# Patient Record
Sex: Female | Born: 1950 | Race: White | Hispanic: No | Smoking: Former smoker
Health system: Southern US, Community
[De-identification: ages and names within clinical notes are randomized; demographics above are authoritative.]

## PROBLEM LIST (undated history)

## (undated) DIAGNOSIS — M48062 Spinal stenosis, lumbar region with neurogenic claudication: Secondary | ICD-10-CM

## (undated) DIAGNOSIS — Z972 Presence of dental prosthetic device (complete) (partial): Secondary | ICD-10-CM

## (undated) DIAGNOSIS — L4050 Arthropathic psoriasis, unspecified: Secondary | ICD-10-CM

## (undated) DIAGNOSIS — H269 Unspecified cataract: Secondary | ICD-10-CM

## (undated) DIAGNOSIS — I2510 Atherosclerotic heart disease of native coronary artery without angina pectoris: Secondary | ICD-10-CM

## (undated) DIAGNOSIS — D649 Anemia, unspecified: Secondary | ICD-10-CM

## (undated) DIAGNOSIS — I700 Atherosclerosis of aorta: Secondary | ICD-10-CM

## (undated) DIAGNOSIS — E785 Hyperlipidemia, unspecified: Secondary | ICD-10-CM

## (undated) DIAGNOSIS — F419 Anxiety disorder, unspecified: Secondary | ICD-10-CM

## (undated) DIAGNOSIS — F32A Depression, unspecified: Secondary | ICD-10-CM

## (undated) DIAGNOSIS — I519 Heart disease, unspecified: Secondary | ICD-10-CM

## (undated) DIAGNOSIS — R718 Other abnormality of red blood cells: Secondary | ICD-10-CM

## (undated) DIAGNOSIS — Z79620 Long term (current) use of immunosuppressive biologic: Secondary | ICD-10-CM

## (undated) DIAGNOSIS — K589 Irritable bowel syndrome without diarrhea: Secondary | ICD-10-CM

## (undated) DIAGNOSIS — I5181 Takotsubo syndrome: Secondary | ICD-10-CM

## (undated) DIAGNOSIS — I10 Essential (primary) hypertension: Secondary | ICD-10-CM

## (undated) DIAGNOSIS — M1990 Unspecified osteoarthritis, unspecified site: Secondary | ICD-10-CM

## (undated) DIAGNOSIS — G4700 Insomnia, unspecified: Secondary | ICD-10-CM

## (undated) DIAGNOSIS — D569 Thalassemia, unspecified: Secondary | ICD-10-CM

## (undated) DIAGNOSIS — K219 Gastro-esophageal reflux disease without esophagitis: Secondary | ICD-10-CM

## (undated) DIAGNOSIS — E663 Overweight: Secondary | ICD-10-CM

## (undated) DIAGNOSIS — R42 Dizziness and giddiness: Secondary | ICD-10-CM

## (undated) DIAGNOSIS — R911 Solitary pulmonary nodule: Secondary | ICD-10-CM

## (undated) DIAGNOSIS — M2550 Pain in unspecified joint: Secondary | ICD-10-CM

## (undated) HISTORY — PX: TONSILLECTOMY: SUR1361

## (undated) HISTORY — DX: Hyperlipidemia, unspecified: E78.5

## (undated) HISTORY — DX: Essential (primary) hypertension: I10

## (undated) HISTORY — DX: Overweight: E66.3

## (undated) HISTORY — DX: Pain in unspecified joint: M25.50

## (undated) HISTORY — DX: Gastro-esophageal reflux disease without esophagitis: K21.9

## (undated) HISTORY — DX: Anemia, unspecified: D64.9

## (undated) HISTORY — DX: Other abnormality of red blood cells: R71.8

## (undated) HISTORY — DX: Thalassemia, unspecified: D56.9

## (undated) HISTORY — DX: Solitary pulmonary nodule: R91.1

## (undated) HISTORY — DX: Unspecified osteoarthritis, unspecified site: M19.90

## (undated) HISTORY — DX: Insomnia, unspecified: G47.00

## (undated) HISTORY — DX: Heart disease, unspecified: I51.9

## (undated) HISTORY — DX: Atherosclerotic heart disease of native coronary artery without angina pectoris: I25.10

---
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
## 1998-08-12 HISTORY — PX: CHOLECYSTECTOMY: SHX55

## 2005-08-12 HISTORY — PX: CARPAL TUNNEL RELEASE: SHX101

## 2008-08-12 HISTORY — PX: CORONARY ANGIOPLASTY WITH STENT PLACEMENT: SHX49

## 2011-08-13 HISTORY — PX: CATARACT EXTRACTION: SUR2

## 2014-02-08 LAB — HM PAP SMEAR

## 2014-02-21 HISTORY — PX: ESOPHAGOGASTRODUODENOSCOPY: SHX1529

## 2014-02-21 LAB — HM COLONOSCOPY

## 2014-03-11 ENCOUNTER — Ambulatory Visit: Payer: Self-pay | Admitting: Gastroenterology

## 2014-03-11 HISTORY — PX: COLONOSCOPY: SHX174

## 2015-05-16 DIAGNOSIS — I10 Essential (primary) hypertension: Secondary | ICD-10-CM | POA: Diagnosis not present

## 2015-05-16 DIAGNOSIS — E782 Mixed hyperlipidemia: Secondary | ICD-10-CM | POA: Diagnosis not present

## 2015-05-16 DIAGNOSIS — F411 Generalized anxiety disorder: Secondary | ICD-10-CM | POA: Diagnosis not present

## 2015-05-16 DIAGNOSIS — F5104 Psychophysiologic insomnia: Secondary | ICD-10-CM | POA: Diagnosis not present

## 2015-05-16 DIAGNOSIS — I2510 Atherosclerotic heart disease of native coronary artery without angina pectoris: Secondary | ICD-10-CM | POA: Diagnosis not present

## 2015-05-19 DIAGNOSIS — M189 Osteoarthritis of first carpometacarpal joint, unspecified: Secondary | ICD-10-CM | POA: Diagnosis not present

## 2015-05-19 DIAGNOSIS — M6588 Other synovitis and tenosynovitis, other site: Secondary | ICD-10-CM | POA: Diagnosis not present

## 2015-05-19 DIAGNOSIS — M1811 Unilateral primary osteoarthritis of first carpometacarpal joint, right hand: Secondary | ICD-10-CM | POA: Diagnosis not present

## 2015-06-02 DIAGNOSIS — Z23 Encounter for immunization: Secondary | ICD-10-CM | POA: Diagnosis not present

## 2015-06-09 ENCOUNTER — Ambulatory Visit: Payer: Self-pay | Admitting: Family Medicine

## 2015-06-12 ENCOUNTER — Ambulatory Visit: Payer: Self-pay | Admitting: Family Medicine

## 2015-06-12 DIAGNOSIS — E785 Hyperlipidemia, unspecified: Secondary | ICD-10-CM | POA: Insufficient documentation

## 2015-06-12 DIAGNOSIS — I2510 Atherosclerotic heart disease of native coronary artery without angina pectoris: Secondary | ICD-10-CM | POA: Insufficient documentation

## 2015-06-12 DIAGNOSIS — I10 Essential (primary) hypertension: Secondary | ICD-10-CM | POA: Insufficient documentation

## 2015-06-12 DIAGNOSIS — I519 Heart disease, unspecified: Secondary | ICD-10-CM | POA: Insufficient documentation

## 2015-06-12 DIAGNOSIS — M2550 Pain in unspecified joint: Secondary | ICD-10-CM | POA: Insufficient documentation

## 2015-06-12 DIAGNOSIS — G4700 Insomnia, unspecified: Secondary | ICD-10-CM | POA: Insufficient documentation

## 2015-06-12 DIAGNOSIS — D569 Thalassemia, unspecified: Secondary | ICD-10-CM | POA: Insufficient documentation

## 2015-06-14 ENCOUNTER — Encounter: Payer: Self-pay | Admitting: Family Medicine

## 2015-06-14 ENCOUNTER — Ambulatory Visit (INDEPENDENT_AMBULATORY_CARE_PROVIDER_SITE_OTHER): Payer: Medicare Other | Admitting: Family Medicine

## 2015-06-14 VITALS — BP 134/74 | HR 67 | Temp 98.7°F | Ht 62.0 in | Wt 156.0 lb

## 2015-06-14 DIAGNOSIS — D569 Thalassemia, unspecified: Secondary | ICD-10-CM

## 2015-06-14 DIAGNOSIS — I5181 Takotsubo syndrome: Secondary | ICD-10-CM

## 2015-06-14 DIAGNOSIS — R1013 Epigastric pain: Secondary | ICD-10-CM

## 2015-06-14 DIAGNOSIS — I10 Essential (primary) hypertension: Secondary | ICD-10-CM

## 2015-06-14 DIAGNOSIS — E785 Hyperlipidemia, unspecified: Secondary | ICD-10-CM

## 2015-06-14 DIAGNOSIS — Z5181 Encounter for therapeutic drug level monitoring: Secondary | ICD-10-CM | POA: Diagnosis not present

## 2015-06-14 DIAGNOSIS — G4700 Insomnia, unspecified: Secondary | ICD-10-CM | POA: Diagnosis not present

## 2015-06-14 DIAGNOSIS — I2510 Atherosclerotic heart disease of native coronary artery without angina pectoris: Secondary | ICD-10-CM | POA: Diagnosis not present

## 2015-06-14 DIAGNOSIS — R5383 Other fatigue: Secondary | ICD-10-CM | POA: Diagnosis not present

## 2015-06-14 NOTE — Patient Instructions (Addendum)
Do not take over-the-counter Prilosec or Prevacid (it will cause your clopidogrel to not work as well) Try Activia or similar yogurt Return for fasting labs in the next several days  If you need something for aches or pains, try to use Tylenol (acetaminphen) instead of non-steroidals (which include Aleve, ibuprofen, Advil, Motrin, and naproxen); non-steroidals can cause long-term kidney damage and heart attacks  Try to limit saturated fats in your diet (bologna, hot dogs, barbeque, cheeseburgers, hamburgers, steak, bacon, sausage, cheese, etc.) and get more fresh fruits, vegetables, and whole grains  Return the stool cards at your earliest convenience  Try to avoid spicy foods, peppermint, onions, orange juice, tomato-based foods  Try practicing yoga or other mindfulness for relaxation   Steps to Elicit the Relaxation Response The following is the technique reprinted with permission from Dr. Billie Ruddy book The Relaxation Response pages 162-163 1. Sit quietly in a comfortable position. 2. Close your eyes. 3. Deeply relax all your muscles,  beginning at your feet and progressing up to your face.  Keep them relaxed. 4. Breathe through your nose.  Become aware of your breathing.  As you breathe out, say the word, "one"*,  silently to yourself. For example,  breathe in ... out, "one",- in .. out, "one", etc.  Breathe easily and naturally. 5. Continue for 10 to 20 minutes.  You may open your eyes to check the time, but do not use an alarm.  When you finish, sit quietly for several minutes,  at first with your eyes closed and later with your eyes opened.  Do not stand up for a few minutes. 6. Do not worry about whether you are successful  in achieving a deep level of relaxation.  Maintain a passive attitude and permit relaxation to occur at its own pace.  When distracting thoughts occur,  try to ignore them by not dwelling upon them  and return to repeating "one."  With  practice, the response should come with little effort.  Practice the technique once or twice daily,  but not within two hours after any meal,  since the digestive processes seem to interfere with  the elicitation of the Relaxation Response. * It is better to use a soothing, mellifluous sound, preferably with no meaning. or association, to avoid stimulation of unnecessary thoughts - a mantra.

## 2015-06-14 NOTE — Progress Notes (Signed)
BP 134/74 mmHg  Pulse 67  Temp(Src) 98.7 F (37.1 C)  Ht 5\' 2"  (1.575 m)  Wt 156 lb (70.761 kg)  BMI 28.53 kg/m2  SpO2 97%   Subjective:    Patient ID: Lydia Elliott, female    DOB: 1951-03-06, 64 y.o.   MRN: [id number]  HPI: Lydia Elliott is a 64 y.o. female  Chief Complaint  Patient presents with  . GI Problem    She has thalassimia, has been having stomach issues, would like labs  . Immunizations    She is interested in the shingles and pneumonia vaccine  . OTHER    She has felt more tired lately but also has retired and wonders if it is boredom.   She wanted to get bloodwork to see if medicines are working; she just saw Dr. Ubaldo Glassing; May 16, 2015 note reviewed  Coronary artery disease; sees Dr. Ubaldo Glassing; had stent done in 2010; on dual anti-platelet therapy; has Takutsubo syndrome, cardiomyopathy, "octopus", slows heart rate when under stress, spills the enzyme like having a heart attack but no heart attack; small arteries  Her stomach always bothers her; has the runs, dreaded coming here; if she doesn't eat, she hurts right in the epigastrium; pretty stress-ridden person; certain foods bother her; drinks one or two alcohol drinks per week; no NSAIDs; no blood in stool; EGD and colonoscopy last year; no personal hx of ulcers, might have had ulcerative colitis 50 years ago; doesn't feel like burning; like having to go to the bathroom, comes and goes; she takes Zantac  High cholesterol; no more than 2 eggs; eating fatty foods; on statin  Relevant past medical, surgical, family and social history reviewed and updated as indicated. Interim medical history since our last visit reviewed. Allergies and medications reviewed and updated.  Social History  Substance Use Topics  . Smoking status: Never Smoker   . Smokeless tobacco: Never Used  . Alcohol Use: Yes     Comment: socially    Review of Systems  Per HPI unless specifically indicated above     Objective:    BP 134/74  mmHg  Pulse 67  Temp(Src) 98.7 F (37.1 C)  Ht 5\' 2"  (1.575 m)  Wt 156 lb (70.761 kg)  BMI 28.53 kg/m2  SpO2 97%  Wt Readings from Last 3 Encounters:  06/14/15 156 lb (70.761 kg)  05/23/14 159 lb (72.122 kg)    Physical Exam  Constitutional: She appears well-developed and well-nourished. No distress.  HENT:  Head: Normocephalic and atraumatic.  Eyes: EOM are normal. No scleral icterus.  Neck: No thyromegaly present.  Cardiovascular: Normal rate, regular rhythm and normal heart sounds.   No murmur heard. Pulmonary/Chest: Effort normal and breath sounds normal. No respiratory distress. She has no wheezes.  Abdominal: Soft. Bowel sounds are normal. She exhibits no distension. There is no tenderness. There is no guarding.  Musculoskeletal: Normal range of motion. She exhibits no edema.  Neurological: She is alert. She exhibits normal muscle tone.  Reflex Scores:      Patellar reflexes are 1+ on the right side and 1+ on the left side. Skin: Skin is warm and dry. She is not diaphoretic. No pallor.  Psychiatric: She has a normal mood and affect. Her behavior is normal. Judgment and thought content normal.   Results for orders placed or performed in visit on 06/12/15  HM PAP SMEAR  Result Value Ref Range   HM Pap smear per PP   HM COLONOSCOPY  Result Value Ref  Range   HM Colonoscopy per PP       Assessment & Plan:   Problem List Items Addressed This Visit      Cardiovascular and Mediastinum   Hypertension - Primary    DASH guidelines      CAD (coronary artery disease)    Under the care of cardiologist, Dr. Ubaldo Glassing; ideal LDL under 41      Takotsubo cardiomyopathy    Under the care of cardiologist, Dr. 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  Caution when calling on the phone, as she gets anxious  An after-visit summary was printed and given to the patient at [place].  Please see the patient instructions which may contain other information and recommendations beyond what is mentioned above in the assessment and plan.  Orders Placed This Encounter  Procedures  . H. pylori breath test  . CBC with Differential/Platelet  . Lipid Panel w/o Chol/HDL Ratio  . TSH  . Comprehensive metabolic panel

## 2015-06-14 NOTE — Assessment & Plan Note (Addendum)
Limit eggs, limit saturated fats; modest weight loss; check lipids; ideal LDL under 70 with her coronary artery disease; continue statin

## 2015-06-14 NOTE — Assessment & Plan Note (Addendum)
Patient unsure what type she has; will check CBC, as the patient wonders if anemia might cause fatigue

## 2015-06-14 NOTE — Assessment & Plan Note (Signed)
DASH guidelines 

## 2015-06-18 DIAGNOSIS — Z5181 Encounter for therapeutic drug level monitoring: Secondary | ICD-10-CM | POA: Insufficient documentation

## 2015-06-18 DIAGNOSIS — R1013 Epigastric pain: Secondary | ICD-10-CM | POA: Insufficient documentation

## 2015-06-18 DIAGNOSIS — I5181 Takotsubo syndrome: Secondary | ICD-10-CM | POA: Insufficient documentation

## 2015-06-18 NOTE — Assessment & Plan Note (Signed)
Under the care of cardiologist, Dr. Ubaldo Glassing; ideal LDL under 70

## 2015-06-18 NOTE — Assessment & Plan Note (Signed)
Check TSH, other labs

## 2015-06-18 NOTE — Assessment & Plan Note (Signed)
On zolpidem, prescribed by an outside prescriber

## 2015-06-18 NOTE — Assessment & Plan Note (Addendum)
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

## 2015-06-18 NOTE — Assessment & Plan Note (Signed)
Under the care of cardiologist, Dr. Ubaldo Glassing

## 2015-06-20 ENCOUNTER — Other Ambulatory Visit: Payer: Medicare Other

## 2015-06-20 DIAGNOSIS — R1013 Epigastric pain: Secondary | ICD-10-CM | POA: Diagnosis not present

## 2015-06-20 DIAGNOSIS — R5383 Other fatigue: Secondary | ICD-10-CM

## 2015-06-20 DIAGNOSIS — D569 Thalassemia, unspecified: Secondary | ICD-10-CM

## 2015-06-20 DIAGNOSIS — I2510 Atherosclerotic heart disease of native coronary artery without angina pectoris: Secondary | ICD-10-CM

## 2015-06-20 DIAGNOSIS — E785 Hyperlipidemia, unspecified: Secondary | ICD-10-CM

## 2015-06-20 DIAGNOSIS — Z5181 Encounter for therapeutic drug level monitoring: Secondary | ICD-10-CM

## 2015-06-21 LAB — COMPREHENSIVE METABOLIC PANEL
A/G RATIO: 2 (ref 1.1–2.5)
ALBUMIN: 4.2 g/dL (ref 3.6–4.8)
ALT: 12 IU/L (ref 0–32)
AST: 17 IU/L (ref 0–40)
Alkaline Phosphatase: 71 IU/L (ref 39–117)
BILIRUBIN TOTAL: 0.8 mg/dL (ref 0.0–1.2)
BUN / CREAT RATIO: 20 (ref 11–26)
BUN: 14 mg/dL (ref 8–27)
CHLORIDE: 104 mmol/L (ref 97–106)
CO2: 20 mmol/L (ref 18–29)
Calcium: 8.9 mg/dL (ref 8.7–10.3)
Creatinine, Ser: 0.71 mg/dL (ref 0.57–1.00)
GFR calc non Af Amer: 90 mL/min/{1.73_m2} (ref >59)
GFR, EST AFRICAN AMERICAN: 103 mL/min/{1.73_m2} (ref >59)
Globulin, Total: 2.1 g/dL (ref 1.5–4.5)
Glucose: 100 mg/dL — ABNORMAL HIGH (ref 65–99)
POTASSIUM: 4.5 mmol/L (ref 3.5–5.2)
SODIUM: 139 mmol/L (ref 136–144)
TOTAL PROTEIN: 6.3 g/dL (ref 6.0–8.5)

## 2015-06-21 LAB — TSH: TSH: 2.45 u[IU]/mL (ref 0.450–4.500)

## 2015-06-21 LAB — CBC WITH DIFFERENTIAL/PLATELET
Basophils Absolute: 0.1 10*3/uL (ref 0.0–0.2)
Basos: 1 %
EOS (ABSOLUTE): 0.1 10*3/uL (ref 0.0–0.4)
EOS: 2 %
HEMATOCRIT: 32.3 % — AB (ref 34.0–46.6)
Hemoglobin: 9.9 g/dL — ABNORMAL LOW (ref 11.1–15.9)
IMMATURE GRANS (ABS): 0 10*3/uL (ref 0.0–0.1)
IMMATURE GRANULOCYTES: 0 %
LYMPHS: 22 %
Lymphocytes Absolute: 1.1 10*3/uL (ref 0.7–3.1)
MCH: 20.5 pg — ABNORMAL LOW (ref 26.6–33.0)
MCHC: 30.7 g/dL — ABNORMAL LOW (ref 31.5–35.7)
MCV: 67 fL — ABNORMAL LOW (ref 79–97)
MONOS ABS: 0.4 10*3/uL (ref 0.1–0.9)
Monocytes: 7 %
NEUTROS PCT: 68 %
Neutrophils Absolute: 3.5 10*3/uL (ref 1.4–7.0)
PLATELETS: 215 10*3/uL (ref 150–379)
RBC: 4.84 x10E6/uL (ref 3.77–5.28)
RDW: 15.4 % (ref 12.3–15.4)
WBC: 5.1 10*3/uL (ref 3.4–10.8)

## 2015-06-21 LAB — LIPID PANEL W/O CHOL/HDL RATIO
Cholesterol, Total: 143 mg/dL (ref 100–199)
HDL: 60 mg/dL (ref >39)
LDL Calculated: 70 mg/dL (ref 0–99)
TRIGLYCERIDES: 66 mg/dL (ref 0–149)
VLDL CHOLESTEROL CAL: 13 mg/dL (ref 5–40)

## 2015-07-03 ENCOUNTER — Telehealth: Payer: Self-pay | Admitting: Family Medicine

## 2015-07-03 DIAGNOSIS — D569 Thalassemia, unspecified: Secondary | ICD-10-CM

## 2015-07-03 DIAGNOSIS — D649 Anemia, unspecified: Secondary | ICD-10-CM | POA: Insufficient documentation

## 2015-07-03 DIAGNOSIS — R7301 Impaired fasting glucose: Secondary | ICD-10-CM | POA: Insufficient documentation

## 2015-07-03 DIAGNOSIS — D6489 Other specified anemias: Secondary | ICD-10-CM

## 2015-07-03 NOTE — Telephone Encounter (Signed)
Pt would like a call back regarding lab results. 

## 2015-07-03 NOTE — Assessment & Plan Note (Signed)
Refer to heme

## 2015-07-03 NOTE — Telephone Encounter (Signed)
She has not had the H pylori breath test; she had the upper endoscopy and there was gastritis She has the stool cards and just hasn't had a chance to do those; will return those ASAP She has thalassemia; her mother had this too This hemoglobin is low for her; usually in the 10s; no dark stools; I don't want to hang my hat on the anemia from thalassemia, as she could be losing blood from ulcer or gastritis She is really tired; we'll have her see hematology to see if anything can be done to help boost that number; most folks aren't responsive to iron therapy, but I'd like to see if there is anything they can do Increase the ranitidine to BID I offered CT scan, refer back to upper endoscopy; she declined; reasons to go to ER reviewed I offered something for stress; increase escitalopram to 15 mg daily -------------------- AMY --> patient has not heard about the H pylori breath test; please notify her where to go for this ASAP

## 2015-07-03 NOTE — Telephone Encounter (Signed)
Routing to provider  

## 2015-07-05 NOTE — Telephone Encounter (Signed)
Patient given info and directions to [facility] drawn station for test.

## 2015-07-05 NOTE — Telephone Encounter (Signed)
Left message to call.

## 2015-07-11 ENCOUNTER — Other Ambulatory Visit: Payer: Self-pay

## 2015-07-11 ENCOUNTER — Ambulatory Visit: Payer: Medicare Other | Admitting: Family Medicine

## 2015-07-11 ENCOUNTER — Telehealth: Payer: Self-pay | Admitting: Family Medicine

## 2015-07-11 DIAGNOSIS — R1013 Epigastric pain: Secondary | ICD-10-CM

## 2015-07-11 DIAGNOSIS — D569 Thalassemia, unspecified: Secondary | ICD-10-CM

## 2015-07-11 DIAGNOSIS — R197 Diarrhea, unspecified: Secondary | ICD-10-CM

## 2015-07-11 DIAGNOSIS — R195 Other fecal abnormalities: Secondary | ICD-10-CM | POA: Insufficient documentation

## 2015-07-11 LAB — FECAL OCCULT BLOOD, GUAIAC
SPECIMEN 1: POSITIVE
SPECIMEN 2: NEGATIVE
SPECIMEN 3: POSITIVE

## 2015-07-11 MED ORDER — PANTOPRAZOLE SODIUM 40 MG PO TBEC: 40.0000 mg | DELAYED_RELEASE_TABLET | Freq: Every day | ORAL | Status: DC

## 2015-07-11 NOTE — Assessment & Plan Note (Signed)
Reviewed CBC from Dr. Ubaldo Glassing versus most recent CBC, numbers very similar, actually

## 2015-07-11 NOTE — Assessment & Plan Note (Signed)
Refer back to GI for heme positive stools (2 out of 3); increase from H2 blocker to PPI (on pantoprazole since she is on clopidogrel); discussed risk of ulcer, reasons to go to hospital

## 2015-07-11 NOTE — Telephone Encounter (Signed)
Blood in stool; I talked to patient about this by phone She has thalassemia; hemoglobin usually in the 10 range, now 9.9 She had egd and colonoscopy last year 2015 with Dr. Allen Norris; we'll have her get back in to see him Do listen to your body and go to ER or seek immediate care for bloody stools, worsening abd pain, etc. Avoid triggers, reviewed those in detail Start new pantoprazole; okay to quit H2 blocker (using this PPI since she is on clopidogrel)

## 2015-07-14 ENCOUNTER — Inpatient Hospital Stay: Payer: Medicare Other

## 2015-07-21 ENCOUNTER — Inpatient Hospital Stay: Payer: Medicare Other

## 2015-08-09 DIAGNOSIS — H26491 Other secondary cataract, right eye: Secondary | ICD-10-CM | POA: Diagnosis not present

## 2015-08-11 ENCOUNTER — Inpatient Hospital Stay: Payer: Medicare Other

## 2015-08-11 ENCOUNTER — Inpatient Hospital Stay: Payer: Medicare Other | Attending: Internal Medicine | Admitting: Internal Medicine

## 2015-08-11 VITALS — BP 164/85 | HR 67 | Temp 97.3°F | Resp 18 | Wt 153.7 lb

## 2015-08-11 DIAGNOSIS — R1013 Epigastric pain: Secondary | ICD-10-CM | POA: Insufficient documentation

## 2015-08-11 DIAGNOSIS — I10 Essential (primary) hypertension: Secondary | ICD-10-CM | POA: Diagnosis not present

## 2015-08-11 DIAGNOSIS — Z7982 Long term (current) use of aspirin: Secondary | ICD-10-CM | POA: Insufficient documentation

## 2015-08-11 DIAGNOSIS — K259 Gastric ulcer, unspecified as acute or chronic, without hemorrhage or perforation: Secondary | ICD-10-CM | POA: Diagnosis not present

## 2015-08-11 DIAGNOSIS — I2510 Atherosclerotic heart disease of native coronary artery without angina pectoris: Secondary | ICD-10-CM | POA: Insufficient documentation

## 2015-08-11 DIAGNOSIS — Z79899 Other long term (current) drug therapy: Secondary | ICD-10-CM | POA: Insufficient documentation

## 2015-08-11 DIAGNOSIS — D509 Iron deficiency anemia, unspecified: Secondary | ICD-10-CM | POA: Diagnosis not present

## 2015-08-11 DIAGNOSIS — E785 Hyperlipidemia, unspecified: Secondary | ICD-10-CM | POA: Insufficient documentation

## 2015-08-11 DIAGNOSIS — M1990 Unspecified osteoarthritis, unspecified site: Secondary | ICD-10-CM

## 2015-08-11 DIAGNOSIS — D649 Anemia, unspecified: Secondary | ICD-10-CM

## 2015-08-11 LAB — CBC WITH DIFFERENTIAL/PLATELET
Basophils Absolute: 0.1 10*3/uL (ref 0–0.1)
Basophils Relative: 1 %
EOS ABS: 0.1 10*3/uL (ref 0–0.7)
EOS PCT: 2 %
HCT: 35.3 % (ref 35.0–47.0)
Hemoglobin: 11.1 g/dL — ABNORMAL LOW (ref 12.0–16.0)
LYMPHS ABS: 1.5 10*3/uL (ref 1.0–3.6)
LYMPHS PCT: 23 %
MCH: 20.1 pg — AB (ref 26.0–34.0)
MCHC: 31.4 g/dL — AB (ref 32.0–36.0)
MCV: 64.2 fL — AB (ref 80.0–100.0)
MONO ABS: 0.5 10*3/uL (ref 0.2–0.9)
MONOS PCT: 7 %
Neutro Abs: 4.6 10*3/uL (ref 1.4–6.5)
Neutrophils Relative %: 67 %
PLATELETS: 251 10*3/uL (ref 150–440)
RBC: 5.51 MIL/uL — ABNORMAL HIGH (ref 3.80–5.20)
RDW: 15.1 % — ABNORMAL HIGH (ref 11.5–14.5)
WBC: 6.8 10*3/uL (ref 3.6–11.0)

## 2015-08-11 LAB — FERRITIN: FERRITIN: 303 ng/mL (ref 11–307)

## 2015-08-11 NOTE — Progress Notes (Addendum)
[place] @ [facility] Telephone:(336) [phone]  Fax:(336) [place] OB: 12/02/1950  MR#: [id number]  [id number]  Patient Care Team: Arnetha Courser, MD as PCP - General (Family Medicine)  CHIEF COMPLAINT: No chief complaint on file.    No history exists.   microcytic anemia  No flowsheet data found.  HISTORY OF PRESENT ILLNESS:   Lydia Elliott is a 64 year old lady[place] to our clinic for evaluation of microcytic anemia. Lydia Elliott claims that she has "thalassemia", which was diagnosed when she was pregnant with her child. She also claims that her grandmother, her mother and her granddaughter have been diagnosed with thalassemia, however she is not sure of what kind. Most recently, Lydia Elliott has been complaining of epigastric discomfort, and had FOBT was positive last month. Previously she was found to have gastric erosions on EGD, but colonoscopy 2 years ago was normal. She denies any invisible bleeding, change in the color of stool, weight loss, night sweats, lymphadenopathy, nausea, vomiting, diarrhea, dizziness, chest pain, shortness of breath.  REVIEW OF SYSTEMS:   Review of Systems  All other systems reviewed and are negative.    PAST MEDICAL HISTORY: Past Medical History  Diagnosis Date  . GERD (gastroesophageal reflux disease)   . Hypertension   . Hyperlipidemia   . Insomnia   . Polyarthralgia   . CAD (coronary artery disease)   . Thalassemia   . Heart disease     has a stent  . Arthritis     hands, knees, toes  . Chronic anemia   . Microcytosis     Hgb 10.9, MCV 66 April 2015    PAST SURGICAL HISTORY: Past Surgical History  Procedure Laterality Date  . Appendectomy  1967  . Tonsillectomy and adenoidectomy  1969  . Cholecystectomy  2000  . Carpal tunnel release  2007  . Coronary angioplasty with stent placement  2010  . Cataract extraction  2013  . Esophagogastroduodenoscopy   02/21/14    Dr.Wohl  . Colonoscopy  7/31//15    Dr.Wohl    FAMILY HISTORY Family History  Problem Relation Age of Onset  . Cirrhosis Mother   . Hepatitis Mother   . Thalassemia Mother   . Diabetes Mother   . Heart disease Father   . Diabetes Father   . Hyperlipidemia Father   . Hypertension Father   . Stroke Father   . Cancer Sister     lung  . Diabetes Sister   . Stroke Sister     during a surgery  . Diabetes Brother   . Heart disease Brother   . Hypertension Brother   . Stroke Brother   . Thalassemia Maternal Grandmother   . COPD Neg Hx     ADVANCED DIRECTIVES:  No flowsheet data found.  HEALTH MAINTENANCE: Social History  Substance Use Topics  . Smoking status: Never Smoker   . Smokeless tobacco: Never Used  . Alcohol Use: Yes     Comment: socially     No Known Allergies  Current Outpatient Prescriptions  Medication Sig Dispense Refill  . aspirin EC 81 MG tablet Take 81 mg by mouth daily.    . carvedilol (COREG) 12.5 MG tablet Take 12.5 mg by mouth 2 (two) times daily with a meal.    . clopidogrel (PLAVIX) 75 MG tablet Take 75 mg by mouth daily.    Marland Kitchen escitalopram (LEXAPRO) 10 MG tablet Take 15 mg by  mouth daily.    Marland Kitchen lisinopril (PRINIVIL,ZESTRIL) 2.5 MG tablet Take 2.5 mg by mouth daily.    . pantoprazole (PROTONIX) 40 MG tablet Take 1 tablet (40 mg total) by mouth daily. 30 tablet 1  . simvastatin (ZOCOR) 40 MG tablet Take 40 mg by mouth at bedtime.     Marland Kitchen zolpidem (AMBIEN) 10 MG tablet Take 5 mg by mouth at bedtime as needed.      No current facility-administered medications for this visit.    OBJECTIVE:  Filed Vitals:   08/11/15 1338  BP: 164/85  Pulse: 67  Temp: 97.3 F (36.3 C)  Resp: 18     Body mass index is 28.1 kg/(m^2).    ECOG FS:0 - Asymptomatic  Physical Exam  Constitutional: She is oriented to person, place, and time.  Age appropriate Lydia Elliott in no significant distress  HENT:  Head: Normocephalic and atraumatic.  Right  Ear: External ear normal.  Left Ear: External ear normal.  Mouth/Throat: Oropharynx is clear and moist.  Eyes: Conjunctivae are normal. Pupils are equal, round, and reactive to light. Right eye exhibits no discharge. Left eye exhibits no discharge. No scleral icterus.  Neck: Normal range of motion. Neck supple. No JVD present. No tracheal deviation present. No thyromegaly present.  Cardiovascular: Normal rate, regular rhythm, normal heart sounds and intact distal pulses.  Exam reveals no gallop and no friction rub.   No murmur heard. Pulmonary/Chest: Effort normal and breath sounds normal. No stridor. No respiratory distress. She has no wheezes. She has no rales. She exhibits no tenderness.  Abdominal: Soft. Bowel sounds are normal. She exhibits no distension and no mass. There is no tenderness. There is no rebound and no guarding.  Genitourinary:  Postponed  Musculoskeletal: Normal range of motion. She exhibits no edema or tenderness.  Lymphadenopathy:    She has no cervical adenopathy.  Neurological: She is alert and oriented to person, place, and time. She has normal reflexes. No cranial nerve deficit. She exhibits normal muscle tone. Gait normal. Coordination normal. GCS score is 15.  Skin: Skin is warm. No rash noted. She is not diaphoretic. No erythema. No pallor.  Psychiatric: Mood, memory, affect and judgment normal.  Nursing note and vitals reviewed.    LAB RESULTS: Recent Results (from the past 2160 hour(s))  CBC with Differential/Platelet     Status: Abnormal   Collection Time: 06/20/15  8:54 AM  Result Value Ref Range   WBC 5.1 3.4 - 10.8 x10E3/uL   RBC 4.84 3.77 - 5.28 x10E6/uL   Hemoglobin 9.9 (L) 11.1 - 15.9 g/dL   Hematocrit 32.3 (L) 34.0 - 46.6 %   MCV 67 (L) 79 - 97 fL   MCH 20.5 (L) 26.6 - 33.0 pg   MCHC 30.7 (L) 31.5 - 35.7 g/dL   RDW 15.4 12.3 - 15.4 %   Platelets 215 150 - 379 x10E3/uL   Neutrophils 68 %   Lymphs 22 %   Monocytes 7 %   Eos 2 %   Basos 1 %     Neutrophils Absolute 3.5 1.4 - 7.0 x10E3/uL   Lymphocytes Absolute 1.1 0.7 - 3.1 x10E3/uL   Monocytes Absolute 0.4 0.1 - 0.9 x10E3/uL   EOS (ABSOLUTE) 0.1 0.0 - 0.4 x10E3/uL   Basophils Absolute 0.1 0.0 - 0.2 x10E3/uL   Immature Granulocytes 0 %   Immature Grans (Abs) 0.0 0.0 - 0.1 x10E3/uL  Lipid Panel w/o Chol/HDL Ratio     Status: None   Collection Time:  06/20/15  8:54 AM  Result Value Ref Range   Cholesterol, Total 143 100 - 199 mg/dL   Triglycerides 66 0 - 149 mg/dL   HDL 60 >39 mg/dL    Comment: According to ATP-III Guidelines, HDL-C >59 mg/dL is considered a negative risk factor for CHD.    VLDL Cholesterol Cal 13 5 - 40 mg/dL   LDL Calculated 70 0 - 99 mg/dL  TSH     Status: None   Collection Time: 06/20/15  8:54 AM  Result Value Ref Range   TSH 2.450 0.450 - 4.500 uIU/mL  Comprehensive metabolic panel     Status: Abnormal   Collection Time: 06/20/15  8:54 AM  Result Value Ref Range   Glucose 100 (H) 65 - 99 mg/dL   BUN 14 8 - 27 mg/dL   Creatinine, Ser 0.71 0.57 - 1.00 mg/dL   GFR calc non Af Amer 90 >59 mL/min/1.73   GFR calc Af Amer 103 >59 mL/min/1.73   BUN/Creatinine Ratio 20 11 - 26   Sodium 139 136 - 144 mmol/L   Potassium 4.5 3.5 - 5.2 mmol/L   Chloride 104 97 - 106 mmol/L   CO2 20 18 - 29 mmol/L   Calcium 8.9 8.7 - 10.3 mg/dL   Total Protein 6.3 6.0 - 8.5 g/dL   Albumin 4.2 3.6 - 4.8 g/dL   Globulin, Total 2.1 1.5 - 4.5 g/dL   Albumin/Globulin Ratio 2.0 1.1 - 2.5   Bilirubin Total 0.8 0.0 - 1.2 mg/dL   Alkaline Phosphatase 71 39 - 117 IU/L   AST 17 0 - 40 IU/L   ALT 12 0 - 32 IU/L  [place] Stool Card-TAKE HOME     Status: None   Collection Time: 07/11/15 12:00 AM  Result Value Ref Range   Specimen 1 Pos    Specimen 2 Neg    Specimen 3 Pos          ASSESSMENT and MEDICAL DECISION MAKING:  Microcytic 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  Since there has not been a formal confirmation of the diagnosis documented in medical records, we will perform hemoglobin electrophoresis. Her ferritin levels are within normal range, and hemoglobin appears to be significantly  better than previously measured, so no additional evaluation, treatment or follow-up with hematology is required. If patient continues to have low-grade GI bleed, she should be treated with oral iron, if she is able to tolerate it. If, however, she were to develop intolerance of oral iron, then she can be referred to [place] for intravenous iron. Such a referral should be made only if the patient were to develop worsening of anemia with hemoglobin less than 9 g/dL and ferritin less than 20. Patient's baseline hemoglobin is reportedly between 10 and 11 g/dL.  Patient expressed understanding and was in agreement with this plan. She also understands that She can call clinic at any time with any questions, concerns, or complaints.    No matching staging information was found for the patient.  Roxana Hires, MD   08/11/2015 1:13 PM

## 2015-08-11 NOTE — Progress Notes (Signed)
Patient here as new evaluation for Thalassemia. Referred by Dr Sanda Klein.  Patient states she had an endoscopy one year ago that revealed errosion in the top of her stomach. About a month and a half ago she had quiac testing and 2 out of 3 revealed microscopic blood.  She was taking Zantac at that time for discomfort in the epigastric area.  Dr. Sanda Klein changed her from Zantac to Omeprazole and the discomfort in her epigastric area has stopped.  She states she has had no more pain since that time. She further states she has eliminated spicy foods from her diet.

## 2015-08-15 LAB — HEMOGLOBINOPATHY EVALUATION
HGB A2 QUANT: 5 % — AB (ref 0.7–3.1)
HGB F QUANT: 0 % (ref 0.0–2.0)
HGB S QUANTITAION: 0 %
Hgb A: 95 % (ref 94.0–98.0)
Hgb C: 0 %

## 2015-08-17 ENCOUNTER — Ambulatory Visit: Payer: Medicare Other | Admitting: Gastroenterology

## 2015-08-28 DIAGNOSIS — Z961 Presence of intraocular lens: Secondary | ICD-10-CM | POA: Diagnosis not present

## 2015-08-28 DIAGNOSIS — H26491 Other secondary cataract, right eye: Secondary | ICD-10-CM | POA: Diagnosis not present

## 2015-08-28 DIAGNOSIS — H04123 Dry eye syndrome of bilateral lacrimal glands: Secondary | ICD-10-CM | POA: Diagnosis not present

## 2015-08-28 DIAGNOSIS — H35033 Hypertensive retinopathy, bilateral: Secondary | ICD-10-CM | POA: Diagnosis not present

## 2015-09-19 ENCOUNTER — Telehealth: Payer: Self-pay | Admitting: Family Medicine

## 2015-09-19 ENCOUNTER — Ambulatory Visit: Payer: Medicare Other | Admitting: Gastroenterology

## 2015-09-19 NOTE — Telephone Encounter (Signed)
Chart shows that we don't have results of her H pylori breath test Please get that re-ordered / completed as soon as possible please, unless GI has taken over and is seeing her

## 2015-09-21 NOTE — Telephone Encounter (Signed)
Left message to call.

## 2015-09-21 NOTE — Telephone Encounter (Signed)
She just has not had a chance to get it done yet. Had a family emergency and had to go out of town. She will call and get it scheduled.

## 2015-11-27 ENCOUNTER — Ambulatory Visit (INDEPENDENT_AMBULATORY_CARE_PROVIDER_SITE_OTHER): Payer: Medicare Other | Admitting: Family Medicine

## 2015-11-27 ENCOUNTER — Other Ambulatory Visit: Payer: Self-pay

## 2015-11-27 ENCOUNTER — Telehealth: Payer: Self-pay | Admitting: Family Medicine

## 2015-11-27 ENCOUNTER — Telehealth: Payer: Self-pay

## 2015-11-27 ENCOUNTER — Encounter: Payer: Self-pay | Admitting: Family Medicine

## 2015-11-27 VITALS — BP 126/78 | HR 86 | Temp 98.2°F | Resp 14 | Wt 149.0 lb

## 2015-11-27 DIAGNOSIS — R195 Other fecal abnormalities: Secondary | ICD-10-CM

## 2015-11-27 DIAGNOSIS — D569 Thalassemia, unspecified: Secondary | ICD-10-CM | POA: Diagnosis not present

## 2015-11-27 DIAGNOSIS — I10 Essential (primary) hypertension: Secondary | ICD-10-CM | POA: Diagnosis not present

## 2015-11-27 DIAGNOSIS — R1013 Epigastric pain: Secondary | ICD-10-CM

## 2015-11-27 DIAGNOSIS — F418 Other specified anxiety disorders: Secondary | ICD-10-CM

## 2015-11-27 DIAGNOSIS — E663 Overweight: Secondary | ICD-10-CM

## 2015-11-27 MED ORDER — LORAZEPAM 0.5 MG PO TABS: 0.5000 mg | ORAL_TABLET | Freq: Two times a day (BID) | ORAL | Status: DC | PRN

## 2015-11-27 MED ORDER — PANTOPRAZOLE SODIUM 40 MG PO TBEC: 40.0000 mg | DELAYED_RELEASE_TABLET | Freq: Every day | ORAL | Status: DC

## 2015-11-27 NOTE — Progress Notes (Signed)
BP 126/78 mmHg  Pulse 86  Temp(Src) 98.2 F (36.8 C) (Oral)  Resp 14  Wt 149 lb (67.586 kg)  SpO2 96%   Subjective:    Patient ID: Lydia Elliott, female    DOB: November 26, 1950, 65 y.o.   MRN: [id number]  HPI: Lydia Elliott is a 65 y.o. female  Chief Complaint  Patient presents with  . Blood In Stools    3 episodes.  noticed blood in the toliet bowel as well as on toliet paper.   She will have episodes of diarrhea, getting sweaty, stools run right through her; lasts 40 minutes; usually follows no stool for 4-5 days, then that happens; this episode though was different; first time with blood; string of blood; she looked in the toilet, saw brown and a little red; then wiped herself and saw red on toilet paper; three times today total stooling, and only first one had little bit of BRBPR; not tarry stools; no antibiotics in the last 2 months; strong smell to stool; has changed diet quite a bit, eating a lot of fruit; no fever at home; last colonoscopy 2 years ago and they told her to come back in 10 years; hx of hemorrhoids (external) Always has anemia, saw hematologist, nothing to worry about; 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 Ubaldo Glassing, just 5 mg; most nights  Depression screen [facility] 2/9 11/27/2015  Decreased Interest 0  Down, Depressed, Hopeless 0  PHQ - 2 Score 0   Relevant past medical, surgical, family and social history reviewed Past Medical History  Diagnosis Date  . GERD (gastroesophageal reflux disease)   . Hypertension   . Hyperlipidemia   . Insomnia   . Polyarthralgia   . CAD  (coronary artery disease)   . Thalassemia   . Heart disease     has a stent  . Arthritis     hands, knees, toes  . Chronic anemia   . Microcytosis     Hgb 10.9, MCV 66 April 2015  . Wears dentures     partial lower  . Vertigo     none - 10 yrs  . Overweight (BMI 25.0-29.9) 12/23/2015  Past Surg Hx Appy, T&A, choly, coronary artery stent, EGD and colonoscopy July 2015; cataract extraction, carpal tunnel release  Interim medical history since our last visit reviewed. Allergies and medications reviewed  Review of Systems Per HPI unless specifically indicated above Weight loss intentional since November; has done Weight Watchers     Objective:    BP 126/78 mmHg  Pulse 86  Temp(Src) 98.2 F (36.8 C) (Oral)  Resp 14  Wt 149 lb (67.586 kg)  SpO2 96%  Wt Readings from Last 3 Encounters:  12/11/15 146 lb (66.225 kg)  11/27/15 149 lb (67.586 kg)  08/11/15 153 lb 10.6 oz (69.7 kg)   Body mass index is 27.25 kg/(m^2).  Physical Exam  Constitutional: She appears well-developed and well-nourished. No distress.  Weight down 4 pounds over 4-1/2 months  HENT:  Head: Normocephalic and atraumatic.  Eyes: EOM are normal. No scleral icterus.  Neck: No  thyromegaly present.  Cardiovascular: Normal rate, regular rhythm and normal heart sounds.   No murmur heard. Pulmonary/Chest: Effort normal and breath sounds normal. No respiratory distress. She has no wheezes.  Abdominal: Soft. Bowel sounds are normal. She exhibits no distension. There is no tenderness. There is no guarding.  Musculoskeletal: Normal range of motion. She exhibits no edema.  Neurological: She is alert. She exhibits normal muscle tone.  Skin: Skin is warm and dry. She is not diaphoretic. No pallor.  Psychiatric: She has a normal mood and affect. Her behavior is normal. Judgment and thought content normal.      Assessment & Plan:   Problem List Items Addressed This Visit      Cardiovascular and Mediastinum    Hypertension    Controlled today        Other   Abdominal pain, epigastric    advised that since she is on plavix, she needs to stop her current PPI and only use pantoprazole      Anxiety about health    Patient has significant anxiety; will allow very limited amount of anxiolytic; do NOT take with six hours of zolpidem, and do not take with any alcohol      Relevant Medications   LORazepam (ATIVAN) 0.5 MG tablet   Guaiac positive stools - Primary    Patient to have colonoscopy soon      Overweight (BMI 25.0-29.9)    Patient losing weight intentionally she reports; consider checking thyroid tests if ongoing weight loss and/or worsening anxiety      Thalassemia    Chronic, noted         Follow up plan: Return if symptoms worsen or fail to improve.  An after-visit summary was printed and given to the patient at [place].  Please see the patient instructions which may contain other information and recommendations beyond what is mentioned above in the assessment and plan.  Meds ordered this encounter  Medications  . Probiotic Product (PRO-BIOTIC BLEND PO)    Sig: Take by mouth.  Marland Kitchen LORazepam (ATIVAN) 0.5 MG tablet    Sig: Take 1 tablet (0.5 mg total) by mouth 2 (two) times daily as needed for anxiety. Do not take within 6 hours of the zolpidem; no alcohol    Dispense:  10 tablet    Refill:  0  . pantoprazole (PROTONIX) 40 MG tablet    Sig: Take 1 tablet (40 mg total) by mouth daily.    Dispense:  30 tablet    Refill:  1    *patient is on Plavix; do NOT substitute with another PPI please*

## 2015-11-27 NOTE — Telephone Encounter (Signed)
Requesting a return call. She is having rectal bleeding problems and stated that you where going to refer her to do a colonoscopy. Stated that she received a call about a endoscopy. No appointments available today.

## 2015-11-27 NOTE — Patient Instructions (Addendum)
Try taking Miralax daily to have regular soft bowel movements Keep the appointment with gastroenterologist May 1st for your procedures Use the anxiety medicine prior to flight or if really stressed, but never take within six hours of zolpidem (Ambien) or alcohol or pain medicine If your symptoms recur, call me or go to the urgent care or ER depending on severity Call me if needed STOP omeprazole, and use pantoprazole instead

## 2015-11-27 NOTE — Telephone Encounter (Signed)
Gastroenterology Pre-Procedure Review  Request Date: 12/11/15 Requesting Physician: Dr. Enid Derry  PATIENT REVIEW QUESTIONS: The patient responded to the following health history questions as indicated:    1. Are you having any GI issues? yes (Rectal bleeding) 2. Do you have a personal history of Polyps? no 3. Do you have a family history of Colon Cancer or Polyps? no 4. Diabetes Mellitus? no 5. Joint replacements in the past 12 months?no 6. Major health problems in the past 3 months?no 7. Any artificial heart valves, MVP, or defibrillator?yes (Stent x 10 years)    MEDICATIONS & ALLERGIES:    Patient reports the following regarding taking any anticoagulation/antiplatelet therapy:   Plavix, Coumadin, Eliquis, Xarelto, Lovenox, Pradaxa, Brilinta, or Effient? yes (Plavix 75mg ) Aspirin? yes (ASA 81mg )  Patient confirms/reports the following medications:  Current Outpatient Prescriptions  Medication Sig Dispense Refill  . aspirin EC 81 MG tablet Take 81 mg by mouth daily.    . carvedilol (COREG) 12.5 MG tablet Take 12.5 mg by mouth 2 (two) times daily with a meal.    . clopidogrel (PLAVIX) 75 MG tablet Take 75 mg by mouth daily.    Marland Kitchen escitalopram (LEXAPRO) 10 MG tablet Take 15 mg by mouth daily.    Marland Kitchen lactobacillus acidophilus (BACID) TABS tablet Take 2 tablets by mouth daily.    Marland Kitchen lisinopril (PRINIVIL,ZESTRIL) 2.5 MG tablet Take 2.5 mg by mouth daily.    Marland Kitchen omeprazole (PRILOSEC) 20 MG capsule Take 20 mg by mouth 2 (two) times daily.    . pantoprazole (PROTONIX) 40 MG tablet Take 1 tablet (40 mg total) by mouth daily. 30 tablet 1  . simvastatin (ZOCOR) 40 MG tablet Take 40 mg by mouth at bedtime.     . TURMERIC PO Take by mouth daily.    Marland Kitchen zolpidem (AMBIEN) 10 MG tablet Take 5 mg by mouth at bedtime as needed.      No current facility-administered medications for this visit.    Patient confirms/reports the following allergies:  No Known Allergies  No orders of the defined types  were placed in this encounter.    AUTHORIZATION INFORMATION Primary Insurance: 1D#: Group #:  Secondary Insurance: 1D#: Group #:  SCHEDULE INFORMATION: Date: 12/11/15 Time: Location: [place]

## 2015-12-05 ENCOUNTER — Encounter: Payer: Self-pay | Admitting: *Deleted

## 2015-12-08 NOTE — Discharge Instructions (Signed)

## 2015-12-11 ENCOUNTER — Ambulatory Visit: Payer: Medicare Other | Admitting: Student in an Organized Health Care Education/Training Program

## 2015-12-11 ENCOUNTER — Ambulatory Visit
Admission: RE | Admit: 2015-12-11 | Discharge: 2015-12-11 | Disposition: A | Discharge status: Home / Self Care | Payer: Medicare Other | Source: Ambulatory Visit | Attending: Gastroenterology | Admitting: Gastroenterology

## 2015-12-11 ENCOUNTER — Encounter
Admission: RE | Disposition: A | Discharge status: Home / Self Care | Payer: Self-pay | Source: Ambulatory Visit | Attending: Gastroenterology

## 2015-12-11 DIAGNOSIS — Z8379 Family history of other diseases of the digestive system: Secondary | ICD-10-CM | POA: Insufficient documentation

## 2015-12-11 DIAGNOSIS — M170 Bilateral primary osteoarthritis of knee: Secondary | ICD-10-CM | POA: Insufficient documentation

## 2015-12-11 DIAGNOSIS — Z9889 Other specified postprocedural states: Secondary | ICD-10-CM | POA: Insufficient documentation

## 2015-12-11 DIAGNOSIS — Z9049 Acquired absence of other specified parts of digestive tract: Secondary | ICD-10-CM | POA: Insufficient documentation

## 2015-12-11 DIAGNOSIS — M19042 Primary osteoarthritis, left hand: Secondary | ICD-10-CM | POA: Insufficient documentation

## 2015-12-11 DIAGNOSIS — I2510 Atherosclerotic heart disease of native coronary artery without angina pectoris: Secondary | ICD-10-CM | POA: Diagnosis not present

## 2015-12-11 DIAGNOSIS — Z823 Family history of stroke: Secondary | ICD-10-CM | POA: Diagnosis not present

## 2015-12-11 DIAGNOSIS — I10 Essential (primary) hypertension: Secondary | ICD-10-CM | POA: Insufficient documentation

## 2015-12-11 DIAGNOSIS — K625 Hemorrhage of anus and rectum: Secondary | ICD-10-CM | POA: Insufficient documentation

## 2015-12-11 DIAGNOSIS — Z8249 Family history of ischemic heart disease and other diseases of the circulatory system: Secondary | ICD-10-CM | POA: Diagnosis not present

## 2015-12-11 DIAGNOSIS — R1013 Epigastric pain: Secondary | ICD-10-CM | POA: Diagnosis not present

## 2015-12-11 DIAGNOSIS — Z7982 Long term (current) use of aspirin: Secondary | ICD-10-CM | POA: Insufficient documentation

## 2015-12-11 DIAGNOSIS — Z955 Presence of coronary angioplasty implant and graft: Secondary | ICD-10-CM | POA: Insufficient documentation

## 2015-12-11 DIAGNOSIS — Z801 Family history of malignant neoplasm of trachea, bronchus and lung: Secondary | ICD-10-CM | POA: Diagnosis not present

## 2015-12-11 DIAGNOSIS — M19072 Primary osteoarthritis, left ankle and foot: Secondary | ICD-10-CM | POA: Diagnosis not present

## 2015-12-11 DIAGNOSIS — K449 Diaphragmatic hernia without obstruction or gangrene: Secondary | ICD-10-CM | POA: Insufficient documentation

## 2015-12-11 DIAGNOSIS — M19041 Primary osteoarthritis, right hand: Secondary | ICD-10-CM | POA: Insufficient documentation

## 2015-12-11 DIAGNOSIS — K921 Melena: Secondary | ICD-10-CM | POA: Diagnosis not present

## 2015-12-11 DIAGNOSIS — D569 Thalassemia, unspecified: Secondary | ICD-10-CM | POA: Insufficient documentation

## 2015-12-11 DIAGNOSIS — K641 Second degree hemorrhoids: Secondary | ICD-10-CM | POA: Diagnosis not present

## 2015-12-11 DIAGNOSIS — K635 Polyp of colon: Secondary | ICD-10-CM | POA: Insufficient documentation

## 2015-12-11 DIAGNOSIS — E785 Hyperlipidemia, unspecified: Secondary | ICD-10-CM | POA: Diagnosis not present

## 2015-12-11 DIAGNOSIS — D124 Benign neoplasm of descending colon: Secondary | ICD-10-CM | POA: Insufficient documentation

## 2015-12-11 DIAGNOSIS — K219 Gastro-esophageal reflux disease without esophagitis: Secondary | ICD-10-CM | POA: Insufficient documentation

## 2015-12-11 DIAGNOSIS — Z833 Family history of diabetes mellitus: Secondary | ICD-10-CM | POA: Diagnosis not present

## 2015-12-11 DIAGNOSIS — M19071 Primary osteoarthritis, right ankle and foot: Secondary | ICD-10-CM | POA: Insufficient documentation

## 2015-12-11 HISTORY — PX: POLYPECTOMY: SHX5525

## 2015-12-11 HISTORY — PX: COLONOSCOPY WITH PROPOFOL: SHX5780

## 2015-12-11 HISTORY — PX: ESOPHAGOGASTRODUODENOSCOPY (EGD) WITH PROPOFOL: SHX5813

## 2015-12-11 HISTORY — DX: Presence of dental prosthetic device (complete) (partial): Z97.2

## 2015-12-11 HISTORY — DX: Dizziness and giddiness: R42

## 2015-12-11 SURGERY — COLONOSCOPY WITH PROPOFOL
Anesthesia: Monitor Anesthesia Care | Site: Throat | Wound class: Dirty or Infected

## 2015-12-11 MED ORDER — ACETAMINOPHEN 325 MG PO TABS: 325.0000 mg | ORAL_TABLET | ORAL | Status: DC | PRN

## 2015-12-11 MED ORDER — STERILE WATER FOR IRRIGATION IR SOLN
Status: DC | PRN
  Administered 2015-12-11: 12:00:00

## 2015-12-11 MED ORDER — LACTATED RINGERS IV SOLN
INTRAVENOUS | Status: DC
  Administered 2015-12-11: 10:00:00 via INTRAVENOUS

## 2015-12-11 MED ORDER — PROPOFOL 10 MG/ML IV BOLUS
INTRAVENOUS | Status: DC | PRN
  Administered 2015-12-11: 30 mg via INTRAVENOUS
  Administered 2015-12-11: 20 mg via INTRAVENOUS
  Administered 2015-12-11: 30 mg via INTRAVENOUS
  Administered 2015-12-11 (×2): 20 mg via INTRAVENOUS
  Administered 2015-12-11: 30 mg via INTRAVENOUS
  Administered 2015-12-11 (×2): 20 mg via INTRAVENOUS
  Administered 2015-12-11: 30 mg via INTRAVENOUS
  Administered 2015-12-11: 70 mg via INTRAVENOUS
  Administered 2015-12-11 (×3): 20 mg via INTRAVENOUS
  Administered 2015-12-11: 30 mg via INTRAVENOUS
  Administered 2015-12-11 (×5): 20 mg via INTRAVENOUS

## 2015-12-11 MED ORDER — LIDOCAINE HCL (CARDIAC) 20 MG/ML IV SOLN
INTRAVENOUS | Status: DC | PRN
  Administered 2015-12-11: 40 mg via INTRAVENOUS

## 2015-12-11 MED ORDER — GLYCOPYRROLATE 0.2 MG/ML IJ SOLN
INTRAMUSCULAR | Status: DC | PRN
  Administered 2015-12-11: 0.2 mg via INTRAVENOUS

## 2015-12-11 MED ORDER — ACETAMINOPHEN 160 MG/5ML PO SOLN
325.0000 mg | ORAL | Status: DC | PRN
Start: 2015-12-11 — End: 2015-12-11

## 2015-12-11 MED ORDER — SODIUM CHLORIDE 0.9 % IV SOLN: INTRAVENOUS | Status: DC

## 2015-12-11 SURGICAL SUPPLY — 33 items
BALLN DILATOR 10-12 8 (BALLOONS)
BALLN DILATOR 12-15 8 (BALLOONS)
BALLN DILATOR 15-18 8 (BALLOONS)
BALLN DILATOR CRE 0-12 8 (BALLOONS)
BALLN DILATOR ESOPH 8 10 CRE (MISCELLANEOUS) IMPLANT
BALLOON DILATOR 12-15 8 (BALLOONS) IMPLANT
BALLOON DILATOR 15-18 8 (BALLOONS) IMPLANT
BALLOON DILATOR CRE 0-12 8 (BALLOONS) IMPLANT
BLOCK BITE 60FR ADLT L/F GRN (MISCELLANEOUS) ×4 IMPLANT
CANISTER SUCT 1200ML W/VALVE (MISCELLANEOUS) ×4 IMPLANT
CLIP HMST 235XBRD CATH ROT (MISCELLANEOUS) IMPLANT
CLIP RESOLUTION 360 11X235 (MISCELLANEOUS)
FCP ESCP3.2XJMB 240X2.8X (MISCELLANEOUS)
FORCEPS BIOP RAD 4 LRG CAP 4 (CUTTING FORCEPS) ×4 IMPLANT
FORCEPS BIOP RJ4 240 W/NDL (MISCELLANEOUS)
FORCEPS ESCP3.2XJMB 240X2.8X (MISCELLANEOUS) IMPLANT
GOWN CVR UNV OPN BCK APRN NK (MISCELLANEOUS) ×4 IMPLANT
GOWN ISOL THUMB LOOP REG UNIV (MISCELLANEOUS) ×4
INJECTOR VARIJECT VIN23 (MISCELLANEOUS) IMPLANT
KIT DEFENDO VALVE AND CONN (KITS) IMPLANT
KIT ENDO PROCEDURE OLY (KITS) ×4 IMPLANT
MARKER SPOT ENDO TATTOO 5ML (MISCELLANEOUS) IMPLANT
PAD GROUND ADULT SPLIT (MISCELLANEOUS) IMPLANT
PROBE APC STR FIRE (PROBE) IMPLANT
SNARE SHORT THROW 13M SML OVAL (MISCELLANEOUS) IMPLANT
SNARE SHORT THROW 30M LRG OVAL (MISCELLANEOUS) IMPLANT
SNARE SNG USE RND 15MM (INSTRUMENTS) IMPLANT
SPOT EX ENDOSCOPIC TATTOO (MISCELLANEOUS)
SYR INFLATION 60ML (SYRINGE) IMPLANT
TRAP ETRAP POLY (MISCELLANEOUS) IMPLANT
VARIJECT INJECTOR VIN23 (MISCELLANEOUS)
WATER STERILE IRR 250ML POUR (IV SOLUTION) ×4 IMPLANT
WIRE CRE 18-20MM 8CM F G (MISCELLANEOUS) IMPLANT

## 2015-12-11 NOTE — Anesthesia Preprocedure Evaluation (Signed)
Anesthesia Evaluation  Patient identified by MRN, date of birth, ID band  Reviewed: Allergy & Precautions, H&P , NPO status , Patient's Chart, lab work & pertinent test results  Airway Mallampati: II  TM Distance: >3 FB Neck ROM: full    Dental no notable dental hx.    Pulmonary    Pulmonary exam normal        Cardiovascular hypertension, + CAD   Rhythm:regular Rate:Normal     Neuro/Psych    GI/Hepatic GERD  ,  Endo/Other    Renal/GU      Musculoskeletal   Abdominal   Peds  Hematology   Anesthesia Other Findings   Reproductive/Obstetrics                             Anesthesia Physical Anesthesia Plan  ASA: II  Anesthesia Plan: MAC   Post-op Pain Management:    Induction:   Airway Management Planned:   Additional Equipment:   Intra-op Plan:   Post-operative Plan:   Informed Consent: I have reviewed the patients History and Physical, chart, labs and discussed the procedure including the risks, benefits and alternatives for the proposed anesthesia with the patient or authorized representative who has indicated his/her understanding and acceptance.     Plan Discussed with: CRNA  Anesthesia Plan Comments:         Anesthesia Quick Evaluation

## 2015-12-11 NOTE — Anesthesia Procedure Notes (Signed)
Procedure Name: MAC Performed by: Dafina Suk Pre-anesthesia Checklist: Patient identified, Emergency Drugs available, Suction available, Timeout performed and Patient being monitored Patient Re-evaluated:Patient Re-evaluated prior to inductionOxygen Delivery Method: Nasal cannula Placement Confirmation: positive ETCO2       

## 2015-12-11 NOTE — H&P (Signed)
[facility]  [address], [place], [place] [address] Phone: [phone] Fax : [phone]  Primary Care Physician:  Enid Derry, MD Primary Gastroenterologist:  Dr. Allen Norris  Pre-Procedure History & Physical: HPI:  Lydia Elliott is a 65 y.o. female is here for an endoscopy and colonoscopy.   Past Medical History  Diagnosis Date  . GERD (gastroesophageal reflux disease)   . Hypertension   . Hyperlipidemia   . Insomnia   . Polyarthralgia   . CAD (coronary artery disease)   . Thalassemia   . Heart disease     has a stent  . Arthritis     hands, knees, toes  . Chronic anemia   . Microcytosis     Hgb 10.9, MCV 66 April 2015  . Wears dentures     partial lower  . Vertigo     none - 10 yrs    Past Surgical History  Procedure Laterality Date  . Appendectomy  1967  . Tonsillectomy and adenoidectomy  1969  . Cholecystectomy  2000  . Carpal tunnel release  2007  . Coronary angioplasty with stent placement  2010  . Cataract extraction  2013  . Esophagogastroduodenoscopy  02/21/14    Dr.Yanelli Zapanta  . Colonoscopy  7/31//15    Dr.Daveigh Batty    Prior to Admission medications   Medication Sig Start Date End Date Taking? Authorizing Provider  aspirin EC 81 MG tablet Take 81 mg by mouth daily.   Yes Historical Provider, MD  carvedilol (COREG) 12.5 MG tablet Take 12.5 mg by mouth 2 (two) times daily with a meal.   Yes Historical Provider, MD  clopidogrel (PLAVIX) 75 MG tablet Take 75 mg by mouth daily.   Yes Historical Provider, MD  escitalopram (LEXAPRO) 10 MG tablet Take 10 mg by mouth daily. 05/16/15  Yes Historical Provider, MD  lisinopril (PRINIVIL,ZESTRIL) 2.5 MG tablet Take 2.5 mg by mouth 2 (two) times daily.    Yes Historical Provider, MD  LORazepam (ATIVAN) 0.5 MG tablet Take 1 tablet (0.5 mg total) by mouth 2 (two) times daily as needed for anxiety. Do not take within 6 hours of the zolpidem; no alcohol 11/27/15  Yes Arnetha Courser, MD  pantoprazole (PROTONIX)  40 MG tablet Take 1 tablet (40 mg total) by mouth daily. 11/27/15  Yes Arnetha Courser, MD  Probiotic Product (PRO-BIOTIC BLEND PO) Take by mouth.   Yes Historical Provider, MD  simvastatin (ZOCOR) 40 MG tablet Take 40 mg by mouth at bedtime.    Yes Historical Provider, MD  TURMERIC PO Take by mouth daily.   Yes Historical Provider, MD  zolpidem (AMBIEN) 10 MG tablet Take 5 mg by mouth at bedtime as needed.  05/19/15  Yes Historical Provider, MD    Allergies as of 11/27/2015  . (No Known Allergies)    Family History  Problem Relation Age of Onset  . Cirrhosis Mother   . Hepatitis Mother   . Thalassemia Mother   . Diabetes Mother   . Heart disease Father   . Diabetes Father   . Hyperlipidemia Father   . Hypertension Father   . Stroke Father   . Cancer Sister     lung  . Diabetes Sister   . Stroke Sister     during a surgery  . Diabetes Brother   . Heart disease Brother   . Hypertension Brother   . Stroke Brother   . Thalassemia Maternal Grandmother   . COPD Neg Hx  Social History   Social History  . Marital Status: Married    Spouse Name: N/A  . Number of Children: N/A  . Years of Education: N/A   Occupational History  . Not on file.   Social History Main Topics  . Smoking status: Never Smoker   . Smokeless tobacco: Never Used  . Alcohol Use: 0.6 oz/week    1 Shots of liquor per week     Comment: socially  . Drug Use: No  . Sexual Activity: Not on file   Other Topics Concern  . Not on file   Social History Narrative    Review of Systems: See HPI, otherwise negative ROS  Physical Exam: BP 162/65 mmHg  Pulse 71  Temp(Src) 96.7 F (35.9 C) (Temporal)  Resp 16  Ht 5\' 2"  (1.575 m)  Wt 146 lb (66.225 kg)  BMI 26.70 kg/m2  SpO2 100% General:   Alert,  pleasant and cooperative in NAD Head:  Normocephalic and atraumatic. Neck:  Supple; no masses or thyromegaly. Lungs:  Clear throughout to auscultation.    Heart:  Regular rate and rhythm. Abdomen:   Soft, nontender and nondistended. Normal bowel sounds, without guarding, and without rebound.   Neurologic:  Alert and  oriented x4;  grossly normal neurologically.  Impression/Plan: [name] is here for an endoscopy and colonoscopy to be performed for BRBPR and Epigastric pain.  Risks, benefits, limitations, and alternatives regarding  endoscopy and colonoscopy have been reviewed with the patient.  Questions have been answered.  All parties agreeable.   Ollen Bowl, MD  12/11/2015, 10:33 AM

## 2015-12-11 NOTE — Op Note (Signed)
[facility] Gastroenterology Patient Name: Lydia Elliott Procedure Date: 12/11/2015 11:23 AM MRN: [id number] Account #: [id number] Date of Birth: 01-10-1951 Admit Type: Outpatient Age: 65 Room: [facility] OR ROOM 01 Gender: Female Note Status: Finalized Procedure:            Upper GI endoscopy Indications:          Epigastric abdominal pain Providers:            Lucilla Lame, MD Referring MD:         Arnetha Courser (Referring MD) Medicines:            Propofol per Anesthesia Complications:        No immediate complications. Procedure:            Pre-Anesthesia Assessment:                       - Prior to the procedure, a History and Physical was                        performed, and patient medications and allergies were                        reviewed. The patient's tolerance of previous                        anesthesia was also reviewed. The risks and benefits of                        the procedure and the sedation options and risks were                        discussed with the patient. All questions were                        answered, and informed consent was obtained. Prior                        Anticoagulants: The patient has taken no previous                        anticoagulant or antiplatelet agents. ASA Grade                        Assessment: II - A patient with mild systemic disease.                        After reviewing the risks and benefits, the patient was                        deemed in satisfactory condition to undergo the                        procedure.                       After obtaining informed consent, the endoscope was                        passed under direct vision. Throughout the procedure,  the patient's blood pressure, pulse, and oxygen                        saturations were monitored continuously. The was                        introduced through the mouth, and advanced to the   second part of duodenum. The upper GI endoscopy was                        accomplished without difficulty. The patient tolerated                        the procedure well. Findings:      A small hiatal hernia was present.      The stomach was normal.      The examined duodenum was normal. Impression:           - Small hiatal hernia.                       - Normal stomach.                       - Normal examined duodenum.                       - No specimens collected. Recommendation:       - Continue present medications. Procedure Code(s):    --- Professional ---                       [phone], Esophagogastroduodenoscopy, flexible, transoral;                        diagnostic, including collection of specimen(s) by                        brushing or washing, when performed (separate procedure) Diagnosis Code(s):    --- Professional ---                       R10.13, Epigastric pain CPT copyright 2016 American Medical Association. All rights reserved. The codes documented in this report are preliminary and upon coder review may  be revised to meet current compliance requirements. Lucilla Lame, MD 12/11/2015 11:40:01 AM This report has been signed electronically. Number of Addenda: 0 Note Initiated On: 12/11/2015 11:23 AM      [facility]

## 2015-12-11 NOTE — Transfer of Care (Signed)
Immediate Anesthesia Transfer of Care Note  Patient: Lydia Elliott  Procedure(s) Performed: Procedure(s) with comments: COLONOSCOPY WITH PROPOFOL (N/A) - DESCENDING COLON POLYP  ESOPHAGOGASTRODUODENOSCOPY (EGD) WITH PROPOFOL (N/A) POLYPECTOMY (N/A)  Patient Location: PACU  Anesthesia Type: MAC  Level of Consciousness: awake, alert  and patient cooperative  Airway and Oxygen Therapy: Patient Spontanous Breathing and Patient connected to supplemental oxygen  Post-op Assessment: Post-op Vital signs reviewed, Patient's Cardiovascular Status Stable, Respiratory Function Stable, Patent Airway and No signs of Nausea or vomiting  Post-op Vital Signs: Reviewed and stable  Complications: No apparent anesthesia complications

## 2015-12-11 NOTE — Anesthesia Postprocedure Evaluation (Signed)
Anesthesia Post Note  Patient: Lydia Elliott  Procedure(s) Performed: Procedure(s) (LRB): COLONOSCOPY WITH PROPOFOL (N/A) ESOPHAGOGASTRODUODENOSCOPY (EGD) WITH PROPOFOL (N/A) POLYPECTOMY (N/A)  Patient location during evaluation: PACU Anesthesia Type: MAC Level of consciousness: awake and alert and oriented Pain management: satisfactory to patient Vital Signs Assessment: post-procedure vital signs reviewed and stable Respiratory status: spontaneous breathing, nonlabored ventilation and respiratory function stable Cardiovascular status: blood pressure returned to baseline and stable Postop Assessment: Adequate PO intake and No signs of nausea or vomiting Anesthetic complications: no    Raliegh Ip

## 2015-12-11 NOTE — Op Note (Signed)
[facility] Gastroenterology Patient Name: Lydia Elliott Procedure Date: 12/11/2015 11:41 AM MRN: [id number] Account #: [id number] Date of Birth: November 24, 1950 Admit Type: Outpatient Age: 65 Room: [facility] OR ROOM 01 Gender: Female Note Status: Finalized Procedure:            Colonoscopy Indications:          Hematochezia Providers:            Lucilla Lame, MD Referring MD:         Halina Maidens, MD (Referring MD) Medicines:            Propofol per Anesthesia Complications:        No immediate complications. Procedure:            Pre-Anesthesia Assessment:                       - Prior to the procedure, a History and Physical was                        performed, and patient medications and allergies were                        reviewed. The patient's tolerance of previous                        anesthesia was also reviewed. The risks and benefits of                        the procedure and the sedation options and risks were                        discussed with the patient. All questions were                        answered, and informed consent was obtained. Prior                        Anticoagulants: The patient has taken no previous                        anticoagulant or antiplatelet agents. ASA Grade                        Assessment: II - A patient with mild systemic disease.                        After reviewing the risks and benefits, the patient was                        deemed in satisfactory condition to undergo the                        procedure.                       After obtaining informed consent, the colonoscope was                        passed under direct vision. Throughout the procedure,  the patient's blood pressure, pulse, and oxygen                        saturations were monitored continuously. The Olympus                        CF-HQ190L Colonoscope (S#. [id number]) was introduced                        through the  anus and advanced to the the cecum,                        identified by appendiceal orifice and ileocecal valve.                        The colonoscopy was performed without difficulty. The                        patient tolerated the procedure well. The quality of                        the bowel preparation was excellent. Findings:      The perianal and digital rectal examinations were normal.      A 3 mm polyp was found in the descending colon. The polyp was sessile.       The polyp was removed with a cold biopsy forceps. Resection and       retrieval were complete.      Non-bleeding internal hemorrhoids were found during retroflexion. The       hemorrhoids were Grade II (internal hemorrhoids that prolapse but reduce       spontaneously). Impression:           - One 3 mm polyp in the descending colon, removed with                        a cold biopsy forceps. Resected and retrieved.                       - Non-bleeding internal hemorrhoids. Recommendation:       - Await pathology results. Procedure Code(s):    --- Professional ---                       [phone], Colonoscopy, flexible; with biopsy, single or                        multiple Diagnosis Code(s):    --- Professional ---                       K92.1, Melena (includes Hematochezia)                       D12.4, Benign neoplasm of descending colon CPT copyright 2016 American Medical Association. All rights reserved. The codes documented in this report are preliminary and upon coder review may  be revised to meet current compliance requirements. Lucilla Lame, MD 12/11/2015 11:56:24 AM This report has been signed electronically. Number of Addenda: 0 Note Initiated On: 12/11/2015 11:41 AM Scope Withdrawal Time: 0 hours 6 minutes 19 seconds  Total Procedure Duration: 0 hours 9 minutes 10 seconds  [facility]

## 2015-12-12 ENCOUNTER — Encounter: Payer: Self-pay | Admitting: Gastroenterology

## 2015-12-12 DIAGNOSIS — D124 Benign neoplasm of descending colon: Secondary | ICD-10-CM | POA: Diagnosis not present

## 2015-12-12 DIAGNOSIS — K625 Hemorrhage of anus and rectum: Secondary | ICD-10-CM | POA: Diagnosis not present

## 2015-12-12 DIAGNOSIS — K921 Melena: Secondary | ICD-10-CM | POA: Diagnosis not present

## 2015-12-14 ENCOUNTER — Encounter: Payer: Self-pay | Admitting: Gastroenterology

## 2015-12-17 ENCOUNTER — Encounter: Payer: Self-pay | Admitting: Gastroenterology

## 2015-12-23 ENCOUNTER — Encounter: Payer: Self-pay | Admitting: Family Medicine

## 2015-12-23 DIAGNOSIS — E663 Overweight: Secondary | ICD-10-CM | POA: Insufficient documentation

## 2015-12-23 DIAGNOSIS — F418 Other specified anxiety disorders: Secondary | ICD-10-CM | POA: Insufficient documentation

## 2015-12-23 HISTORY — DX: Overweight: E66.3

## 2015-12-23 NOTE — Assessment & Plan Note (Signed)
Chronic, noted

## 2015-12-23 NOTE — Assessment & Plan Note (Signed)
advised that since she is on plavix, she needs to stop her current PPI and only use pantoprazole

## 2015-12-23 NOTE — Assessment & Plan Note (Signed)
Patient has significant anxiety; will allow very limited amount of anxiolytic; do NOT take with six hours of zolpidem, and do not take with any alcohol

## 2015-12-23 NOTE — Assessment & Plan Note (Signed)
Patient to have colonoscopy soon

## 2015-12-23 NOTE — Assessment & Plan Note (Signed)
Controlled today 

## 2015-12-23 NOTE — Assessment & Plan Note (Signed)
Patient losing weight intentionally she reports; consider checking thyroid tests if ongoing weight loss and/or worsening anxiety

## 2016-01-23 ENCOUNTER — Telehealth: Payer: Self-pay | Admitting: Family Medicine

## 2016-01-23 ENCOUNTER — Ambulatory Visit: Payer: Medicare Other | Admitting: Family Medicine

## 2016-01-23 NOTE — Telephone Encounter (Signed)
Patient wanted to know why would you want her to call her gastroenterologist because you prescribed the medication? She will give them a call and she did cancel the appointment with Dr Ancil Boozer; said she would rather see you instead.

## 2016-01-23 NOTE — Telephone Encounter (Signed)
Patient was on Pantoprazole for about 4 weeks and she stopped taking the meds. She now feels she is having reaction to it. She has developed vomitting and diarrhea and wanted to know if this is a side effect from stopping the medication. She is feeling a little better today and says that she has not did anything since 4 but still would like to get checked out. She is asking that you please give her a call today to discuss this. I scheduled her an appointment with Dr Ancil Boozer for Thursday because I thought is was an acute visit until she explained what was going on. She will cancel the appointment for Thursday if you suggest otherwise.

## 2016-01-23 NOTE — Telephone Encounter (Signed)
I called and reached voicemail; left detailed message I'm out of the office; I was hoping the specialist she sees for GERD could have helped with this so that's why I asked her to call him instead today If serious side effects, go to an urgent care If she suspect side effects because of stopping the medicine, consider restarting the medicine until she can be seen

## 2016-01-23 NOTE — Telephone Encounter (Signed)
I will encourage her to call her gastroenterologist about this right away

## 2016-01-25 ENCOUNTER — Ambulatory Visit: Payer: Medicare Other | Admitting: Family Medicine

## 2016-02-02 DIAGNOSIS — M170 Bilateral primary osteoarthritis of knee: Secondary | ICD-10-CM | POA: Diagnosis not present

## 2016-02-07 DIAGNOSIS — F5104 Psychophysiologic insomnia: Secondary | ICD-10-CM | POA: Diagnosis not present

## 2016-02-07 DIAGNOSIS — I10 Essential (primary) hypertension: Secondary | ICD-10-CM | POA: Diagnosis not present

## 2016-02-07 DIAGNOSIS — E782 Mixed hyperlipidemia: Secondary | ICD-10-CM | POA: Diagnosis not present

## 2016-02-07 DIAGNOSIS — I2510 Atherosclerotic heart disease of native coronary artery without angina pectoris: Secondary | ICD-10-CM | POA: Diagnosis not present

## 2016-02-07 DIAGNOSIS — R42 Dizziness and giddiness: Secondary | ICD-10-CM | POA: Diagnosis not present

## 2016-02-07 DIAGNOSIS — F411 Generalized anxiety disorder: Secondary | ICD-10-CM | POA: Diagnosis not present

## 2016-02-07 DIAGNOSIS — I5181 Takotsubo syndrome: Secondary | ICD-10-CM | POA: Diagnosis not present

## 2016-02-27 DIAGNOSIS — I5181 Takotsubo syndrome: Secondary | ICD-10-CM | POA: Diagnosis not present

## 2016-02-27 DIAGNOSIS — I2510 Atherosclerotic heart disease of native coronary artery without angina pectoris: Secondary | ICD-10-CM | POA: Diagnosis not present

## 2016-03-04 DIAGNOSIS — I2510 Atherosclerotic heart disease of native coronary artery without angina pectoris: Secondary | ICD-10-CM | POA: Diagnosis not present

## 2016-03-04 DIAGNOSIS — I5181 Takotsubo syndrome: Secondary | ICD-10-CM | POA: Diagnosis not present

## 2016-03-04 DIAGNOSIS — E782 Mixed hyperlipidemia: Secondary | ICD-10-CM | POA: Diagnosis not present

## 2016-03-04 DIAGNOSIS — I10 Essential (primary) hypertension: Secondary | ICD-10-CM | POA: Diagnosis not present

## 2016-04-22 ENCOUNTER — Encounter: Payer: Self-pay | Admitting: Family Medicine

## 2016-04-22 ENCOUNTER — Ambulatory Visit (INDEPENDENT_AMBULATORY_CARE_PROVIDER_SITE_OTHER): Payer: Medicare Other | Admitting: Family Medicine

## 2016-04-22 VITALS — BP 118/74 | HR 76 | Temp 98.5°F | Wt 144.0 lb

## 2016-04-22 DIAGNOSIS — F418 Other specified anxiety disorders: Secondary | ICD-10-CM | POA: Diagnosis not present

## 2016-04-22 DIAGNOSIS — I2510 Atherosclerotic heart disease of native coronary artery without angina pectoris: Secondary | ICD-10-CM

## 2016-04-22 DIAGNOSIS — R7301 Impaired fasting glucose: Secondary | ICD-10-CM

## 2016-04-22 DIAGNOSIS — R4589 Other symptoms and signs involving emotional state: Secondary | ICD-10-CM

## 2016-04-22 DIAGNOSIS — R5383 Other fatigue: Secondary | ICD-10-CM | POA: Diagnosis not present

## 2016-04-22 DIAGNOSIS — E785 Hyperlipidemia, unspecified: Secondary | ICD-10-CM | POA: Diagnosis not present

## 2016-04-22 DIAGNOSIS — Z23 Encounter for immunization: Secondary | ICD-10-CM | POA: Diagnosis not present

## 2016-04-22 DIAGNOSIS — R195 Other fecal abnormalities: Secondary | ICD-10-CM | POA: Diagnosis not present

## 2016-04-22 DIAGNOSIS — D569 Thalassemia, unspecified: Secondary | ICD-10-CM

## 2016-04-22 DIAGNOSIS — Z5181 Encounter for therapeutic drug level monitoring: Secondary | ICD-10-CM | POA: Diagnosis not present

## 2016-04-22 DIAGNOSIS — I10 Essential (primary) hypertension: Secondary | ICD-10-CM

## 2016-04-22 DIAGNOSIS — K219 Gastro-esophageal reflux disease without esophagitis: Secondary | ICD-10-CM | POA: Diagnosis not present

## 2016-04-22 MED ORDER — RANITIDINE HCL 300 MG PO TABS: 300.0000 mg | ORAL_TABLET | Freq: Every day | ORAL | 5 refills | Status: DC

## 2016-04-22 NOTE — Assessment & Plan Note (Addendum)
Monitored by hematologist; stable

## 2016-04-22 NOTE — Assessment & Plan Note (Signed)
Check A1c. 

## 2016-04-22 NOTE — Assessment & Plan Note (Signed)
Monitored by Dr. Ubaldo Glassing

## 2016-04-22 NOTE — Assessment & Plan Note (Addendum)
Check lipids fasting on another day; limit saturated fats

## 2016-04-22 NOTE — Assessment & Plan Note (Addendum)
Check TSH; weight loss, "revved up" feeling may reflect thyroid disease

## 2016-04-22 NOTE — Assessment & Plan Note (Signed)
Check labs, sgpt on statin, Cr and K+ on the ACE-I

## 2016-04-22 NOTE — Assessment & Plan Note (Signed)
Controlled; managed by Dr. Ubaldo Glassing; try DASH guidelines

## 2016-04-22 NOTE — Patient Instructions (Addendum)
Caution: prolonged use of proton pump inhibitors like omeprazole (Prilosec), pantoprazole (Protonix), esomeprazole (Nexium), and others like Dexilant and Aciphex may increase your risk of pneumonia, Clostridium difficile colitis, osteoporosis, anemia and other health complications Try to limit or avoid triggers like coffee, caffeinated beverages, onions, chocolate, spicy foods, peppermint, acid foods like pizza, spaghetti sauce, and orange juice Lose weight if you are overweight or obese Try elevating the head of your bed by placing a small wedge between your mattress and box springs to keep acid in the stomach at night instead of coming up into your esophagus Start to wean off of the omeprazole, skip one night the first week, skip two nights the second week Start the ranitidine to help with acid reflux I suggest counseling  12 Ways to Curb Anxiety  ?Anxiety is normal human sensation. It is what helped our ancestors survive the pitfalls of the wilderness. Anxiety is defined as experiencing worry or nervousness about an imminent event or something with an uncertain outcome. It is a feeling experienced by most people at some point in their lives. Anxiety can be triggered by a very personal issue, such as the illness of a loved one, or an event of global proportions, such as a refugee crisis. Some of the symptoms of anxiety are:  Feeling restless.  Having a feeling of impending danger.  Increased heart rate.  Rapid breathing. Sweating.  Shaking.  Weakness or feeling tired.  Difficulty concentrating on anything except the current worry.  Insomnia.  Stomach or bowel problems. What can we do about anxiety we may be feeling? There are many techniques to help manage stress and relax. Here are 12 ways you can reduce your anxiety almost immediately: 1. Turn off the constant feed of information. Take a social media sabbatical. Studies have shown that social media directly contributes to social anxiety.   2. Monitor your television viewing habits. Are you watching shows that are also contributing to your anxiety, such as 24-hour news stations? Try watching something else, or better yet, nothing at all. Instead, listen to music, read an inspirational book or practice a hobby. 3. Eat nutritious meals. Also, don't skip meals and keep healthful snacks on hand. Hunger and poor diet contributes to feeling anxious. 4. Sleep. Sleeping on a regular schedule for at least seven to eight hours a night will do wonders for your outlook when you are awake. 5. Exercise. Regular exercise will help rid your body of that anxious energy and help you get more restful sleep. 6. Try deep (diaphragmatic) breathing. Inhale slowly through your nose for five seconds and exhale through your mouth. 7. Practice acceptance and gratitude. When anxiety hits, accept that there are things out of your control that shouldn't be of immediate concern.  8. Seek out humor. When anxiety strikes, watch a funny video, read jokes or call a friend who makes you laugh. Laughter is healing for our bodies and releases endorphins that are calming. 9. Stay positive. Take the effort to replace negative thoughts with positive ones. Try to see a stressful situation in a positive light. Try to come up with solutions rather than dwelling on the problem. 10. Figure out what triggers your anxiety. Keep a journal and make note of anxious moments and the events surrounding them. This will help you identify triggers you can avoid or even eliminate. 11. Talk to someone. Let a trusted friend, family member or even trained professional know that you are feeling overwhelmed and anxious. Verbalize what you are  feeling and why.  12. Volunteer. If your anxiety is triggered by a crisis on a large scale, become an advocate and work to resolve the problem that is causing you unease. Anxiety is often unwelcome and can become overwhelming. If not kept in check, it can become  a disorder that could require medical treatment. However, if you take the time to care for yourself and avoid the triggers that make you anxious, you will be able to find moments of relaxation and clarity that make your life much more enjoyable.

## 2016-04-22 NOTE — Progress Notes (Signed)
BP 118/74   Pulse 76   Temp 98.5 F (36.9 C)   Wt 144 lb (65.3 kg)   SpO2 94%   BMI 26.34 kg/m    Subjective:    Patient ID: Lydia Elliott, female    DOB: 05/12/1951, 65 y.o.   MRN: [id number]  HPI: Lydia Elliott is a 65 y.o. female  Chief Complaint  Patient presents with  . Anxiety    cardo doc change meds and added meds   Patient here for f/u She went to her cardiologist, Dr. Ubaldo Glassing, and he changed her medicine; she was getting low pressures, he has decreased that and she is feeling better now Since last visit, she says her husband had knee surgery and she was waiting on him, had more stress, but that's getting better now Her stomach was going along with her nerves; alternates constipation and loose stools; had colonoscopy and EGD earlier this year Started having panic attacks; on lexapro Was weepy and just felt awful She is losing weight; feels a little revved up; went on a diet in April, Weight Watches, wanted to lose weight for her birthday, and then kept losing weight; lot going on with his surgery She is feeling better now with stress She had an accident not long ago; she cracked her head and couldn't stop the bleeding; paramedics got it to stop bleeding, that was 6-7 weeks ago They stopped the plavix She is on omeprazole; she does not have Barrett's esophagus She has thalassemia and has seen hematologist; they told her no worries  Depression screen [facility] 2/9 04/22/2016 11/27/2015  Decreased Interest 1 0  Down, Depressed, Hopeless 1 0  PHQ - 2 Score 2 0  Altered sleeping 0 -  Tired, decreased energy 0 -  Change in appetite 0 -  Feeling bad or failure about yourself  3 -  Trouble concentrating 0 -  Moving slowly or fidgety/restless 0 -  Suicidal thoughts 0 -  PHQ-9 Score 5 -  Difficult doing work/chores Not difficult at all -   Relevant past medical, surgical, family and social history reviewed Past Medical History:  Diagnosis Date  . Arthritis    hands, knees,  toes  . CAD (coronary artery disease)   . Chronic anemia   . GERD (gastroesophageal reflux disease)   . Heart disease    has a stent  . Hyperlipidemia   . Hypertension   . Insomnia   . Microcytosis    Hgb 10.9, MCV 66 April 2015  . Overweight (BMI 25.0-29.9) 12/23/2015  . Polyarthralgia   . Thalassemia   . Vertigo    none - 10 yrs  . Wears dentures    partial lower   Past Surgical History:  Procedure Laterality Date  . APPENDECTOMY  1967  . CARPAL TUNNEL RELEASE  2007  . CATARACT EXTRACTION  2013  . CHOLECYSTECTOMY  2000  . COLONOSCOPY  7/31//15   Dr.Wohl  . COLONOSCOPY WITH PROPOFOL N/A 12/11/2015   Procedure: COLONOSCOPY WITH PROPOFOL;  Surgeon: Lucilla Lame, MD;  Location: [place];  Service: Endoscopy;  Laterality: N/A;  DESCENDING COLON POLYP   . CORONARY ANGIOPLASTY WITH STENT PLACEMENT  2010  . ESOPHAGOGASTRODUODENOSCOPY  02/21/14   Dr.Wohl  . ESOPHAGOGASTRODUODENOSCOPY (EGD) WITH PROPOFOL N/A 12/11/2015   Procedure: ESOPHAGOGASTRODUODENOSCOPY (EGD) WITH PROPOFOL;  Surgeon: Lucilla Lame, MD;  Location: [place];  Service: Endoscopy;  Laterality: N/A;  . POLYPECTOMY N/A 12/11/2015   Procedure: POLYPECTOMY;  Surgeon: Lucilla Lame, MD;  Location:  [place];  Service: Endoscopy;  Laterality: N/A;  . TONSILLECTOMY AND ADENOIDECTOMY  1969   Family History  Problem Relation Age of Onset  . Cirrhosis Mother   . Hepatitis Mother   . Thalassemia Mother   . Diabetes Mother   . Heart disease Father   . Diabetes Father   . Hyperlipidemia Father   . Hypertension Father   . Stroke Father   . Cancer Sister     lung  . Diabetes Sister   . Stroke Sister     during a surgery  . Diabetes Brother   . Heart disease Brother   . Hypertension Brother   . Stroke Brother   . Thalassemia Maternal Grandmother   . COPD Neg Hx    Social History  Substance Use Topics  . Smoking status: Never Smoker  . Smokeless tobacco: Never Used  . Alcohol use 0.6  oz/week    1 Shots of liquor per week     Comment: socially   Interim medical history since last visit reviewed. Allergies and medications reviewed  Review of Systems Per HPI unless specifically indicated above     Objective:    BP 118/74   Pulse 76   Temp 98.5 F (36.9 C)   Wt 144 lb (65.3 kg)   SpO2 94%   BMI 26.34 kg/m   Wt Readings from Last 3 Encounters:  04/22/16 144 lb (65.3 kg)  12/11/15 146 lb (66.2 kg)  11/27/15 149 lb (67.6 kg)    Physical Exam  Constitutional: She appears well-developed and well-nourished. No distress.  Weight loss 5 pounds over 5 months  HENT:  Head: Normocephalic and atraumatic.  Eyes: EOM are normal. No scleral icterus.  Neck: No thyromegaly present.  Cardiovascular: Normal rate, regular rhythm and normal heart sounds.   Pulmonary/Chest: Effort normal and breath sounds normal. She has no wheezes.  Abdominal: Soft. Bowel sounds are normal. She exhibits no distension. There is no tenderness. There is no guarding.  Musculoskeletal: Normal range of motion. She exhibits no edema.  Neurological: She is alert. She exhibits normal muscle tone.  Skin: Skin is warm and dry. She is not diaphoretic. No pallor.  Psychiatric: She has a normal mood and affect. Her behavior is normal. Judgment and thought content normal.   Results for orders placed or performed in visit on 08/11/15  CBC with Differential/Platelet  Result Value Ref Range   WBC 6.8 3.6 - 11.0 K/uL   RBC 5.51 (H) 3.80 - 5.20 MIL/uL   Hemoglobin 11.1 (L) 12.0 - 16.0 g/dL   HCT 35.3 35.0 - 47.0 %   MCV 64.2 (L) 80.0 - 100.0 fL   MCH 20.1 (L) 26.0 - 34.0 pg   MCHC 31.4 (L) 32.0 - 36.0 g/dL   RDW 15.1 (H) 11.5 - 14.5 %   Platelets 251 150 - 440 K/uL   Neutrophils Relative % 67 %   Neutro Abs 4.6 1.4 - 6.5 K/uL   Lymphocytes Relative 23 %   Lymphs Abs 1.5 1.0 - 3.6 K/uL   Monocytes Relative 7 %   Monocytes Absolute 0.5 0.2 - 0.9 K/uL   Eosinophils Relative 2 %   Eosinophils Absolute  0.1 0 - 0.7 K/uL   Basophils Relative 1 %   Basophils Absolute 0.1 0 - 0.1 K/uL  Ferritin  Result Value Ref Range   Ferritin 303 11 - 307 ng/mL  Hemoglobinopathy evaluation  Result Value Ref Range   Hgb A2 Quant 5.0 (H) 0.7 -  3.1 %   Hgb F Quant 0.0 0.0 - 2.0 %   Hgb S Quant 0.0 0.0 %   Hgb C 0.0 0.0 %   Hgb A 95.0 94.0 - 98.0 %   Please Note: Comment       Assessment & Plan:   Problem List Items Addressed This Visit      Cardiovascular and Mediastinum   Hypertension - Primary    Controlled; managed by Dr. Ubaldo Glassing; try DASH guidelines      Relevant Medications   carvedilol (COREG) 3.125 MG tablet   CAD (coronary artery disease)    Monitored by Dr. Ubaldo Glassing      Relevant Medications   carvedilol (COREG) 3.125 MG tablet     Digestive   GERD (gastroesophageal reflux disease)    Explained concerns about long-term use of PPIs; will add H2 blocker; avoid triggers; elevate HOB, etc      Relevant Medications   omeprazole (PRILOSEC) 20 MG capsule   ranitidine (ZANTAC) 300 MG tablet     Endocrine   Impaired fasting glucose    Check A1c        Other   Thalassemia    Monitored by hematologist; stable      Medication monitoring encounter    Check labs, sgpt on statin, Cr and K+ on the ACE-I      Relevant Orders   COMPLETE METABOLIC PANEL WITH GFR   Hyperlipidemia    Check lipids fasting on another day; limit saturated fats      Relevant Medications   carvedilol (COREG) 3.125 MG tablet   Other Relevant Orders   Lipid panel   Guaiac positive stools    Recently had EGD and colonoscopy (May 2017)      Fatigue    Check TSH; weight loss, "revved up" feeling may reflect thyroid disease      Relevant Orders   TSH   Anxiety about health    Glad she is doing better; see after visit summary for recommendations given to help with anxiety       Other Visit Diagnoses    Needs flu shot       Relevant Orders   Flu vaccine HIGH DOSE PF (Fluzone High dose) (Completed)        Follow up plan: Return in about 3 months (around 07/22/2016).  An after-visit summary was printed and given to the patient at [facility].  Please see the patient instructions which may contain other information and recommendations beyond what is mentioned above in the assessment and plan.  Meds ordered this encounter  Medications  . carvedilol (COREG) 3.125 MG tablet    Sig: Take by mouth.  Marland Kitchen omeprazole (PRILOSEC) 20 MG capsule    Sig: Take 20 mg by mouth daily.  . ranitidine (ZANTAC) 300 MG tablet    Sig: Take 1 tablet (300 mg total) by mouth at bedtime. For acid reflux    Dispense:  30 tablet    Refill:  5    Orders Placed This Encounter  Procedures  . Flu vaccine HIGH DOSE PF (Fluzone High dose)  . TSH  . COMPLETE METABOLIC PANEL WITH GFR  . Lipid panel

## 2016-04-24 DIAGNOSIS — K219 Gastro-esophageal reflux disease without esophagitis: Secondary | ICD-10-CM | POA: Insufficient documentation

## 2016-04-24 NOTE — Assessment & Plan Note (Signed)
Recently had EGD and colonoscopy (May 2017)

## 2016-04-24 NOTE — Assessment & Plan Note (Signed)
Explained concerns about long-term use of PPIs; will add H2 blocker; avoid triggers; elevate HOB, etc

## 2016-04-24 NOTE — Assessment & Plan Note (Addendum)
Glad she is doing better; see after visit summary for recommendations given to help with anxiety

## 2016-05-10 ENCOUNTER — Other Ambulatory Visit: Payer: Self-pay | Admitting: Family Medicine

## 2016-05-10 DIAGNOSIS — E785 Hyperlipidemia, unspecified: Secondary | ICD-10-CM | POA: Diagnosis not present

## 2016-05-10 DIAGNOSIS — R5383 Other fatigue: Secondary | ICD-10-CM | POA: Diagnosis not present

## 2016-05-10 DIAGNOSIS — Z5181 Encounter for therapeutic drug level monitoring: Secondary | ICD-10-CM | POA: Diagnosis not present

## 2016-05-10 LAB — COMPLETE METABOLIC PANEL WITH GFR
ALBUMIN: 4.4 g/dL (ref 3.6–5.1)
ALT: 10 U/L (ref 6–29)
AST: 13 U/L (ref 10–35)
Alkaline Phosphatase: 66 U/L (ref 33–130)
BUN: 18 mg/dL (ref 7–25)
CO2: 26 mmol/L (ref 20–31)
Calcium: 9 mg/dL (ref 8.6–10.4)
Chloride: 103 mmol/L (ref 98–110)
Creat: 0.76 mg/dL (ref 0.50–0.99)
GFR, EST NON AFRICAN AMERICAN: 82 mL/min (ref >=60)
GFR, Est African American: >89 mL/min (ref >=60)
GLUCOSE: 125 mg/dL — AB (ref 65–99)
POTASSIUM: 4.8 mmol/L (ref 3.5–5.3)
SODIUM: 138 mmol/L (ref 135–146)
Total Bilirubin: 1.2 mg/dL (ref 0.2–1.2)
Total Protein: 7.1 g/dL (ref 6.1–8.1)

## 2016-05-10 LAB — LIPID PANEL
CHOL/HDL RATIO: 2.8 ratio (ref <=5.0)
Cholesterol: 154 mg/dL (ref 125–200)
HDL: 56 mg/dL (ref >=46)
LDL CALC: 82 mg/dL (ref <130)
Triglycerides: 79 mg/dL (ref <150)
VLDL: 16 mg/dL (ref <30)

## 2016-05-10 LAB — TSH: TSH: 2.06 m[IU]/L

## 2016-07-22 ENCOUNTER — Ambulatory Visit: Payer: Medicare Other | Admitting: Family Medicine

## 2016-09-04 DIAGNOSIS — I2510 Atherosclerotic heart disease of native coronary artery without angina pectoris: Secondary | ICD-10-CM | POA: Diagnosis not present

## 2016-09-04 DIAGNOSIS — E782 Mixed hyperlipidemia: Secondary | ICD-10-CM | POA: Diagnosis not present

## 2016-09-04 DIAGNOSIS — F5104 Psychophysiologic insomnia: Secondary | ICD-10-CM | POA: Diagnosis not present

## 2016-09-04 DIAGNOSIS — F411 Generalized anxiety disorder: Secondary | ICD-10-CM | POA: Diagnosis not present

## 2016-09-04 DIAGNOSIS — I5181 Takotsubo syndrome: Secondary | ICD-10-CM | POA: Diagnosis not present

## 2016-09-04 DIAGNOSIS — I10 Essential (primary) hypertension: Secondary | ICD-10-CM | POA: Diagnosis not present

## 2016-09-18 DIAGNOSIS — Z23 Encounter for immunization: Secondary | ICD-10-CM | POA: Diagnosis not present

## 2016-10-01 DIAGNOSIS — M170 Bilateral primary osteoarthritis of knee: Secondary | ICD-10-CM | POA: Diagnosis not present

## 2016-10-01 DIAGNOSIS — M25569 Pain in unspecified knee: Secondary | ICD-10-CM | POA: Insufficient documentation

## 2016-10-15 ENCOUNTER — Emergency Department: Payer: Medicare Other

## 2016-10-15 ENCOUNTER — Emergency Department
Admission: EM | Admit: 2016-10-15 | Discharge: 2016-10-15 | Disposition: A | Discharge status: Home / Self Care | Payer: Medicare Other | Attending: Emergency Medicine | Admitting: Emergency Medicine

## 2016-10-15 DIAGNOSIS — I10 Essential (primary) hypertension: Secondary | ICD-10-CM | POA: Diagnosis not present

## 2016-10-15 DIAGNOSIS — R1013 Epigastric pain: Secondary | ICD-10-CM | POA: Diagnosis not present

## 2016-10-15 DIAGNOSIS — I700 Atherosclerosis of aorta: Secondary | ICD-10-CM | POA: Diagnosis not present

## 2016-10-15 DIAGNOSIS — Z79899 Other long term (current) drug therapy: Secondary | ICD-10-CM | POA: Insufficient documentation

## 2016-10-15 DIAGNOSIS — I2510 Atherosclerotic heart disease of native coronary artery without angina pectoris: Secondary | ICD-10-CM | POA: Insufficient documentation

## 2016-10-15 DIAGNOSIS — Z7982 Long term (current) use of aspirin: Secondary | ICD-10-CM | POA: Insufficient documentation

## 2016-10-15 DIAGNOSIS — R079 Chest pain, unspecified: Secondary | ICD-10-CM | POA: Insufficient documentation

## 2016-10-15 DIAGNOSIS — M898X9 Other specified disorders of bone, unspecified site: Secondary | ICD-10-CM

## 2016-10-15 HISTORY — DX: Other specified disorders of bone, unspecified site: M89.8X9

## 2016-10-15 LAB — TROPONIN I: Troponin I: <0.03 ng/mL (ref <0.03)

## 2016-10-15 LAB — LIPASE, BLOOD: LIPASE: 36 U/L (ref 11–51)

## 2016-10-15 LAB — HEPATIC FUNCTION PANEL
ALBUMIN: 4.2 g/dL (ref 3.5–5.0)
ALT: 16 U/L (ref 14–54)
AST: 18 U/L (ref 15–41)
Alkaline Phosphatase: 60 U/L (ref 38–126)
Bilirubin, Direct: 0.1 mg/dL (ref 0.1–0.5)
Indirect Bilirubin: 0.9 mg/dL (ref 0.3–0.9)
TOTAL PROTEIN: 7.2 g/dL (ref 6.5–8.1)
Total Bilirubin: 1 mg/dL (ref 0.3–1.2)

## 2016-10-15 LAB — BASIC METABOLIC PANEL
Anion gap: 9 (ref 5–15)
BUN: 23 mg/dL — AB (ref 6–20)
CHLORIDE: 101 mmol/L (ref 101–111)
CO2: 25 mmol/L (ref 22–32)
CREATININE: 0.59 mg/dL (ref 0.44–1.00)
Calcium: 9.4 mg/dL (ref 8.9–10.3)
Glucose, Bld: 107 mg/dL — ABNORMAL HIGH (ref 65–99)
POTASSIUM: 4 mmol/L (ref 3.5–5.1)
SODIUM: 135 mmol/L (ref 135–145)

## 2016-10-15 LAB — CBC
HEMATOCRIT: 32.7 % — AB (ref 35.0–47.0)
Hemoglobin: 10.6 g/dL — ABNORMAL LOW (ref 12.0–16.0)
MCH: 21.2 pg — ABNORMAL LOW (ref 26.0–34.0)
MCHC: 32.3 g/dL (ref 32.0–36.0)
MCV: 65.6 fL — ABNORMAL LOW (ref 80.0–100.0)
PLATELETS: 292 10*3/uL (ref 150–440)
RBC: 4.99 MIL/uL (ref 3.80–5.20)
RDW: 14.9 % — AB (ref 11.5–14.5)
WBC: 10 10*3/uL (ref 3.6–11.0)

## 2016-10-15 MED ORDER — GI COCKTAIL ~~LOC~~
30.0000 mL | ORAL | Status: AC
  Administered 2016-10-15: 30 mL via ORAL
  Filled 2016-10-15: qty 30

## 2016-10-15 MED ORDER — IOPAMIDOL (ISOVUE-370) INJECTION 76%
100.0000 mL | Freq: Once | INTRAVENOUS | Status: AC | PRN
  Administered 2016-10-15: 100 mL via INTRAVENOUS

## 2016-10-15 MED ORDER — FAMOTIDINE 20 MG PO TABS
40.0000 mg | ORAL_TABLET | Freq: Once | ORAL | Status: AC
  Administered 2016-10-15: 40 mg via ORAL
  Filled 2016-10-15: qty 2

## 2016-10-15 MED ORDER — LORAZEPAM 2 MG/ML IJ SOLN
1.0000 mg | Freq: Once | INTRAMUSCULAR | Status: AC
  Administered 2016-10-15: 1 mg via INTRAVENOUS
  Filled 2016-10-15: qty 1

## 2016-10-15 NOTE — ED Notes (Signed)
ED Provider at bedside. 

## 2016-10-15 NOTE — ED Notes (Signed)
Pt states my mouth is numb after drinking gi cocktail.  Pt anxious.  States im feeling better   No c hest pain.  nsr on monitor.  Family with pt.  Iv in place

## 2016-10-15 NOTE — ED Triage Notes (Signed)
Pt comes into the ED via EMS with c/o chest pain that radiates into her back with excursion 1hr PTA, EMS gave pt nitro spray x1 and pt took ASA 325mg  PTA, pt states pain was 8/10, now 3/10. Denies SOB, N/V

## 2016-10-15 NOTE — Discharge Instructions (Signed)
Your CT scan does not show any serious cause of your chest pain. Follow up with your doctor for further monitoring of your symptoms. As we discussed, you should have a repeat CT scan of your chest in 6-12 months to monitor the lung nodule that was seen.  Radiology report summary: CTA CHEST: No acute vascular process or acute cardiopulmonary disease. Mild atherosclerosis of the thoracic aorta.   **An incidental finding of potential clinical significance has been found. 11 mm solitary ground-glass nodule RIGHT lower lobe for which follow-up CT at 6-12 months is recommended to confirm persistence. This recommendation follows the consensus statement: Guidelines for Management of Small Pulmonary Nodules Detected on CT Images: From the Fleischner Society 2017; Radiology 2017; 284:228-243.**   CTA ABDOMEN AND PELVIS: No acute vascular process. Moderate atherosclerosis of the abdominal aorta.   Mildly dilated jejunum associated with "misty" mesentery suggesting mild ileus and reactive changes.   Moderate retained large bowel stool without bowel obstruction.

## 2016-10-15 NOTE — ED Provider Notes (Signed)
[facility] Emergency Department Provider Note  ____________________________________________  Time seen: Approximately 6:26 PM  I have reviewed the triage vital signs and the nursing notes.   HISTORY  Chief Complaint Chest Pain    HPI Lydia Elliott is a 66 y.o. female who complains of sudden onset of severe central chest pain radiating straight through to her back that is sharp that started around 2 PM today. Been constant since then. She feels very anxious. She thinks this is probably all due to her anxiety. She's never had pain like this before though. Not exertional, nonpleuritic. No paresthesias or motor weakness. No dizziness.  A [place] food for dinner last night   Past Medical History:  Diagnosis Date  . Arthritis    hands, knees, toes  . CAD (coronary artery disease)   . Chronic anemia   . GERD (gastroesophageal reflux disease)   . Heart disease    has a stent  . Hyperlipidemia   . Hypertension   . Insomnia   . Microcytosis    Hgb 10.9, MCV 66 April 2015  . Overweight (BMI 25.0-29.9) 12/23/2015  . Polyarthralgia   . Thalassemia   . Vertigo    none - 10 yrs  . Wears dentures    partial lower     Patient Active Problem List   Diagnosis Date Noted  . GERD (gastroesophageal reflux disease) 04/24/2016  . Anxiety about health 12/23/2015  . Overweight (BMI 25.0-29.9) 12/23/2015  . Blood in stool   . Benign neoplasm of descending colon   . Guaiac positive stools 07/11/2015  . Impaired fasting glucose 07/03/2015  . Anemia 07/03/2015  . Takotsubo cardiomyopathy 06/18/2015  . Medication monitoring encounter 06/18/2015  . Abdominal pain, epigastric 06/18/2015  . Fatigue 06/14/2015  . Hypertension   . Hyperlipidemia   . Insomnia   . Polyarthralgia   . CAD (coronary artery disease)   . Thalassemia   . Heart disease      Past Surgical History:  Procedure Laterality Date  . APPENDECTOMY  1967  . CARPAL TUNNEL RELEASE  2007  .  CATARACT EXTRACTION  2013  . CHOLECYSTECTOMY  2000  . COLONOSCOPY  7/31//15   Dr.Wohl  . COLONOSCOPY WITH PROPOFOL N/A 12/11/2015   Procedure: COLONOSCOPY WITH PROPOFOL;  Surgeon: Lucilla Lame, MD;  Location: [place];  Service: Endoscopy;  Laterality: N/A;  DESCENDING COLON POLYP   . CORONARY ANGIOPLASTY WITH STENT PLACEMENT  2010  . ESOPHAGOGASTRODUODENOSCOPY  02/21/14   Dr.Wohl  . ESOPHAGOGASTRODUODENOSCOPY (EGD) WITH PROPOFOL N/A 12/11/2015   Procedure: ESOPHAGOGASTRODUODENOSCOPY (EGD) WITH PROPOFOL;  Surgeon: Lucilla Lame, MD;  Location: [place];  Service: Endoscopy;  Laterality: N/A;  . POLYPECTOMY N/A 12/11/2015   Procedure: POLYPECTOMY;  Surgeon: Lucilla Lame, MD;  Location: [place];  Service: Endoscopy;  Laterality: N/A;  . [place]     Prior to Admission medications   Medication Sig Start Date End Date Taking? Authorizing Provider  aspirin EC 81 MG tablet Take 81 mg by mouth daily.   Yes Historical Provider, MD  carvedilol (COREG) 6.25 MG tablet Take 6.25 mg by mouth 2 (two) times daily with a meal.  03/15/16 03/15/17 Yes Historical Provider, MD  escitalopram (LEXAPRO) 10 MG tablet Take 10 mg by mouth daily. 05/16/15  Yes Historical Provider, MD  lisinopril (PRINIVIL,ZESTRIL) 2.5 MG tablet Take 2.5 mg by mouth daily.    Yes Historical Provider, MD  MAGNESIUM PO Take 1 tablet by mouth daily.  Yes Historical Provider, MD  Multiple Vitamin (MULTIVITAMIN) tablet Take 1 tablet by mouth daily.   Yes Historical Provider, MD  omeprazole (PRILOSEC) 20 MG capsule Take 20 mg by mouth daily.   Yes Historical Provider, MD  simvastatin (ZOCOR) 40 MG tablet Take 40 mg by mouth at bedtime.    Yes Historical Provider, MD  TURMERIC PO Take by mouth daily.   Yes Historical Provider, MD  vitamin B-12 (CYANOCOBALAMIN) 1000 MCG tablet Take 1,000 mcg by mouth daily.   Yes Historical Provider, MD  zolpidem (AMBIEN) 10 MG tablet Take 5 mg by mouth at  bedtime as needed.  05/19/15  Yes Historical Provider, MD  LORazepam (ATIVAN) 0.5 MG tablet Take 1 tablet (0.5 mg total) by mouth 2 (two) times daily as needed for anxiety. Do not take within 6 hours of the zolpidem; no alcohol Patient not taking: Reported on 10/15/2016 11/27/15   Arnetha Courser, MD  Probiotic Product (PRO-BIOTIC BLEND PO) Take by mouth.    Historical Provider, MD  ranitidine (ZANTAC) 300 MG tablet Take 1 tablet (300 mg total) by mouth at bedtime. For acid reflux Patient not taking: Reported on 10/15/2016 04/22/16   Arnetha Courser, MD     Allergies Patient has no known allergies.   Family History  Problem Relation Age of Onset  . Cirrhosis Mother   . Hepatitis Mother   . Thalassemia Mother   . Diabetes Mother   . Heart disease Father   . Diabetes Father   . Hyperlipidemia Father   . Hypertension Father   . Stroke Father   . Cancer Sister     lung  . Diabetes Sister   . Stroke Sister     during a surgery  . Diabetes Brother   . Heart disease Brother   . Hypertension Brother   . Stroke Brother   . Thalassemia Maternal Grandmother   . COPD Neg Hx     Social History Social History  Substance Use Topics  . Smoking status: Never Smoker  . Smokeless tobacco: Never Used  . Alcohol use 0.6 oz/week    1 Shots of liquor per week     Comment: socially    Review of Systems  Constitutional:   No fever or chills.  ENT:   No sore throat. No rhinorrhea. Cardiovascular:   Positive as above chest pain. Respiratory:   No dyspnea or cough. Gastrointestinal:   Negative for abdominal pain, vomiting and diarrhea.  Genitourinary:   Negative for dysuria or difficulty urinating. Musculoskeletal:   Negative for focal pain or swelling Neurological:   Negative for headaches 10-point ROS otherwise negative.  ____________________________________________   PHYSICAL EXAM:  VITAL SIGNS: ED Triage Vitals  Enc Vitals Group     BP 10/15/16 1452 (!) 181/71     Pulse Rate  10/15/16 1452 73     Resp 10/15/16 1452 18     Temp 10/15/16 1452 98.5 F (36.9 C)     Temp Source 10/15/16 1452 Oral     SpO2 10/15/16 1452 97 %     Weight 10/15/16 1453 145 lb (65.8 kg)     Height 10/15/16 1453 5\' 2"  (1.575 m)     Head Circumference --      Peak Flow --      Pain Score 10/15/16 1453 3     Pain Loc --      Pain Edu? --      Excl. in [place]? --     Vital  signs reviewed, nursing assessments reviewed.   Constitutional:   Alert and oriented. Well appearing and in no distress. Eyes:   No scleral icterus. No conjunctival pallor. PERRL. EOMI.  No nystagmus. ENT   Head:   Normocephalic and atraumatic.   Nose:   No congestion/rhinnorhea. No septal hematoma   Mouth/Throat:   MMM, no pharyngeal erythema. No peritonsillar mass.    Neck:   No stridor. No SubQ emphysema. No meningismus. Hematological/Lymphatic/Immunilogical:   No cervical lymphadenopathy. Cardiovascular:   RRR. Symmetric bilateral radial and DP pulses.  No murmurs.  Respiratory:   Normal respiratory effort without tachypnea nor retractions. Breath sounds are clear and equal bilaterally. No wheezes/rales/rhonchi. Chest pain not reproducible Gastrointestinal:   Soft and nontender. Non distended. There is no CVA tenderness.  No rebound, rigidity, or guarding.  Genitourinary:   deferred Musculoskeletal:   Normal range of motion in all extremities. No joint effusions.  No lower extremity tenderness.  No edema. Neurologic:   Normal speech and language.  CN 2-10 normal. Motor grossly intact. No gross focal neurologic deficits are appreciated.  Skin:    Skin is warm, dry and intact. No rash noted.  No petechiae, purpura, or bullae.  ____________________________________________    LABS (pertinent positives/negatives) (all labs ordered are listed, but only abnormal results are displayed) Labs Reviewed  BASIC METABOLIC PANEL - Abnormal; Notable for the following:       Result Value   Glucose, Bld 107  (*)    BUN 23 (*)    All other components within normal limits  CBC - Abnormal; Notable for the following:    Hemoglobin 10.6 (*)    HCT 32.7 (*)    MCV 65.6 (*)    MCH 21.2 (*)    RDW 14.9 (*)    All other components within normal limits  TROPONIN I  HEPATIC FUNCTION PANEL  LIPASE, BLOOD  TROPONIN I   ____________________________________________   EKG  Interpreted by me Normal sinus rhythm rate of 69, normal axis. Normal intervals. Poor R-wave progression in anterior precordial leads. Normal ST segments and T waves.  ____________________________________________    RADIOLOGY  Dg Chest 2 View  Result Date: 10/15/2016 CLINICAL DATA:  Chest pain. EXAM: CHEST  2 VIEW COMPARISON:  None. FINDINGS: The lungs are clear wiithout focal pneumonia, edema, pneumothorax or pleural effusion. 6 mm nodular density overlies the anterior right second rib. The cardiopericardial silhouette is within normal limits for size. Nodular density/densities projecting over the lungs are compatible with pads for telemetry leads. The visualized bony structures of the thorax are intact. IMPRESSION: 6 mm nodular density right upper lobe. CT chest without contrast recommended to further evaluate. Electronically Signed   By: Misty Stanley M.D.   On: 10/15/2016 15:33   Ct Angio Abdomen W And/or Wo Contrast  Result Date: 10/15/2016 CLINICAL DATA:  Sudden severe chest and epigastric pain radiating to the back. History of hypertension, hyperlipidemia. EXAM: CT ANGIOGRAPHY CHEST AND ABDOMEN TECHNIQUE: Multidetector CT imaging of the chest and abdomen was performed using the standard protocol during bolus administration of intravenous contrast. Multiplanar CT image reconstructions and MIPs were obtained to evaluate the vascular anatomy. CONTRAST:  100 cc Isovue 370 COMPARISON:  Chest radiograph October 15, 2016 at 1517 hours FINDINGS: CTA CHEST FINDINGS CARDIOVASCULAR: Thoracic aorta is normal course and caliber, mild calcific  atherosclerosis. No intrinsic density on noncontrast CT. Homogeneous contrast opacification of thoracic aorta without dissection, aneurysm, luminal irregularity, periaortic fluid collections, or contrast extravasation. Heart size is  normal. Mild coronary artery calcifications. No pericardial effusion. Though not tailored for evaluation, no central pulmonary embolus. MEDIASTINUM/NODES: No mediastinal mass or lymphadenopathy by CT size criteria. LUNGS/PLEURA: Tracheobronchial tree is patent, no pneumothorax. No pleural effusions, focal consolidations, or masses. 9 mm irregular ground-glass nodule RIGHT lower lobe, axial 27/57. Dependent atelectasis. MUSCULOSKELETAL:  Nonsuspicious.  Bone island RIGHT humeral head. Review of the MIP images confirms the above findings. CTA ABDOMEN AND PELVIS FINDINGS ARTERIES: Abdominal aorta is normal course and caliber, moderate calcific atherosclerosis. Homogeneous contrast opacification of aortoiliac vessels without dissection, aneurysm, luminal irregularity, periaortic fluid collections, or contrast extravasation. Celiac axis, superior and inferior mesenteric arteries are normal. HEPATOBILIARY: Liver, status post cholecystectomy. PANCREAS: Normal. SPLEEN: Normal, heterogeneous enhancement due to arterial bolus timing. ADRENALS/URINARY TRACT: Kidneys are orthotopic, demonstrating symmetric enhancement. RIGHT extra renal pelvis. No nephrolithiasis, hydronephrosis or solid renal masses. Normal adrenal glands. STOMACH/BOWEL: Mildly prominent proximal jejunum at 3.4 cm. The stomach, included large bowel are normal in course and caliber without inflammatory changes. Small amount of small bowel feces compatible with chronic stasis. Moderate retained large bowel stool. Normal appendix. VASCULAR/LYMPHATIC: No lymphadenopathy by CT size criteria. "Misty" mesentery with a few small reactive lymph nodes. OTHER: No intraperitoneal free fluid or free air. MUSCULOSKELETAL: Nonacute. Osteopenia.  Mild broad lumbar levoscoliosis. Moderate lower lumbar facet arthropathy. Review of the MIP images confirms the above findings. IMPRESSION: CTA CHEST: No acute vascular process or acute cardiopulmonary disease. Mild atherosclerosis of the thoracic aorta. **An incidental finding of potential clinical significance has been found. 11 mm solitary ground-glass nodule RIGHT lower lobe for which follow-up CT at 6-12 months is recommended to confirm persistence. This recommendation follows the consensus statement: Guidelines for Management of Small Pulmonary Nodules Detected on CT Images: From the Fleischner Society 2017; Radiology 2017; 284:228-243.** CTA ABDOMEN AND PELVIS: No acute vascular process. Moderate atherosclerosis of the abdominal aorta. Mildly dilated jejunum associated with "misty" mesentery suggesting mild ileus and reactive changes. Moderate retained large bowel stool without bowel obstruction. Electronically Signed   By: Elon Alas M.D.   On: 10/15/2016 18:40   Ct Angio Chest Aorta W And/or Wo Contrast  Result Date: 10/15/2016 CLINICAL DATA:  Sudden severe chest and epigastric pain radiating to the back. History of hypertension, hyperlipidemia. EXAM: CT ANGIOGRAPHY CHEST AND ABDOMEN TECHNIQUE: Multidetector CT imaging of the chest and abdomen was performed using the standard protocol during bolus administration of intravenous contrast. Multiplanar CT image reconstructions and MIPs were obtained to evaluate the vascular anatomy. CONTRAST:  100 cc Isovue 370 COMPARISON:  Chest radiograph October 15, 2016 at 1517 hours FINDINGS: CTA CHEST FINDINGS CARDIOVASCULAR: Thoracic aorta is normal course and caliber, mild calcific atherosclerosis. No intrinsic density on noncontrast CT. Homogeneous contrast opacification of thoracic aorta without dissection, aneurysm, luminal irregularity, periaortic fluid collections, or contrast extravasation. Heart size is normal. Mild coronary artery calcifications. No  pericardial effusion. Though not tailored for evaluation, no central pulmonary embolus. MEDIASTINUM/NODES: No mediastinal mass or lymphadenopathy by CT size criteria. LUNGS/PLEURA: Tracheobronchial tree is patent, no pneumothorax. No pleural effusions, focal consolidations, or masses. 9 mm irregular ground-glass nodule RIGHT lower lobe, axial 27/57. Dependent atelectasis. MUSCULOSKELETAL:  Nonsuspicious.  Bone island RIGHT humeral head. Review of the MIP images confirms the above findings. CTA ABDOMEN AND PELVIS FINDINGS ARTERIES: Abdominal aorta is normal course and caliber, moderate calcific atherosclerosis. Homogeneous contrast opacification of aortoiliac vessels without dissection, aneurysm, luminal irregularity, periaortic fluid collections, or contrast extravasation. Celiac axis, superior and inferior mesenteric arteries are normal. HEPATOBILIARY:  Liver, status post cholecystectomy. PANCREAS: Normal. SPLEEN: Normal, heterogeneous enhancement due to arterial bolus timing. ADRENALS/URINARY TRACT: Kidneys are orthotopic, demonstrating symmetric enhancement. RIGHT extra renal pelvis. No nephrolithiasis, hydronephrosis or solid renal masses. Normal adrenal glands. STOMACH/BOWEL: Mildly prominent proximal jejunum at 3.4 cm. The stomach, included large bowel are normal in course and caliber without inflammatory changes. Small amount of small bowel feces compatible with chronic stasis. Moderate retained large bowel stool. Normal appendix. VASCULAR/LYMPHATIC: No lymphadenopathy by CT size criteria. "Misty" mesentery with a few small reactive lymph nodes. OTHER: No intraperitoneal free fluid or free air. MUSCULOSKELETAL: Nonacute. Osteopenia. Mild broad lumbar levoscoliosis. Moderate lower lumbar facet arthropathy. Review of the MIP images confirms the above findings. IMPRESSION: CTA CHEST: No acute vascular process or acute cardiopulmonary disease. Mild atherosclerosis of the thoracic aorta. **An incidental finding of  potential clinical significance has been found. 11 mm solitary ground-glass nodule RIGHT lower lobe for which follow-up CT at 6-12 months is recommended to confirm persistence. This recommendation follows the consensus statement: Guidelines for Management of Small Pulmonary Nodules Detected on CT Images: From the Fleischner Society 2017; Radiology 2017; 284:228-243.** CTA ABDOMEN AND PELVIS: No acute vascular process. Moderate atherosclerosis of the abdominal aorta. Mildly dilated jejunum associated with "misty" mesentery suggesting mild ileus and reactive changes. Moderate retained large bowel stool without bowel obstruction. Electronically Signed   By: Elon Alas M.D.   On: 10/15/2016 18:40    ____________________________________________   PROCEDURES Procedures  ____________________________________________   INITIAL IMPRESSION / ASSESSMENT AND PLAN / ED COURSE  Pertinent labs & imaging results that were available during my care of the patient were reviewed by me and considered in my medical decision making (see chart for details).  Patient well appearing not in any distress. Pain is improved after aspirin and nitroglycerin. Pain is very atypical and not consistent with ACS or PE. Aortic dissection is on the differential but currently I think there is a low likelihood. I did discuss performing a CT angiogram with the patient, she declines on initial assessment. Advised her that we would continue to monitor her symptoms in the emergency department for a few hours while waiting to perform a serial troponin. Patient given antiacid medications as well to see if this would help her symptoms.  ----------------------------------------- 6:36 PM on 10/15/2016 -----------------------------------------  Patient reports that she has been passing gas over the past hour and a half to 2 hours. She does feel better. At times pain was resolved, but currently she is still having some significant pain. I  recommended we performed a CT angiogram which she agrees to. Repeat troponin pending as well. If workup is negative I think patient is suitable for outpatient follow-up. She does have questions about changing her Lexapro dose possibly increasing or changing it to another medication. She has been referred to psychiatry in the past which I encouraged her to continue to do.    ----------------------------------------- 7:24 PM on 10/15/2016 -----------------------------------------  CT angiogram negative. Patient informed of incidental finding of pulmonary nodule and follow-up recommendation. Repeat troponin negative. We'll discharge home follow up with primary care. Patient will try taking her Ativan as prescribed for her symptoms. Return precautions given.       ____________________________________________   FINAL CLINICAL IMPRESSION(S) / ED DIAGNOSES  Final diagnoses:  Nonspecific chest pain      Discharge Medication List as of 10/15/2016  7:17 PM       Portions of this note were generated with dragon dictation software. Dictation errors may  occur despite best attempts at proofreading.    Carrie Mew, MD 10/15/16 [id number]

## 2016-10-15 NOTE — ED Notes (Signed)
Patient transported to CT 

## 2016-10-18 ENCOUNTER — Encounter: Payer: Self-pay | Admitting: Family Medicine

## 2016-10-18 ENCOUNTER — Ambulatory Visit (INDEPENDENT_AMBULATORY_CARE_PROVIDER_SITE_OTHER): Payer: Medicare Other | Admitting: Family Medicine

## 2016-10-18 VITALS — BP 144/78 | HR 82 | Temp 98.1°F | Resp 16 | Wt 145.3 lb

## 2016-10-18 DIAGNOSIS — I10 Essential (primary) hypertension: Secondary | ICD-10-CM

## 2016-10-18 DIAGNOSIS — F418 Other specified anxiety disorders: Secondary | ICD-10-CM

## 2016-10-18 DIAGNOSIS — E782 Mixed hyperlipidemia: Secondary | ICD-10-CM | POA: Diagnosis not present

## 2016-10-18 DIAGNOSIS — I2510 Atherosclerotic heart disease of native coronary artery without angina pectoris: Secondary | ICD-10-CM | POA: Diagnosis not present

## 2016-10-18 DIAGNOSIS — Z1159 Encounter for screening for other viral diseases: Secondary | ICD-10-CM

## 2016-10-18 DIAGNOSIS — I700 Atherosclerosis of aorta: Secondary | ICD-10-CM | POA: Diagnosis not present

## 2016-10-18 DIAGNOSIS — Z5181 Encounter for therapeutic drug level monitoring: Secondary | ICD-10-CM | POA: Diagnosis not present

## 2016-10-18 DIAGNOSIS — D6489 Other specified anemias: Secondary | ICD-10-CM | POA: Diagnosis not present

## 2016-10-18 DIAGNOSIS — R4589 Other symptoms and signs involving emotional state: Secondary | ICD-10-CM

## 2016-10-18 DIAGNOSIS — Z1239 Encounter for other screening for malignant neoplasm of breast: Secondary | ICD-10-CM

## 2016-10-18 DIAGNOSIS — Z1231 Encounter for screening mammogram for malignant neoplasm of breast: Secondary | ICD-10-CM | POA: Diagnosis not present

## 2016-10-18 DIAGNOSIS — R7301 Impaired fasting glucose: Secondary | ICD-10-CM | POA: Diagnosis not present

## 2016-10-18 DIAGNOSIS — R911 Solitary pulmonary nodule: Secondary | ICD-10-CM | POA: Diagnosis not present

## 2016-10-18 HISTORY — DX: Solitary pulmonary nodule: R91.1

## 2016-10-18 LAB — LIPID PANEL
CHOL/HDL RATIO: 2.2 ratio (ref <5.0)
Cholesterol: 166 mg/dL (ref <200)
HDL: 76 mg/dL (ref >50)
LDL CALC: 78 mg/dL (ref <100)
Triglycerides: 59 mg/dL (ref <150)
VLDL: 12 mg/dL (ref <30)

## 2016-10-18 LAB — HEPATITIS C ANTIBODY: HCV Ab: NEGATIVE

## 2016-10-18 NOTE — Assessment & Plan Note (Signed)
Goal LDL less than 70; followed by Dr. Ubaldo Glassing

## 2016-10-18 NOTE — Patient Instructions (Addendum)
We'll check labs today We'll refer you to the CT surgeon to follow you scan findings If you have not heard anything from my staff in a week about any orders/referrals/studies from today, please contact us here to follow-up [phone] Do talk with Dr. Ubaldo Glassing about your blood pressure Try to follow the DASH guidelines (DASH stands for Dietary Approaches to Stop Hypertension) Try to limit the sodium in your diet.  Ideally, consume less than 1.5 grams (less than 1,500mg ) per day. Do not add salt when cooking or at the table.  Check the sodium amount on labels when shopping, and choose items lower in sodium when given a choice. Avoid or limit foods that already contain a lot of sodium. Eat a diet rich in fruits and vegetables and whole grains.    Steps to Elicit the Relaxation Response The following is the technique reprinted with permission from Dr. Billie Ruddy book The Relaxation Response pages 162-163 1. Sit quietly in a comfortable position. 2. Close your eyes. 3. Deeply relax all your muscles,  beginning at your feet and progressing up to your face.  Keep them relaxed. 4. Breathe through your nose.  Become aware of your breathing.  As you breathe out, say the word, "one"*,  silently to yourself. For example,  breathe in ... out, "one",- in .. out, "one", etc.  Breathe easily and naturally. 5. Continue for 10 to 20 minutes.  You may open your eyes to check the time, but do not use an alarm.  When you finish, sit quietly for several minutes,  at first with your eyes closed and later with your eyes opened.  Do not stand up for a few minutes. 6. Do not worry about whether you are successful  in achieving a deep level of relaxation.  Maintain a passive attitude and permit relaxation to occur at its own pace.  When distracting thoughts occur,  try to ignore them by not dwelling upon them  and return to repeating "one."  With practice, the response should come with little effort.   Practice the technique once or twice daily,  but not within two hours after any meal,  since the digestive processes seem to interfere with  the elicitation of the Relaxation Response. * It is better to use a soothing, mellifluous sound, preferably with no meaning. or association, to avoid stimulation of unnecessary thoughts - a mantra.  DASH Eating Plan DASH stands for "Dietary Approaches to Stop Hypertension." The DASH eating plan is a healthy eating plan that has been shown to reduce high blood pressure (hypertension). It may also reduce your risk for type 2 diabetes, heart disease, and stroke. The DASH eating plan may also help with weight loss. What are tips for following this plan? General guidelines   Avoid eating more than 2,300 mg (milligrams) of salt (sodium) a day. If you have hypertension, you may need to reduce your sodium intake to 1,500 mg a day.  Limit alcohol intake to no more than 1 drink a day for nonpregnant women and 2 drinks a day for men. One drink equals 12 oz of beer, 5 oz of wine, or 1 oz of hard liquor.  Work with your health care provider to maintain a healthy body weight or to lose weight. Ask what an ideal weight is for you.  Get at least 30 minutes of exercise that causes your heart to beat faster (aerobic exercise) most days of the week. Activities may include walking, swimming, or biking.  Work with your health care provider or diet and nutrition specialist (dietitian) to adjust your eating plan to your individual calorie needs. Reading food labels   Check food labels for the amount of sodium per serving. Choose foods with less than 5 percent of the Daily Value of sodium. Generally, foods with less than 300 mg of sodium per serving fit into this eating plan.  To find whole grains, look for the word "whole" as the first word in the ingredient list. Shopping   Buy products labeled as "low-sodium" or "no salt added."  Buy fresh foods. Avoid canned foods  and premade or frozen meals. Cooking   Avoid adding salt when cooking. Use salt-free seasonings or herbs instead of table salt or sea salt. Check with your health care provider or pharmacist before using salt substitutes.  Do not fry foods. Cook foods using healthy methods such as baking, boiling, grilling, and broiling instead.  Cook with heart-healthy oils, such as olive, canola, soybean, or sunflower oil. Meal planning    Eat a balanced diet that includes:  5 or more servings of fruits and vegetables each day. At each meal, try to fill half of your plate with fruits and vegetables.  Up to 6-8 servings of whole grains each day.  Less than 6 oz of lean meat, poultry, or fish each day. A 3-oz serving of meat is about the same size as a deck of cards. One egg equals 1 oz.  2 servings of low-fat dairy each day.  A serving of nuts, seeds, or beans 5 times each week.  Heart-healthy fats. Healthy fats called Omega-3 fatty acids are found in foods such as flaxseeds and coldwater fish, like sardines, salmon, and mackerel.  Limit how much you eat of the following:  Canned or prepackaged foods.  Food that is high in trans fat, such as fried foods.  Food that is high in saturated fat, such as fatty meat.  Sweets, desserts, sugary drinks, and other foods with added sugar.  Full-fat dairy products.  Do not salt foods before eating.  Try to eat at least 2 vegetarian meals each week.  Eat more home-cooked food and less restaurant, buffet, and fast food.  When eating at a restaurant, ask that your food be prepared with less salt or no salt, if possible. What foods are recommended? The items listed may not be a complete list. Talk with your dietitian about what dietary choices are best for you. Grains  Whole-grain or whole-wheat bread. Whole-grain or whole-wheat pasta. Brown rice. [place]. Bulgur. Whole-grain and low-sodium cereals. Pita bread. Low-fat, low-sodium crackers.  Whole-wheat flour tortillas. Vegetables  Fresh or frozen vegetables (raw, steamed, roasted, or grilled). Low-sodium or reduced-sodium tomato and vegetable juice. Low-sodium or reduced-sodium tomato sauce and tomato paste. Low-sodium or reduced-sodium canned vegetables. Fruits  All fresh, dried, or frozen fruit. Canned fruit in natural juice (without added sugar). Meat and other protein foods  Skinless chicken or [place]. Ground chicken or [place]. Pork with fat trimmed off. Fish and seafood. Egg whites. Dried beans, peas, or lentils. Unsalted nuts, nut butters, and seeds. Unsalted canned beans. Lean cuts of beef with fat trimmed off. Low-sodium, lean deli meat. Dairy  Low-fat (1%) or fat-free (skim) milk. Fat-free, low-fat, or reduced-fat cheeses. Nonfat, low-sodium ricotta or cottage cheese. Low-fat or nonfat yogurt. Low-fat, low-sodium cheese. Fats and oils  Soft margarine without trans fats. Vegetable oil. Low-fat, reduced-fat, or light mayonnaise and salad dressings (reduced-sodium). Canola, safflower, olive, soybean, and sunflower  oils. Avocado. Seasoning and other foods  Herbs. Spices. Seasoning mixes without salt. Unsalted popcorn and pretzels. Fat-free sweets. What foods are not recommended? The items listed may not be a complete list. Talk with your dietitian about what dietary choices are best for you. Grains  Baked goods made with fat, such as croissants, muffins, or some breads. Dry pasta or rice meal packs. Vegetables  Creamed or fried vegetables. Vegetables in a cheese sauce. Regular canned vegetables (not low-sodium or reduced-sodium). Regular canned tomato sauce and paste (not low-sodium or reduced-sodium). Regular tomato and vegetable juice (not low-sodium or reduced-sodium). Angie Fava. Olives. Fruits  Canned fruit in a light or heavy syrup. Fried fruit. Fruit in cream or butter sauce. Meat and other protein foods  Fatty cuts of meat. Ribs. Fried meat. Berniece Salines. Sausage. Bologna and  other processed lunch meats. Salami. Fatback. Hotdogs. Bratwurst. Salted nuts and seeds. Canned beans with added salt. Canned or smoked fish. Whole eggs or egg yolks. Chicken or [place] with skin. Dairy  Whole or 2% milk, cream, and half-and-half. Whole or full-fat cream cheese. Whole-fat or sweetened yogurt. Full-fat cheese. Nondairy creamers. Whipped toppings. Processed cheese and cheese spreads. Fats and oils  Butter. Stick margarine. Lard. Shortening. Ghee. Bacon fat. Tropical oils, such as coconut, palm kernel, or palm oil. Seasoning and other foods  Salted popcorn and pretzels. Onion salt, garlic salt, seasoned salt, table salt, and sea salt. Worcestershire sauce. Tartar sauce. Barbecue sauce. Teriyaki sauce. Soy sauce, including reduced-sodium. Steak sauce. Canned and packaged gravies. Fish sauce. Oyster sauce. Cocktail sauce. Horseradish that you find on the shelf. Ketchup. Mustard. Meat flavorings and tenderizers. Bouillon cubes. Hot sauce and Tabasco sauce. Premade or packaged marinades. Premade or packaged taco seasonings. Relishes. Regular salad dressings. Where to find more information:  National Heart, Lung, and [place]: https://wilson-eaton.com/  American Heart Association: www.heart.org Summary  The DASH eating plan is a healthy eating plan that has been shown to reduce high blood pressure (hypertension). It may also reduce your risk for type 2 diabetes, heart disease, and stroke.  With the DASH eating plan, you should limit salt (sodium) intake to 2,300 mg a day. If you have hypertension, you may need to reduce your sodium intake to 1,500 mg a day.  When on the DASH eating plan, aim to eat more fresh fruits and vegetables, whole grains, lean proteins, low-fat dairy, and heart-healthy fats.  Work with your health care provider or diet and nutrition specialist (dietitian) to adjust your eating plan to your individual calorie needs. This information is not intended to replace advice  given to you by your health care provider. Make sure you discuss any questions you have with your health care provider. Document Released: 07/18/2011 Document Revised: 07/22/2016 Document Reviewed: 07/22/2016 Elsevier Interactive Patient Education  2017 [employer].

## 2016-10-18 NOTE — Assessment & Plan Note (Addendum)
Referral to CT surgeon; she'll see psychiatrist soon; continue SSRI; I demonstrated relaxation response and gave her info, encouraged meditation; use the benzo for only truly stormy moments (she has five left from last April she says, no new Rx given)

## 2016-10-18 NOTE — Assessment & Plan Note (Signed)
Check labs 

## 2016-10-18 NOTE — Assessment & Plan Note (Signed)
Managed by Dr. Ubaldo Glassing; try DASH guidelines

## 2016-10-18 NOTE — Assessment & Plan Note (Signed)
Check glucose and A1c today; try to limit sweets

## 2016-10-18 NOTE — Assessment & Plan Note (Signed)
Check lipids today; goal LDL is less than 70; avoid saturated fats

## 2016-10-18 NOTE — Assessment & Plan Note (Signed)
Refer to CT surgeon for management of the lung nodule, especially with her sister's history

## 2016-10-18 NOTE — Assessment & Plan Note (Signed)
Chronic; last 3 CBCs reviewed

## 2016-10-18 NOTE — Assessment & Plan Note (Signed)
Goal LDL is less than 70; healthy diet, activity, weight loss; continue aspirin

## 2016-10-18 NOTE — Progress Notes (Signed)
BP (!) 144/78   Pulse 82   Temp 98.1 F (36.7 C) (Oral)   Resp 16   Wt 145 lb 5 oz (65.9 kg)   SpO2 97%   BMI 26.58 kg/m    Subjective:    Patient ID: Lydia Elliott, female    DOB: [birth year], 66 y.o.   MRN: [id number]  HPI: Lydia Elliott is a 66 y.o. female  Chief Complaint  Patient presents with  . Hospitalization Follow-up    Chest pain, back pain and BP was 215/85. DX Anxiety.   . Blood Sugar Problem    Runs in the family; Would like it checked   Patient is here for ER follow-up  She was in the ER recently for chest pain; turned out to be anxiety; her BP was really high then; 215/85 They gave her medicine to bring it dow She sees cardiologist, Dr. Ubaldo Glassing, and she called the nurse just the other day about her visit and her nodule He has been adjusting her blood pressure medicine; he thought she might have been over-medicated; she'll f/u with him about her BP  She is going to see the psychiatrist on May 1st She is very anxious about this diagnosis of the lung nodule She was given ativan from last April and has taken just a half of a pill when she flies; she could have used one the other day but was afraid to just take it; has five left  CLINICAL DATA:  Sudden severe chest and epigastric pain radiating to the back. History of hypertension, hyperlipidemia.  EXAM: CT ANGIOGRAPHY CHEST AND ABDOMEN  TECHNIQUE: Multidetector CT imaging of the chest and abdomen was performed using the standard protocol during bolus administration of intravenous contrast. Multiplanar CT image reconstructions and MIPs were obtained to evaluate the vascular anatomy.  CONTRAST:  100 cc Isovue 370  COMPARISON:  Chest radiograph October 15, 2016 at 1517 hours  FINDINGS: CTA CHEST FINDINGS  CARDIOVASCULAR: Thoracic aorta is normal course and caliber, mild calcific atherosclerosis. No intrinsic density on noncontrast CT. Homogeneous contrast opacification of thoracic aorta  without dissection, aneurysm, luminal irregularity, periaortic fluid collections, or contrast extravasation. Heart size is normal. Mild coronary artery calcifications. No pericardial effusion. Though not tailored for evaluation, no central pulmonary embolus.  MEDIASTINUM/NODES: No mediastinal mass or lymphadenopathy by CT size criteria.  LUNGS/PLEURA: Tracheobronchial tree is patent, no pneumothorax. No pleural effusions, focal consolidations, or masses. 9 mm irregular ground-glass nodule RIGHT lower lobe, axial 27/57. Dependent atelectasis.  MUSCULOSKELETAL:  Nonsuspicious.  Bone island RIGHT humeral head.  Review of the MIP images confirms the above findings.  CTA ABDOMEN AND PELVIS FINDINGS  ARTERIES: Abdominal aorta is normal course and caliber, moderate calcific atherosclerosis. Homogeneous contrast opacification of aortoiliac vessels without dissection, aneurysm, luminal irregularity, periaortic fluid collections, or contrast extravasation. Celiac axis, superior and inferior mesenteric arteries are normal.  HEPATOBILIARY: Liver, status post cholecystectomy.  PANCREAS: Normal.  SPLEEN: Normal, heterogeneous enhancement due to arterial bolus timing.  ADRENALS/URINARY TRACT: Kidneys are orthotopic, demonstrating symmetric enhancement. RIGHT extra renal pelvis. No nephrolithiasis, hydronephrosis or solid renal masses. Normal adrenal glands.  STOMACH/BOWEL: Mildly prominent proximal jejunum at 3.4 cm. The stomach, included large bowel are normal in course and caliber without inflammatory changes. Small amount of small bowel feces compatible with chronic stasis. Moderate retained large bowel stool. Normal appendix.  VASCULAR/LYMPHATIC: No lymphadenopathy by CT size criteria. "Misty" mesentery with a few small reactive lymph nodes.  OTHER: No intraperitoneal free fluid or free air.  MUSCULOSKELETAL: Nonacute. Osteopenia. Mild broad lumbar levoscoliosis.  Moderate lower lumbar facet arthropathy.  Review of the MIP images confirms the above findings.  IMPRESSION: CTA CHEST: No acute vascular process or acute cardiopulmonary disease. Mild atherosclerosis of the thoracic aorta.  **An incidental finding of potential clinical significance has been found. 11 mm solitary ground-glass nodule RIGHT lower lobe for which follow-up CT at 6-12 months is recommended to confirm persistence. This recommendation follows the consensus statement: Guidelines for Management of Small Pulmonary Nodules Detected on CT Images: From the Fleischner Society 2017; Radiology 2017; 284:228-243.**  CTA ABDOMEN AND PELVIS: No acute vascular process. Moderate atherosclerosis of the abdominal aorta.  Mildly dilated jejunum associated with "misty" mesentery suggesting mild ileus and reactive changes.  Moderate retained large bowel stool without bowel obstruction.   Electronically Signed   By: Elon Alas M.D.   On: 10/15/2016 18:40   Lots of stool on that scan; using miralax since then  Depression screen [facility] 2/9 10/18/2016 04/22/2016 11/27/2015  Decreased Interest 0 1 0  Down, Depressed, Hopeless 0 1 0  PHQ - 2 Score 0 2 0  Altered sleeping - 0 -  Tired, decreased energy - 0 -  Change in appetite - 0 -  Feeling bad or failure about yourself  - 3 -  Trouble concentrating - 0 -  Moving slowly or fidgety/restless - 0 -  Suicidal thoughts - 0 -  PHQ-9 Score - 5 -  Difficult doing work/chores - Not difficult at all -   Relevant past medical, surgical, family and social history reviewed Past Medical History:  Diagnosis Date  . Arthritis    hands, knees, toes  . CAD (coronary artery disease)   . Chronic anemia   . GERD (gastroesophageal reflux disease)   . Heart disease    has a stent  . Hyperlipidemia   . Hypertension   . Insomnia   . Microcytosis    Hgb 10.9, MCV 66 April 2015  . Nodule of right lung 10/18/2016  . Overweight (BMI  25.0-29.9) 12/23/2015  . Polyarthralgia   . Thalassemia   . Vertigo    none - 10 yrs  . Wears dentures    partial lower   Past Surgical History:  Procedure Laterality Date  . APPENDECTOMY  1967  . CARPAL TUNNEL RELEASE  2007  . CATARACT EXTRACTION  2013  . CHOLECYSTECTOMY  2000  . COLONOSCOPY  7/31//15   Dr.Wohl  . COLONOSCOPY WITH PROPOFOL N/A 12/11/2015   Procedure: COLONOSCOPY WITH PROPOFOL;  Surgeon: Lucilla Lame, MD;  Location: [place];  Service: Endoscopy;  Laterality: N/A;  DESCENDING COLON POLYP   . CORONARY ANGIOPLASTY WITH STENT PLACEMENT  2010  . ESOPHAGOGASTRODUODENOSCOPY  02/21/14   Dr.Wohl  . ESOPHAGOGASTRODUODENOSCOPY (EGD) WITH PROPOFOL N/A 12/11/2015   Procedure: ESOPHAGOGASTRODUODENOSCOPY (EGD) WITH PROPOFOL;  Surgeon: Lucilla Lame, MD;  Location: [place];  Service: Endoscopy;  Laterality: N/A;  . POLYPECTOMY N/A 12/11/2015   Procedure: POLYPECTOMY;  Surgeon: Lucilla Lame, MD;  Location: [place];  Service: Endoscopy;  Laterality: N/A;  . TONSILLECTOMY AND ADENOIDECTOMY  1969   Family History  Problem Relation Age of Onset  . Cirrhosis Mother   . Hepatitis Mother   . Thalassemia Mother   . Diabetes Mother   . Heart disease Father   . Diabetes Father   . Hyperlipidemia Father   . Hypertension Father   . Stroke Father   . Cancer Sister     lung  .  Diabetes Sister   . Stroke Sister     during a surgery  . Diabetes Brother   . Heart disease Brother   . Hypertension Brother   . Stroke Brother   . Thalassemia Maternal Grandmother   . COPD Neg Hx    Social History  Substance Use Topics  . Smoking status: Former Smoker    Types: Cigarettes  . Smokeless tobacco: Never Used     Comment: Social smoker   . Alcohol use 0.6 oz/week    1 Shots of liquor per week     Comment: socially  MD note: rare alcohol, 1-2 a month  Interim medical history since last visit reviewed. Allergies and medications reviewed  Review of  Systems Per HPI unless specifically indicated above     Objective:    BP (!) 144/78   Pulse 82   Temp 98.1 F (36.7 C) (Oral)   Resp 16   Wt 145 lb 5 oz (65.9 kg)   SpO2 97%   BMI 26.58 kg/m   Wt Readings from Last 3 Encounters:  10/18/16 145 lb 5 oz (65.9 kg)  10/15/16 145 lb (65.8 kg)  04/22/16 144 lb (65.3 kg)    Physical Exam  Constitutional: She appears well-developed and well-nourished. No distress.  Overweight, weight stable  HENT:  Head: Normocephalic and atraumatic.  Eyes: EOM are normal. No scleral icterus.  Neck: No thyromegaly present.  Cardiovascular: Normal rate, regular rhythm and normal heart sounds.   Pulmonary/Chest: Effort normal and breath sounds normal. She has no wheezes.  Abdominal: Soft. Bowel sounds are normal. She exhibits no distension.  Neurological: She is alert. She displays no tremor.  Fidgety; no tics  Skin: Skin is warm and dry. She is not diaphoretic. No pallor.  Psychiatric: Her behavior is normal. Judgment and thought content normal. Her mood appears anxious. She does not exhibit a depressed mood.   Results for orders placed or performed during the hospital encounter of 62/69/48  Basic metabolic panel  Result Value Ref Range   Sodium 135 135 - 145 mmol/L   Potassium 4.0 3.5 - 5.1 mmol/L   Chloride 101 101 - 111 mmol/L   CO2 25 22 - 32 mmol/L   Glucose, Bld 107 (H) 65 - 99 mg/dL   BUN 23 (H) 6 - 20 mg/dL   Creatinine, Ser 0.59 0.44 - 1.00 mg/dL   Calcium 9.4 8.9 - 10.3 mg/dL   GFR calc non Af Amer >60 >60 mL/min   GFR calc Af Amer >60 >60 mL/min   Anion gap 9 5 - 15  CBC  Result Value Ref Range   WBC 10.0 3.6 - 11.0 K/uL   RBC 4.99 3.80 - 5.20 MIL/uL   Hemoglobin 10.6 (L) 12.0 - 16.0 g/dL   HCT 32.7 (L) 35.0 - 47.0 %   MCV 65.6 (L) 80.0 - 100.0 fL   MCH 21.2 (L) 26.0 - 34.0 pg   MCHC 32.3 32.0 - 36.0 g/dL   RDW 14.9 (H) 11.5 - 14.5 %   Platelets 292 150 - 440 K/uL  Troponin I  Result Value Ref Range   Troponin I <0.03  <0.03 ng/mL  Hepatic function panel  Result Value Ref Range   Total Protein 7.2 6.5 - 8.1 g/dL   Albumin 4.2 3.5 - 5.0 g/dL   AST 18 15 - 41 U/L   ALT 16 14 - 54 U/L   Alkaline Phosphatase 60 38 - 126 U/L   Total Bilirubin 1.0 0.3 -  1.2 mg/dL   Bilirubin, Direct 0.1 0.1 - 0.5 mg/dL   Indirect Bilirubin 0.9 0.3 - 0.9 mg/dL  Lipase, blood  Result Value Ref Range   Lipase 36 11 - 51 U/L  Troponin I  Result Value Ref Range   Troponin I <0.03 <0.03 ng/mL      Assessment & Plan:   Problem List Items Addressed This Visit      Cardiovascular and Mediastinum   Hypertension    Managed by Dr. Ubaldo Glassing; try DASH guidelines      CAD (coronary artery disease)    Goal LDL less than 70; followed by Dr. Ubaldo Glassing      Aortic atherosclerosis ([place])    Goal LDL is less than 70; healthy diet, activity, weight loss; continue aspirin        Endocrine   Impaired fasting glucose    Check glucose and A1c today; try to limit sweets      Relevant Orders   Hemoglobin A1c     Other   Nodule of right lung - Primary    Refer to CT surgeon for management of the lung nodule, especially with her sister's history      Relevant Orders   Ambulatory referral to Cardiothoracic Surgery   Medication monitoring encounter    Check labs      Hyperlipidemia    Check lipids today; goal LDL is less than 70; avoid saturated fats      Relevant Orders   Lipid panel   Anxiety about health    Referral to CT surgeon; she'll see psychiatrist soon; continue SSRI; I demonstrated relaxation response and gave her info, encouraged meditation; use the benzo for only truly stormy moments (she has five left from last April she says, no new Rx given)      Anemia    Chronic; last 3 CBCs reviewed       Other Visit Diagnoses    Screening for breast cancer       Relevant Orders   MM Digital Screening   Need for hepatitis C screening test       Relevant Orders   Hepatitis C Antibody       Follow up plan: Return  in about 6 months (around 04/20/2017) for regular follow-up, but certainly sooner if needed.  An after-visit summary was printed and given to the patient at [facility].  Please see the patient instructions which may contain other information and recommendations beyond what is mentioned above in the assessment and plan.  No orders of the defined types were placed in this encounter.   Orders Placed This Encounter  Procedures  . MM Digital Screening  . Hepatitis C Antibody  . Lipid panel  . Hemoglobin A1c  . Ambulatory referral to Cardiothoracic Surgery

## 2016-10-19 LAB — HEMOGLOBIN A1C
HEMOGLOBIN A1C: 5.5 % (ref <5.7)
Mean Plasma Glucose: 111 mg/dL

## 2016-11-15 ENCOUNTER — Encounter: Payer: Self-pay | Admitting: Cardiothoracic Surgery

## 2016-11-15 ENCOUNTER — Ambulatory Visit (INDEPENDENT_AMBULATORY_CARE_PROVIDER_SITE_OTHER): Payer: Medicare Other | Admitting: Cardiothoracic Surgery

## 2016-11-15 VITALS — BP 178/74 | HR 83 | Temp 98.3°F | Resp 14 | Ht 62.0 in | Wt 147.0 lb

## 2016-11-15 DIAGNOSIS — I2510 Atherosclerotic heart disease of native coronary artery without angina pectoris: Secondary | ICD-10-CM

## 2016-11-15 DIAGNOSIS — R918 Other nonspecific abnormal finding of lung field: Secondary | ICD-10-CM | POA: Diagnosis not present

## 2016-11-15 NOTE — Progress Notes (Signed)
Patient ID: Lydia Elliott, female   DOB: [birth year], 66 y.o.   MRN: [id number]  Chief Complaint  Patient presents with  . New Patient (Initial Visit)    Right Lung Nodule    Referred By Dr. Enid Derry Reason for Referral right lower lobe pulmonary nodule  HPI Location, Quality, Duration, Severity, Timing, Context, Modifying Factors, Associated Signs and Symptoms.  Lydia Elliott is a 66 y.o. female.  She has a very remote history of a short-term smoking having quit about 40 years ago and having smoked only for about a year or 2. She does have a history of hypertension and anxiety and was in her usual state of health until she experienced what she thought was an acute anxiety attack followed by hypertension tachycardia and shortness of breath. She presented to the emergency department where she was found to be tachycardic and had some medications provided to her for that. Because of her chest pain and hypertension she underwent a chest CT to rule out aortic dissection. The chest CT did demonstrate a small nodule along the fissure in the right lower lobe measuring about 7 date millimeters in size. There is no associated mediastinal adenopathy. There is no other pathology identified in the heart or lungs. She was seen by her primary care physician Dr. Enid Derry who recommended that she follow-up with me for my opinion regarding the management of this nodule.  The patient does not get short of breath. She's not had any hemoptysis. She does have an extensive family history of lung cancer. She had a husband and sister who died of lung cancer. She also had a nephew who died of lung cancer. She has had no significant exposure to secondhand smoke however and she does not have any history of asbestos exposure.   Past Medical History:  Diagnosis Date  . Arthritis    hands, knees, toes  . CAD (coronary artery disease)   . Chronic anemia   . GERD (gastroesophageal reflux disease)   . Heart disease    has a stent  . Hyperlipidemia   . Hypertension   . Insomnia   . Microcytosis    Hgb 10.9, MCV 66 April 2015  . Nodule of right lung 10/18/2016  . Overweight (BMI 25.0-29.9) 12/23/2015  . Polyarthralgia   . Thalassemia   . Vertigo    none - 10 yrs  . Wears dentures    partial lower    Past Surgical History:  Procedure Laterality Date  . APPENDECTOMY  1967  . CARPAL TUNNEL RELEASE  2007  . CATARACT EXTRACTION  2013  . CHOLECYSTECTOMY  2000  . COLONOSCOPY  7/31//15   Dr.Wohl  . COLONOSCOPY WITH PROPOFOL N/A 12/11/2015   Procedure: COLONOSCOPY WITH PROPOFOL;  Surgeon: Lucilla Lame, MD;  Location: [place];  Service: Endoscopy;  Laterality: N/A;  DESCENDING COLON POLYP   . CORONARY ANGIOPLASTY WITH STENT PLACEMENT  2010  . ESOPHAGOGASTRODUODENOSCOPY  02/21/14   Dr.Wohl  . ESOPHAGOGASTRODUODENOSCOPY (EGD) WITH PROPOFOL N/A 12/11/2015   Procedure: ESOPHAGOGASTRODUODENOSCOPY (EGD) WITH PROPOFOL;  Surgeon: Lucilla Lame, MD;  Location: [place];  Service: Endoscopy;  Laterality: N/A;  . POLYPECTOMY N/A 12/11/2015   Procedure: POLYPECTOMY;  Surgeon: Lucilla Lame, MD;  Location: [place];  Service: Endoscopy;  Laterality: N/A;  . TONSILLECTOMY AND ADENOIDECTOMY  1969    Family History  Problem Relation Age of Onset  . Cirrhosis Mother   . Hepatitis Mother   . Thalassemia Mother   . Diabetes  Mother   . Heart disease Father   . Diabetes Father   . Hyperlipidemia Father   . Hypertension Father   . Stroke Father   . Cancer Sister     lung  . Diabetes Sister   . Stroke Sister     during a surgery  . Diabetes Brother   . Heart disease Brother   . Hypertension Brother   . Stroke Brother   . Thalassemia Maternal Grandmother   . COPD Neg Hx     Social History Social History  Substance Use Topics  . Smoking status: Former Smoker    Types: Cigarettes  . Smokeless tobacco: Never Used     Comment: Social smoker   . Alcohol use 0.6 oz/week    1 Shots of  liquor per week     Comment: socially    No Known Allergies  Current Outpatient Prescriptions  Medication Sig Dispense Refill  . aspirin EC 81 MG tablet Take 81 mg by mouth daily.    . carvedilol (COREG) 6.25 MG tablet Take 6.25 mg by mouth 2 (two) times daily with a meal.     . escitalopram (LEXAPRO) 10 MG tablet Take 10 mg by mouth daily.    Marland Kitchen lisinopril (PRINIVIL,ZESTRIL) 2.5 MG tablet Take 2.5 mg by mouth daily.     Marland Kitchen LORazepam (ATIVAN) 0.5 MG tablet Take 1 tablet (0.5 mg total) by mouth 2 (two) times daily as needed for anxiety. Do not take within 6 hours of the zolpidem; no alcohol 10 tablet 0  . MAGNESIUM PO Take 1 tablet by mouth daily.    . Multiple Vitamin (MULTIVITAMIN) tablet Take 1 tablet by mouth daily.    Marland Kitchen omeprazole (PRILOSEC) 20 MG capsule Take 20 mg by mouth daily.    . simvastatin (ZOCOR) 40 MG tablet Take 40 mg by mouth at bedtime.     . TURMERIC PO Take by mouth daily.    Marland Kitchen zolpidem (AMBIEN) 10 MG tablet Take 5 mg by mouth at bedtime as needed.      No current facility-administered medications for this visit.       Review of Systems A complete review of systems was asked and was negative except for the following positive findingsHeartburn, joint pain, anxiety.  Blood pressure (!) 178/74, pulse 83, temperature 98.3 F (36.8 C), temperature source Oral, resp. rate 14, height 5\' 2"  (1.575 m), weight 147 lb (66.7 kg), SpO2 99 %.  Physical Exam CONSTITUTIONAL:  Pleasant, well-developed, well-nourished, and in no acute distress. EYES: Pupils equal and reactive to light, Sclera non-icteric EARS, NOSE, MOUTH AND THROAT:  The oropharynx was clear.  Dentition is good repair.  Oral mucosa pink and moist. LYMPH NODES:  Lymph nodes in the neck and axillae were normal RESPIRATORY:  Lungs were clear.  Normal respiratory effort without pathologic use of accessory muscles of respiration CARDIOVASCULAR: Heart was regular without murmurs.  There were no carotid bruits. GI:  The abdomen was soft, nontender, and nondistended. There were no palpable masses. There was no hepatosplenomegaly. There were normal bowel sounds in all quadrants. GU:  Rectal deferred.   MUSCULOSKELETAL:  Normal muscle strength and tone.  No clubbing or cyanosis.   SKIN:  There were no pathologic skin lesions.  There were no nodules on palpation. NEUROLOGIC:  Sensation is normal.  Cranial nerves are grossly intact. PSYCH:  Oriented to person, place and time.  Mood and affect are normal.  Data Reviewed CT scan of the chest  I have  personally reviewed the patient's imaging, laboratory findings and medical records.    Assessment    I have independently reviewed the patient's CT scan the chest. Within the right lower lobe there is a solitary nodule. There is no associated mediastinal adenopathy and no other pulmonary nodules seen    Plan    I believe that this is most likely a benign process and I have recommended to her that we repeat the scan in about 6 months. She is agreeable to doing so. I do not believe any contrast at that time. We will arrange to see her back after the CT scan.  Thank you very much for your very kind referral       Nestor Lewandowsky, MD 11/15/2016, 10:36 AM

## 2016-11-15 NOTE — Patient Instructions (Signed)
We will call you to remind you in 6 months for your CT scan without and then a follow up appointment with Dr. Genevive Bi.

## 2017-02-21 DIAGNOSIS — E782 Mixed hyperlipidemia: Secondary | ICD-10-CM | POA: Diagnosis not present

## 2017-02-21 DIAGNOSIS — F411 Generalized anxiety disorder: Secondary | ICD-10-CM | POA: Diagnosis not present

## 2017-02-21 DIAGNOSIS — I5181 Takotsubo syndrome: Secondary | ICD-10-CM | POA: Diagnosis not present

## 2017-02-21 DIAGNOSIS — I2510 Atherosclerotic heart disease of native coronary artery without angina pectoris: Secondary | ICD-10-CM | POA: Diagnosis not present

## 2017-02-21 DIAGNOSIS — I10 Essential (primary) hypertension: Secondary | ICD-10-CM | POA: Diagnosis not present

## 2017-05-09 DIAGNOSIS — Z23 Encounter for immunization: Secondary | ICD-10-CM | POA: Diagnosis not present

## 2017-05-20 ENCOUNTER — Ambulatory Visit
Admission: RE | Admit: 2017-05-20 | Discharge: 2017-05-20 | Disposition: A | Discharge status: Home / Self Care | Payer: Medicare Other | Source: Ambulatory Visit | Attending: Cardiothoracic Surgery | Admitting: Cardiothoracic Surgery

## 2017-05-20 DIAGNOSIS — R918 Other nonspecific abnormal finding of lung field: Secondary | ICD-10-CM

## 2017-05-20 DIAGNOSIS — I700 Atherosclerosis of aorta: Secondary | ICD-10-CM | POA: Diagnosis not present

## 2017-05-20 DIAGNOSIS — I2510 Atherosclerotic heart disease of native coronary artery without angina pectoris: Secondary | ICD-10-CM | POA: Diagnosis not present

## 2017-05-30 ENCOUNTER — Ambulatory Visit (INDEPENDENT_AMBULATORY_CARE_PROVIDER_SITE_OTHER): Payer: Medicare Other | Admitting: Cardiothoracic Surgery

## 2017-05-30 ENCOUNTER — Encounter: Payer: Self-pay | Admitting: Cardiothoracic Surgery

## 2017-05-30 ENCOUNTER — Other Ambulatory Visit: Payer: Self-pay

## 2017-05-30 ENCOUNTER — Telehealth: Payer: Self-pay

## 2017-05-30 VITALS — BP 152/79 | HR 85 | Temp 97.7°F | Resp 14 | Ht 62.0 in | Wt 151.0 lb

## 2017-05-30 DIAGNOSIS — R911 Solitary pulmonary nodule: Secondary | ICD-10-CM | POA: Diagnosis not present

## 2017-05-30 DIAGNOSIS — I2510 Atherosclerotic heart disease of native coronary artery without angina pectoris: Secondary | ICD-10-CM

## 2017-05-30 NOTE — Patient Instructions (Signed)
We will contact you with your CT Scan appointment as well as an office visit for 6 months from now.  Please give our office a call if you have any questions or concerns.

## 2017-05-30 NOTE — Telephone Encounter (Signed)
Call made to central scheduling at this time. Spoke with Irine Seal and was able to get CT Scan of chest w/o contrast scheduled for 4/2 at [facility]. Will contact patient with appointment as well as office visit appointment.   Call made to patient at this time. Left a message for letting the patient know that her Ct Scan is scheduled for 11/11/17 at 8 AM and that we will follow up with for the result on 11/14/17 at [facility] to call us if she has any questions or concerns.

## 2017-05-30 NOTE — Progress Notes (Signed)
  Patient ID: Lydia Elliott, female   DOB: November 24, 1950, 66 y.o.   MRN: [id number]  HISTORY: Mr. Lydia Elliott returns today in follow-up. She's been anxious about the results of her CT scan. She has had no cough, fevers or chills. She denied any shortness of breath. She did have some recent diarrhea but attributes that to her anxiety.   Vitals:   05/30/17 0830  BP: (!) 152/79  Pulse: 85  Resp: 14  Temp: 97.7 F (36.5 C)  SpO2: 99%     EXAM:    Resp: Lungs are clear bilaterally.  No respiratory distress, normal effort. Heart:  Regular without murmurs Abd:  Abdomen is soft, non distended and non tender. No masses are palpable.  There is no rebound and no guarding.  Neurological: Alert and oriented to person, place, and time. Coordination normal.  Skin: Skin is warm and dry. No rash noted. No diaphoretic. No erythema. No pallor.  Psychiatric: Normal mood and affect. Normal behavior. Judgment and thought content normal.    ASSESSMENT: I have independently reviewed the patient's CT scan. There is been no change in the multiple bilateral pulmonary nodules.   PLAN:   I discussed with herthe results of the CT scan. I would like to bring her back again in 6 months time with another chest CT.    Nestor Lewandowsky, MD

## 2017-06-24 DIAGNOSIS — M9901 Segmental and somatic dysfunction of cervical region: Secondary | ICD-10-CM | POA: Diagnosis not present

## 2017-06-24 DIAGNOSIS — M62830 Muscle spasm of back: Secondary | ICD-10-CM | POA: Diagnosis not present

## 2017-06-24 DIAGNOSIS — M9902 Segmental and somatic dysfunction of thoracic region: Secondary | ICD-10-CM | POA: Diagnosis not present

## 2017-06-24 DIAGNOSIS — M5414 Radiculopathy, thoracic region: Secondary | ICD-10-CM | POA: Diagnosis not present

## 2017-07-01 DIAGNOSIS — M9901 Segmental and somatic dysfunction of cervical region: Secondary | ICD-10-CM | POA: Diagnosis not present

## 2017-07-01 DIAGNOSIS — M9902 Segmental and somatic dysfunction of thoracic region: Secondary | ICD-10-CM | POA: Diagnosis not present

## 2017-07-01 DIAGNOSIS — M62830 Muscle spasm of back: Secondary | ICD-10-CM | POA: Diagnosis not present

## 2017-07-01 DIAGNOSIS — M5414 Radiculopathy, thoracic region: Secondary | ICD-10-CM | POA: Diagnosis not present

## 2017-07-07 DIAGNOSIS — L900 Lichen sclerosus et atrophicus: Secondary | ICD-10-CM | POA: Diagnosis not present

## 2017-07-08 DIAGNOSIS — M170 Bilateral primary osteoarthritis of knee: Secondary | ICD-10-CM | POA: Diagnosis not present

## 2017-07-08 DIAGNOSIS — M25569 Pain in unspecified knee: Secondary | ICD-10-CM | POA: Diagnosis not present

## 2017-07-09 DIAGNOSIS — M62830 Muscle spasm of back: Secondary | ICD-10-CM | POA: Diagnosis not present

## 2017-07-09 DIAGNOSIS — M9901 Segmental and somatic dysfunction of cervical region: Secondary | ICD-10-CM | POA: Diagnosis not present

## 2017-07-09 DIAGNOSIS — M9902 Segmental and somatic dysfunction of thoracic region: Secondary | ICD-10-CM | POA: Diagnosis not present

## 2017-07-09 DIAGNOSIS — M5414 Radiculopathy, thoracic region: Secondary | ICD-10-CM | POA: Diagnosis not present

## 2017-07-11 DIAGNOSIS — M62830 Muscle spasm of back: Secondary | ICD-10-CM | POA: Diagnosis not present

## 2017-07-11 DIAGNOSIS — M5414 Radiculopathy, thoracic region: Secondary | ICD-10-CM | POA: Diagnosis not present

## 2017-07-11 DIAGNOSIS — M9902 Segmental and somatic dysfunction of thoracic region: Secondary | ICD-10-CM | POA: Diagnosis not present

## 2017-07-11 DIAGNOSIS — M9901 Segmental and somatic dysfunction of cervical region: Secondary | ICD-10-CM | POA: Diagnosis not present

## 2017-07-29 DIAGNOSIS — M9902 Segmental and somatic dysfunction of thoracic region: Secondary | ICD-10-CM | POA: Diagnosis not present

## 2017-07-29 DIAGNOSIS — M9901 Segmental and somatic dysfunction of cervical region: Secondary | ICD-10-CM | POA: Diagnosis not present

## 2017-07-29 DIAGNOSIS — M5414 Radiculopathy, thoracic region: Secondary | ICD-10-CM | POA: Diagnosis not present

## 2017-07-29 DIAGNOSIS — M62830 Muscle spasm of back: Secondary | ICD-10-CM | POA: Diagnosis not present

## 2017-08-19 ENCOUNTER — Encounter: Payer: Medicare Other | Admitting: Family Medicine

## 2017-08-25 ENCOUNTER — Ambulatory Visit (INDEPENDENT_AMBULATORY_CARE_PROVIDER_SITE_OTHER): Payer: Medicare Other | Admitting: Family Medicine

## 2017-08-25 ENCOUNTER — Encounter: Payer: Self-pay | Admitting: Family Medicine

## 2017-08-25 VITALS — BP 152/74 | HR 58 | Temp 98.3°F | Ht 62.0 in | Wt 147.6 lb

## 2017-08-25 DIAGNOSIS — E782 Mixed hyperlipidemia: Secondary | ICD-10-CM | POA: Diagnosis not present

## 2017-08-25 DIAGNOSIS — Z780 Asymptomatic menopausal state: Secondary | ICD-10-CM | POA: Diagnosis not present

## 2017-08-25 DIAGNOSIS — I2510 Atherosclerotic heart disease of native coronary artery without angina pectoris: Secondary | ICD-10-CM | POA: Diagnosis not present

## 2017-08-25 DIAGNOSIS — R7301 Impaired fasting glucose: Secondary | ICD-10-CM | POA: Diagnosis not present

## 2017-08-25 DIAGNOSIS — D6489 Other specified anemias: Secondary | ICD-10-CM | POA: Diagnosis not present

## 2017-08-25 DIAGNOSIS — Z5181 Encounter for therapeutic drug level monitoring: Secondary | ICD-10-CM

## 2017-08-25 DIAGNOSIS — Z0001 Encounter for general adult medical examination with abnormal findings: Secondary | ICD-10-CM | POA: Diagnosis not present

## 2017-08-25 DIAGNOSIS — Z0000 Encounter for general adult medical examination without abnormal findings: Secondary | ICD-10-CM

## 2017-08-25 DIAGNOSIS — I700 Atherosclerosis of aorta: Secondary | ICD-10-CM | POA: Diagnosis not present

## 2017-08-25 NOTE — Progress Notes (Signed)
Patient: Lydia Elliott, Female    DOB: August 25, 1950, 67 y.o.   MRN: [id number]  Visit Date: 08/29/2017  Today's Provider: Enid Derry, MD   Chief Complaint  Patient presents with  . Annual Exam    pt c/o burning in her hands and feet     Subjective:   Lydia Elliott is a 67 y.o. female who presents today for her Subsequent Annual Wellness Visit.  She has some burning in her palms, sometimes, bottom of feet Taking vit B12 She has CAD and is followed by cardiologist; he has recently adjusted her BP medicine, he manages She has thalassemia and anemia, will always have small pale cells High cholesterol; on statin Prediabetes in her problem list; due for that to be checked as well She atherosclerosis in her aorta on imaging; on aspirin and statin  USPSTF grade A and B recommendations Depression:  Depression screen [facility] 2/9 08/25/2017 10/18/2016 04/22/2016 11/27/2015  Decreased Interest 0 0 1 0  Down, Depressed, Hopeless 0 0 1 0  PHQ - 2 Score 0 0 2 0  Altered sleeping - - 0 -  Tired, decreased energy - - 0 -  Change in appetite - - 0 -  Feeling bad or failure about yourself  - - 3 -  Trouble concentrating - - 0 -  Moving slowly or fidgety/restless - - 0 -  Suicidal thoughts - - 0 -  PHQ-9 Score - - 5 -  Difficult doing work/chores - - Not difficult at all -   Hypertension: managed by cardiologist, adjusted dose recently; was on 12.5 mg BID BP Readings from Last 3 Encounters:  08/25/17 (!) 152/74  05/30/17 (!) 152/79  11/15/16 (!) 178/74   Obesity: Wt Readings from Last 3 Encounters:  08/25/17 147 lb 9.6 oz (67 kg)  05/30/17 151 lb (68.5 kg)  11/15/16 147 lb (66.7 kg)   BMI Readings from Last 3 Encounters:  08/25/17 27.00 kg/m  05/30/17 27.62 kg/m  11/15/16 26.89 kg/m    Skin cancer: no worrisome moles; saw dermatologist recently, lichen sclerosis Lung cancer:  Dr. Genevive Bi keeps an eye on her Chest CT May 20, 2017 IMPRESSION: 1. Interval stability of scattered solid  pulmonary nodules, largest 8 mm in the subpleural right lower lobe, for which 7 month stability has been demonstrated, probably benign. Non-contrast chest CT in 11-17 months is considered optional for low-risk patients, but is recommended for high-risk patients. This recommendation follows the consensus statement: Guidelines for Management of Incidental Pulmonary Nodules Detected on CT Images: From the Fleischner Society 2017; Radiology 2017; 284:228-243. 2. Left main and 1 vessel coronary atherosclerosis. 3. 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  Aortic Atherosclerosis (ICD10-I70.0).   Electronically Signed   By: Ilona Sorrel M.D.   On: 05/20/2017 09:40 Breast cancer: can't do a mammogram, mental block, worried, does not want, counseling, may call for order Colorectal cancer: had one in 2017; next due 2027 Cervical cancer screening: n/a HIV, hep B, hep C: already had hep C STD testing and prevention (chl/gon/syphilis): not interested  Intimate partner violence: no abuse Contraception: n/a Osteoporosis: DEXA ordered Fall prevention/vitamin D: discussed Diet: started to eat differently last year; 162 pounds when she moved her; maintained under 150 pounds for a year and a half; cut down on red meat; eats healthier; taking B12 for fatigue; taking natural supplement, JuicePlus; not doing keto Exercise: gets plenty of movement, "neurotic cleaner", joined a gym; she will go with a friend  to show her to use machines; start slowly and build up gradually Alcohol: 1-2 drinks per week, feels it too quickly Tobacco use: quit remotely Aspirin: daily Lipids: fasting Lab Results  Component Value Date   CHOL 163 08/25/2017   CHOL 166 10/18/2016   CHOL 154 05/10/2016   Lab Results  Component Value Date   HDL 72 08/25/2017   HDL 76 10/18/2016   HDL 56 05/10/2016   Lab Results  Component Value Date   LDLCALC 78 10/18/2016    LDLCALC 82 05/10/2016   LDLCALC 70 06/20/2015   Lab Results  Component Value Date   TRIG 69 08/25/2017   TRIG 59 10/18/2016   TRIG 79 05/10/2016   Lab Results  Component Value Date   CHOLHDL 2.3 08/25/2017   CHOLHDL 2.2 10/18/2016   CHOLHDL 2.8 05/10/2016   No results found for: LDLDIRECT Glucose: fasting today Glucose, Bld  Date Value Ref Range Status  08/25/2017 96 65 - 99 mg/dL Final    Comment:    .            Fasting reference interval .   10/15/2016 107 (H) 65 - 99 mg/dL Final  05/10/2016 125 (H) 65 - 99 mg/dL Final   AAA: no fam hx  Review of Systems  Past Medical History:  Diagnosis Date  . Arthritis    hands, knees, toes  . CAD (coronary artery disease)   . Chronic anemia   . GERD (gastroesophageal reflux disease)   . Heart disease    has a stent  . Hyperlipidemia   . Hypertension   . Insomnia   . Microcytosis    Hgb 10.9, MCV 66 April 2015  . Nodule of right lung 10/18/2016  . Overweight (BMI 25.0-29.9) 12/23/2015  . Polyarthralgia   . Thalassemia   . Vertigo    none - 10 yrs  . Wears dentures    partial lower    Past Surgical History:  Procedure Laterality Date  . APPENDECTOMY  1967  . CARPAL TUNNEL RELEASE  2007  . CATARACT EXTRACTION  2013  . CHOLECYSTECTOMY  2000  . COLONOSCOPY  7/31//15   Dr.Wohl  . COLONOSCOPY WITH PROPOFOL N/A 12/11/2015   Procedure: COLONOSCOPY WITH PROPOFOL;  Surgeon: Lucilla Lame, MD;  Location: [place];  Service: Endoscopy;  Laterality: N/A;  DESCENDING COLON POLYP   . CORONARY ANGIOPLASTY WITH STENT PLACEMENT  2010  . ESOPHAGOGASTRODUODENOSCOPY  02/21/14   Dr.Wohl  . ESOPHAGOGASTRODUODENOSCOPY (EGD) WITH PROPOFOL N/A 12/11/2015   Procedure: ESOPHAGOGASTRODUODENOSCOPY (EGD) WITH PROPOFOL;  Surgeon: Lucilla Lame, MD;  Location: [place];  Service: Endoscopy;  Laterality: N/A;  . POLYPECTOMY N/A 12/11/2015   Procedure: POLYPECTOMY;  Surgeon: Lucilla Lame, MD;  Location: [place];   Service: Endoscopy;  Laterality: N/A;  . TONSILLECTOMY AND ADENOIDECTOMY  1969    Family History  Problem Relation Age of Onset  . Cirrhosis Mother   . Hepatitis Mother   . Thalassemia Mother   . Diabetes Mother   . Heart disease Father   . Diabetes Father   . Hyperlipidemia Father   . Hypertension Father   . Stroke Father   . Cancer Sister        lung  . Diabetes Sister   . Stroke Sister        during a surgery  . Diabetes Brother   . Heart disease Brother   . Hypertension Brother   . Stroke Brother   . Thalassemia Maternal Grandmother   .  COPD Neg Hx     Social History   Tobacco Use  . Smoking status: Former Smoker    Types: Cigarettes  . Smokeless tobacco: Never Used  . Tobacco comment: Social smoker   Substance Use Topics  . Alcohol use: Yes    Alcohol/week: 0.6 oz    Types: 1 Shots of liquor per week    Comment: socially  . Drug use: No    Outpatient Encounter Medications as of 08/25/2017  Medication Sig Note  . aspirin EC 81 MG tablet Take 81 mg by mouth daily.   Marland Kitchen desoximetasone (TOPICORT) 0.25 % cream 1(ONE) APPLICATION(S) TOPICAL 2(TWO) TIMES A DAY X 2 WEEKS UNTIL SYMPTOM IMPROVE   . escitalopram (LEXAPRO) 10 MG tablet Take 10 mg by mouth daily.   Marland Kitchen lisinopril (PRINIVIL,ZESTRIL) 2.5 MG tablet Take 2.5 mg by mouth daily.    . Multiple Vitamin (MULTIVITAMIN) tablet Take 1 tablet by mouth daily.   Marland Kitchen omeprazole (PRILOSEC) 20 MG capsule Take 20 mg by mouth daily. 10/18/2016: PRN  . simvastatin (ZOCOR) 40 MG tablet Take 40 mg by mouth at bedtime.    . TURMERIC PO Take by mouth daily.   Marland Kitchen zolpidem (AMBIEN) 10 MG tablet Take 5 mg by mouth at bedtime as needed.    . carvedilol (COREG) 6.25 MG tablet Take 6.25 mg by mouth 2 (two) times daily with a meal.     No facility-administered encounter medications on file as of 08/25/2017.     Functional Ability / Safety Screening 1.  Was the timed Get Up and Go test less than 12 seconds?  yes  Two falls last year, does  get on ladders; on back porch in flip flops; now in tennis shoes, no flip flops 2.  Does the patient need help with the phone, transportation, shopping,      preparing meals, housework, laundry, medications, or managing money?  no 3.  Does the patient's home have:  loose throw rugs in the hallway?   no      Grab bars in the bathroom? yes just bought them and having them installed      Handrails on the stairs?   yes      Good lighting?   yes, motion sensing 4.  Has the patient noticed any hearing difficulties?   yes, a little, but it's her husband's voice  Advanced Directives Does patient have a HCPOA?    no If yes, name and contact information:  Does patient have a living will or MOST form?  no  Immunizations: due for PPSV-23 in Feb  Fall Risk Assessment See under rooming  Depression Screen Terrible anxiety; I had recommended several people; she says she was waiting for 10 weeks, at 8 weeks they called and said no new patients See under rooming Depression screen [facility] 2/9 08/25/2017 10/18/2016 04/22/2016 11/27/2015  Decreased Interest 0 0 1 0  Down, Depressed, Hopeless 0 0 1 0  PHQ - 2 Score 0 0 2 0  Altered sleeping - - 0 -  Tired, decreased energy - - 0 -  Change in appetite - - 0 -  Feeling bad or failure about yourself  - - 3 -  Trouble concentrating - - 0 -  Moving slowly or fidgety/restless - - 0 -  Suicidal thoughts - - 0 -  PHQ-9 Score - - 5 -  Difficult doing work/chores - - Not difficult at all -    Objective:   Vitals: BP (!) 152/74 (BP Location:  Left Arm, Patient Position: Sitting, Cuff Size: Normal)   Pulse (!) 58   Temp 98.3 F (36.8 C) (Oral)   Ht 5\' 2"  (1.575 m)   Wt 147 lb 9.6 oz (67 kg)   SpO2 97%   BMI 27.00 kg/m  Body mass index is 27 kg/m. No exam data present  Physical Exam Mood/affect:  euthymic Appearance:  Neat, good hygiene  6CIT Screen 08/25/2017  What Year? 0 points  What month? 0 points  What time? 0 points  Count back from 20 0 points   Months in reverse 0 points  Repeat phrase 2 points  Total Score 2    Assessment & Plan:     Annual Wellness Visit  Reviewed patient's Family Medical History Reviewed and updated list of patient's medical providers Assessment of cognitive impairment was done Assessed patient's functional ability Established a written schedule for health screening [place] Completed and Reviewed  Exercise Activities and Dietary recommendations Goals    . Increase physical activity (pt-stated)       Immunization History  Administered Date(s) Administered  . Influenza, High Dose Seasonal PF 04/22/2016  . Influenza-Unspecified 06/02/2015, 05/12/2017  . Pneumococcal Conjugate-13 09/18/2016  . Tdap 02/08/2014    Health Maintenance  Topic Date Due  . MAMMOGRAM  12/23/1967  . DEXA SCAN  12/23/2014  . TETANUS/TDAP  02/09/2024  . COLONOSCOPY  12/10/2025  . INFLUENZA VACCINE  Completed  . Hepatitis C Screening  Completed  . PNA vac Low Risk Adult  Completed    Discussed health benefits of physical activity, and encouraged her to engage in regular exercise appropriate for her age and condition.   No orders of the defined types were placed in this encounter.   Current Outpatient Medications:  .  aspirin EC 81 MG tablet, Take 81 mg by mouth daily., Disp: , Rfl:  .  desoximetasone (TOPICORT) 0.25 % cream, 1(ONE) APPLICATION(S) TOPICAL 2(TWO) TIMES A DAY X 2 WEEKS UNTIL SYMPTOM IMPROVE, Disp: , Rfl: 0 .  escitalopram (LEXAPRO) 10 MG tablet, Take 10 mg by mouth daily., Disp: , Rfl:  .  lisinopril (PRINIVIL,ZESTRIL) 2.5 MG tablet, Take 2.5 mg by mouth daily. , Disp: , Rfl:  .  Multiple Vitamin (MULTIVITAMIN) tablet, Take 1 tablet by mouth daily., Disp: , Rfl:  .  omeprazole (PRILOSEC) 20 MG capsule, Take 20 mg by mouth daily., Disp: , Rfl:  .  simvastatin (ZOCOR) 40 MG tablet, Take 40 mg by mouth at bedtime. , Disp: , Rfl:  .  TURMERIC PO, Take by mouth daily., Disp: , Rfl:   .  zolpidem (AMBIEN) 10 MG tablet, Take 5 mg by mouth at bedtime as needed. , Disp: , Rfl:  .  carvedilol (COREG) 6.25 MG tablet, Take 6.25 mg by mouth 2 (two) times daily with a meal. , Disp: , Rfl:  There are no discontinued medications.  Next Medicare Wellness Visit in 12+ months  Problem List Items Addressed This Visit      Cardiovascular and Mediastinum   CAD (coronary artery disease)    Managed by Dr. Ubaldo Glassing; daily aspirin; LDL goal is less than 9; taking statin; continue carvedilol      Aortic atherosclerosis (HCC)    Goal LDL less than 70; on aspirin and statin        Endocrine   Impaired fasting glucose    Check glucose A1c      Relevant Orders   Hemoglobin A1c (Completed)     Other  Medication monitoring encounter    Check liver and kidneys      Relevant Orders   Comprehensive metabolic panel (Completed)   Hyperlipidemia    Check lipids      Relevant Orders   Lipid panel (Completed)   Anemia    Check CBC      Relevant Orders   CBC with Differential/Platelet (Completed)   Medicare annual wellness visit, subsequent - Primary    USPSTF grade A and B recommendations reviewed with patient; age-appropriate recommendations, preventive care, screening tests, etc discussed and encouraged; healthy living encouraged; see AVS for patient education given to patient        Other Visit Diagnoses    Postmenopausal       Relevant Orders   DG Bone Density

## 2017-08-25 NOTE — Assessment & Plan Note (Signed)
Check lipids 

## 2017-08-25 NOTE — Assessment & Plan Note (Signed)
Check liver and kidneys 

## 2017-08-25 NOTE — Assessment & Plan Note (Signed)
Managed by Dr. Ubaldo Glassing; daily aspirin; LDL goal is less than 70; taking statin; continue carvedilol

## 2017-08-25 NOTE — Assessment & Plan Note (Signed)
Check glucose A1c

## 2017-08-25 NOTE — Assessment & Plan Note (Signed)
Check CBC 

## 2017-08-25 NOTE — Patient Instructions (Addendum)
I recommend the chest CT follow-up per the radiologist's guidelines, which will come due in September 2019; contact Dr. Genevive Bi for the order Please do call Dr. Ubaldo Glassing about increasing your carvedilol or lisinopril or adding another medicine Your heart rate was 58 today, blood pressure was 152/74  Take 1000 iu vitamin D3 daily on dark cold days, not if outdoors Check with your pharmacy in February about getting the PPSV-23 (Pneumovax) You can also ask them for a Shingrix vaccine, that is a two-part vaccine Health Maintenance  Topic Date Due  . MAMMOGRAM  12/23/1967  . DEXA SCAN  12/23/2014  . PNA vac Low Risk Adult (2 of 2 - PPSV23) 09/25/2016  . TETANUS/TDAP  02/09/2024  . COLONOSCOPY  12/10/2025  . INFLUENZA VACCINE  Completed  . Hepatitis C Screening  Completed  Please do call to schedule your bone density study; the number to schedule one at either [facility] or [facility] is [phone] or [phone] Think about getting a mammogram Take vitamin B12 sublingual 250 or 500 mcg daily    Steps to Elicit the Relaxation Response The following is the technique reprinted with permission from Dr. Billie Ruddy book The Relaxation Response pages 162-163 1. Sit quietly in a comfortable position. 2. Close your eyes. 3. Deeply relax all your muscles,  beginning at your feet and progressing up to your face.  Keep them relaxed. 4. Breathe through your nose.  Become aware of your breathing.  As you breathe out, say the word, "one"*,  silently to yourself. For example,  breathe in ... out, "one",- in .. out, "one", etc.  Breathe easily and naturally. 5. Continue for 10 to 20 minutes.  You may open your eyes to check the time, but do not use an alarm.  When you finish, sit quietly for several minutes,  at first with your eyes closed and later with your eyes opened.  Do not stand up for a few minutes. 6. Do not worry about whether you are successful  in  achieving a deep level of relaxation.  Maintain a passive attitude and permit relaxation to occur at its own pace.  When distracting thoughts occur,  try to ignore them by not dwelling upon them  and return to repeating "one."  With practice, the response should come with little effort.  Practice the technique once or twice daily,  but not within two hours after any meal,  since the digestive processes seem to interfere with  the elicitation of the Relaxation Response. * It is better to use a soothing, mellifluous sound, preferably with no meaning. or association, to avoid stimulation of unnecessary thoughts - a mantra.    DASH Eating Plan DASH stands for "Dietary Approaches to Stop Hypertension." The DASH eating plan is a healthy eating plan that has been shown to reduce high blood pressure (hypertension). It may also reduce your risk for type 2 diabetes, heart disease, and stroke. The DASH eating plan may also help with weight loss. What are tips for following this plan? General guidelines  Avoid eating more than 2,300 mg (milligrams) of salt (sodium) a day. If you have hypertension, you may need to reduce your sodium intake to 1,500 mg a day.  Limit alcohol intake to no more than 1 drink a day for nonpregnant women and 2 drinks a day for men. One drink equals 12 oz of beer, 5 oz of wine, or 1 oz of hard liquor.  Work with your health  care provider to maintain a healthy body weight or to lose weight. Ask what an ideal weight is for you.  Get at least 30 minutes of exercise that causes your heart to beat faster (aerobic exercise) most days of the week. Activities may include walking, swimming, or biking.  Work with your health care provider or diet and nutrition specialist (dietitian) to adjust your eating plan to your individual calorie needs. Reading food labels  Check food labels for the amount of sodium per serving. Choose foods with less than 5 percent of the Daily Value of  sodium. Generally, foods with less than 300 mg of sodium per serving fit into this eating plan.  To find whole grains, look for the word "whole" as the first word in the ingredient list. Shopping  Buy products labeled as "low-sodium" or "no salt added."  Buy fresh foods. Avoid canned foods and premade or frozen meals. Cooking  Avoid adding salt when cooking. Use salt-free seasonings or herbs instead of table salt or sea salt. Check with your health care provider or pharmacist before using salt substitutes.  Do not fry foods. Cook foods using healthy methods such as baking, boiling, grilling, and broiling instead.  Cook with heart-healthy oils, such as olive, canola, soybean, or sunflower oil. Meal planning   Eat a balanced diet that includes: ? 5 or more servings of fruits and vegetables each day. At each meal, try to fill half of your plate with fruits and vegetables. ? Up to 6-8 servings of whole grains each day. ? Less than 6 oz of lean meat, poultry, or fish each day. A 3-oz serving of meat is about the same size as a deck of cards. One egg equals 1 oz. ? 2 servings of low-fat dairy each day. ? A serving of nuts, seeds, or beans 5 times each week. ? Heart-healthy fats. Healthy fats called Omega-3 fatty acids are found in foods such as flaxseeds and coldwater fish, like sardines, salmon, and mackerel.  Limit how much you eat of the following: ? Canned or prepackaged foods. ? Food that is high in trans fat, such as fried foods. ? Food that is high in saturated fat, such as fatty meat. ? Sweets, desserts, sugary drinks, and other foods with added sugar. ? Full-fat dairy products.  Do not salt foods before eating.  Try to eat at least 2 vegetarian meals each week.  Eat more home-cooked food and less restaurant, buffet, and fast food.  When eating at a restaurant, ask that your food be prepared with less salt or no salt, if possible. What foods are recommended? The items  listed may not be a complete list. Talk with your dietitian about what dietary choices are best for you. Grains Whole-grain or whole-wheat bread. Whole-grain or whole-wheat pasta. Brown rice. [place]. Bulgur. Whole-grain and low-sodium cereals. Pita bread. Low-fat, low-sodium crackers. Whole-wheat flour tortillas. Vegetables Fresh or frozen vegetables (raw, steamed, roasted, or grilled). Low-sodium or reduced-sodium tomato and vegetable juice. Low-sodium or reduced-sodium tomato sauce and tomato paste. Low-sodium or reduced-sodium canned vegetables. Fruits All fresh, dried, or frozen fruit. Canned fruit in natural juice (without added sugar). Meat and other protein foods Skinless chicken or [place]. Ground chicken or [place]. Pork with fat trimmed off. Fish and seafood. Egg whites. Dried beans, peas, or lentils. Unsalted nuts, nut butters, and seeds. Unsalted canned beans. Lean cuts of beef with fat trimmed off. Low-sodium, lean deli meat. Dairy Low-fat (1%) or fat-free (skim) milk. Fat-free, low-fat, or  reduced-fat cheeses. Nonfat, low-sodium ricotta or cottage cheese. Low-fat or nonfat yogurt. Low-fat, low-sodium cheese. Fats and oils Soft margarine without trans fats. Vegetable oil. Low-fat, reduced-fat, or light mayonnaise and salad dressings (reduced-sodium). Canola, safflower, olive, soybean, and sunflower oils. Avocado. Seasoning and other foods Herbs. Spices. Seasoning mixes without salt. Unsalted popcorn and pretzels. Fat-free sweets. What foods are not recommended? The items listed may not be a complete list. Talk with your dietitian about what dietary choices are best for you. Grains Baked goods made with fat, such as croissants, muffins, or some breads. Dry pasta or rice meal packs. Vegetables Creamed or fried vegetables. Vegetables in a cheese sauce. Regular canned vegetables (not low-sodium or reduced-sodium). Regular canned tomato sauce and paste (not low-sodium or  reduced-sodium). Regular tomato and vegetable juice (not low-sodium or reduced-sodium). Angie Fava. Olives. Fruits Canned fruit in a light or heavy syrup. Fried fruit. Fruit in cream or butter sauce. Meat and other protein foods Fatty cuts of meat. Ribs. Fried meat. Berniece Salines. Sausage. Bologna and other processed lunch meats. Salami. Fatback. Hotdogs. Bratwurst. Salted nuts and seeds. Canned beans with added salt. Canned or smoked fish. Whole eggs or egg yolks. Chicken or [place] with skin. Dairy Whole or 2% milk, cream, and half-and-half. Whole or full-fat cream cheese. Whole-fat or sweetened yogurt. Full-fat cheese. Nondairy creamers. Whipped toppings. Processed cheese and cheese spreads. Fats and oils Butter. Stick margarine. Lard. Shortening. Ghee. Bacon fat. Tropical oils, such as coconut, palm kernel, or palm oil. Seasoning and other foods Salted popcorn and pretzels. Onion salt, garlic salt, seasoned salt, table salt, and sea salt. Worcestershire sauce. Tartar sauce. Barbecue sauce. Teriyaki sauce. Soy sauce, including reduced-sodium. Steak sauce. Canned and packaged gravies. Fish sauce. Oyster sauce. Cocktail sauce. Horseradish that you find on the shelf. Ketchup. Mustard. Meat flavorings and tenderizers. Bouillon cubes. Hot sauce and Tabasco sauce. Premade or packaged marinades. Premade or packaged taco seasonings. Relishes. Regular salad dressings. Where to find more information:  National Heart, Lung, and [place]: https://wilson-eaton.com/  American Heart Association: www.heart.org Summary  The DASH eating plan is a healthy eating plan that has been shown to reduce high blood pressure (hypertension). It may also reduce your risk for type 2 diabetes, heart disease, and stroke.  With the DASH eating plan, you should limit salt (sodium) intake to 2,300 mg a day. If you have hypertension, you may need to reduce your sodium intake to 1,500 mg a day.  When on the DASH eating plan, aim to eat more  fresh fruits and vegetables, whole grains, lean proteins, low-fat dairy, and heart-healthy fats.  Work with your health care provider or diet and nutrition specialist (dietitian) to adjust your eating plan to your individual calorie needs. This information is not intended to replace advice given to you by your health care provider. Make sure you discuss any questions you have with your health care provider. Document Released: 07/18/2011 Document Revised: 07/22/2016 Document Reviewed: 07/22/2016 Elsevier Interactive Patient Education  2018 [place] Maintenance, Female Adopting a healthy lifestyle and getting preventive care can go a long way to promote health and wellness. Talk with your health care provider about what schedule of regular examinations is right for you. This is a good chance for you to check in with your provider about disease prevention and staying healthy. In between checkups, there are plenty of things you can do on your own. Experts have done a lot of research about which lifestyle changes and preventive measures are most likely to  keep you healthy. Ask your health care provider for more information. Weight and diet Eat a healthy diet  Be sure to include plenty of vegetables, fruits, low-fat dairy products, and lean protein.  Do not eat a lot of foods high in solid fats, added sugars, or salt.  Get regular exercise. This is one of the most important things you can do for your health. ? Most adults should exercise for at least 150 minutes each week. The exercise should increase your heart rate and make you sweat (moderate-intensity exercise). ? Most adults should also do strengthening exercises at least twice a week. This is in addition to the moderate-intensity exercise.  Maintain a healthy weight  Body mass index (BMI) is a measurement that can be used to identify possible weight problems. It estimates body fat based on height and weight. Your health care  provider can help determine your BMI and help you achieve or maintain a healthy weight.  For females 58 years of age and older: ? A BMI below 18.5 is considered underweight. ? A BMI of 18.5 to 24.9 is normal. ? A BMI of 25 to 29.9 is considered overweight. ? A BMI of 30 and above is considered obese.  Watch levels of cholesterol and blood lipids  You should start having your blood tested for lipids and cholesterol at 68 years of age, then have this test every 5 years.  You may need to have your cholesterol levels checked more often if: ? Your lipid or cholesterol levels are high. ? You are older than 68 years of age. ? You are at high risk for heart disease.  Cancer screening Lung Cancer  Lung cancer screening is recommended for adults 48-36 years old who are at high risk for lung cancer because of a history of smoking.  A yearly low-dose CT scan of the lungs is recommended for people who: ? Currently smoke. ? Have quit within the past 15 years. ? Have at least a 30-pack-year history of smoking. A pack year is smoking an average of one pack of cigarettes a day for 1 year.  Yearly screening should continue until it has been 15 years since you quit.  Yearly screening should stop if you develop a health problem that would prevent you from having lung cancer treatment.  Breast Cancer  Practice breast self-awareness. This means understanding how your breasts normally appear and feel.  It also means doing regular breast self-exams. Let your health care provider know about any changes, no matter how small.  If you are in your 20s or 30s, you should have a clinical breast exam (CBE) by a health care provider every 1-3 years as part of a regular health exam.  If you are 107 or older, have a CBE every year. Also consider having a breast X-ray (mammogram) every year.  If you have a family history of breast cancer, talk to your health care provider about genetic screening.  If you are  at high risk for breast cancer, talk to your health care provider about having an MRI and a mammogram every year.  Breast cancer gene (BRCA) assessment is recommended for women who have family members with BRCA-related cancers. BRCA-related cancers include: ? Breast. ? Ovarian. ? Tubal. ? Peritoneal cancers.  Results of the assessment will determine the need for genetic counseling and BRCA1 and BRCA2 testing.  Cervical Cancer Your health care provider may recommend that you be screened regularly for cancer of the pelvic organs (ovaries, uterus, and  vagina). This screening involves a pelvic examination, including checking for microscopic changes to the surface of your cervix (Pap test). You may be encouraged to have this screening done every 3 years, beginning at age 19.  For women ages 94-65, health care providers may recommend pelvic exams and Pap testing every 3 years, or they may recommend the Pap and pelvic exam, combined with testing for human papilloma virus (HPV), every 5 years. Some types of HPV increase your risk of cervical cancer. Testing for HPV may also be done on women of any age with unclear Pap test results.  Other health care providers may not recommend any screening for nonpregnant women who are considered low risk for pelvic cancer and who do not have symptoms. Ask your health care provider if a screening pelvic exam is right for you.  If you have had past treatment for cervical cancer or a condition that could lead to cancer, you need Pap tests and screening for cancer for at least 20 years after your treatment. If Pap tests have been discontinued, your risk factors (such as having a new sexual partner) need to be reassessed to determine if screening should resume. Some women have medical problems that increase the chance of getting cervical cancer. In these cases, your health care provider may recommend more frequent screening and Pap tests.  Colorectal Cancer  This type of  cancer can be detected and often prevented.  Routine colorectal cancer screening usually begins at 68 years of age and continues through 68 years of age.  Your health care provider may recommend screening at an earlier age if you have risk factors for colon cancer.  Your health care provider may also recommend using home test kits to check for hidden blood in the stool.  A small camera at the end of a tube can be used to examine your colon directly (sigmoidoscopy or colonoscopy). This is done to check for the earliest forms of colorectal cancer.  Routine screening usually begins at age 4.  Direct examination of the colon should be repeated every 5-10 years through 68 years of age. However, you may need to be screened more often if early forms of precancerous polyps or small growths are found.  Skin Cancer  Check your skin from head to toe regularly.  Tell your health care provider about any new moles or changes in moles, especially if there is a change in a mole's shape or color.  Also tell your health care provider if you have a mole that is larger than the size of a pencil eraser.  Always use sunscreen. Apply sunscreen liberally and repeatedly throughout the day.  Protect yourself by wearing long sleeves, pants, a wide-brimmed hat, and sunglasses whenever you are outside.  Heart disease, diabetes, and high blood pressure  High blood pressure causes heart disease and increases the risk of stroke. High blood pressure is more likely to develop in: ? People who have blood pressure in the high end of the normal range (130-139/85-89 mm Hg). ? People who are overweight or obese. ? People who are African American.  If you are 63-30 years of age, have your blood pressure checked every 3-5 years. If you are 48 years of age or older, have your blood pressure checked every year. You should have your blood pressure measured twice-once when you are at a hospital or clinic, and once when you are  not at a hospital or clinic. Record the average of the two measurements. To check  your blood pressure when you are not at a hospital or clinic, you can use: ? An automated blood pressure machine at a pharmacy. ? A home blood pressure monitor.  If you are between 27 years and 82 years old, ask your health care provider if you should take aspirin to prevent strokes.  Have regular diabetes screenings. This involves taking a blood sample to check your fasting blood sugar level. ? If you are at a normal weight and have a low risk for diabetes, have this test once every three years after 68 years of age. ? If you are overweight and have a high risk for diabetes, consider being tested at a younger age or more often. Preventing infection Hepatitis B  If you have a higher risk for hepatitis B, you should be screened for this virus. You are considered at high risk for hepatitis B if: ? You were born in a country where hepatitis B is common. Ask your health care provider which countries are considered high risk. ? Your parents were born in a high-risk country, and you have not been immunized against hepatitis B (hepatitis B vaccine). ? You have HIV or AIDS. ? You use needles to inject street drugs. ? You live with someone who has hepatitis B. ? You have had sex with someone who has hepatitis B. ? You get hemodialysis treatment. ? You take certain medicines for conditions, including cancer, organ transplantation, and autoimmune conditions.  Hepatitis C  Blood testing is recommended for: ? Everyone born from [birth year] through 1966. ? Anyone with known risk factors for hepatitis C.  Sexually transmitted infections (STIs)  You should be screened for sexually transmitted infections (STIs) including gonorrhea and chlamydia if: ? You are sexually active and are younger than 68 years of age. ? You are older than 68 years of age and your health care provider tells you that you are at risk for this type of  infection. ? Your sexual activity has changed since you were last screened and you are at an increased risk for chlamydia or gonorrhea. Ask your health care provider if you are at risk.  If you do not have HIV, but are at risk, it may be recommended that you take a prescription medicine daily to prevent HIV infection. This is called pre-exposure prophylaxis (PrEP). You are considered at risk if: ? You are sexually active and do not regularly use condoms or know the HIV status of your partner(s). ? You take drugs by injection. ? You are sexually active with a partner who has HIV.  Talk with your health care provider about whether you are at high risk of being infected with HIV. If you choose to begin PrEP, you should first be tested for HIV. You should then be tested every 3 months for as long as you are taking PrEP. Pregnancy  If you are premenopausal and you may become pregnant, ask your health care provider about preconception counseling.  If you may become pregnant, take 400 to 800 micrograms (mcg) of folic acid every day.  If you want to prevent pregnancy, talk to your health care provider about birth control (contraception). Osteoporosis and menopause  Osteoporosis is a disease in which the bones lose minerals and strength with aging. This can result in serious bone fractures. Your risk for osteoporosis can be identified using a bone density scan.  If you are 65 years of age or older, or if you are at risk for osteoporosis and fractures, ask your  health care provider if you should be screened.  Ask your health care provider whether you should take a calcium or vitamin D supplement to lower your risk for osteoporosis.  Menopause may have certain physical symptoms and risks.  Hormone replacement therapy may reduce some of these symptoms and risks. Talk to your health care provider about whether hormone replacement therapy is right for you. Follow these instructions at home:  Schedule  regular health, dental, and eye exams.  Stay current with your immunizations.  Do not use any tobacco products including cigarettes, chewing tobacco, or electronic cigarettes.  If you are pregnant, do not drink alcohol.  If you are breastfeeding, limit how much and how often you drink alcohol.  Limit alcohol intake to no more than 1 drink per day for nonpregnant women. One drink equals 12 ounces of beer, 5 ounces of wine, or 1 ounces of hard liquor.  Do not use street drugs.  Do not share needles.  Ask your health care provider for help if you need support or information about quitting drugs.  Tell your health care provider if you often feel depressed.  Tell your health care provider if you have ever been abused or do not feel safe at home. This information is not intended to replace advice given to you by your health care provider. Make sure you discuss any questions you have with your health care provider. Document Released: 02/11/2011 Document Revised: 01/04/2016 Document Reviewed: 05/02/2015 Elsevier Interactive Patient Education  [employer].

## 2017-08-26 DIAGNOSIS — M9901 Segmental and somatic dysfunction of cervical region: Secondary | ICD-10-CM | POA: Diagnosis not present

## 2017-08-26 DIAGNOSIS — M9902 Segmental and somatic dysfunction of thoracic region: Secondary | ICD-10-CM | POA: Diagnosis not present

## 2017-08-26 DIAGNOSIS — M62830 Muscle spasm of back: Secondary | ICD-10-CM | POA: Diagnosis not present

## 2017-08-26 DIAGNOSIS — M5414 Radiculopathy, thoracic region: Secondary | ICD-10-CM | POA: Diagnosis not present

## 2017-08-26 LAB — LIPID PANEL
CHOLESTEROL: 163 mg/dL (ref <200)
HDL: 72 mg/dL (ref >50)
LDL CHOLESTEROL (CALC): 76 mg/dL
Non-HDL Cholesterol (Calc): 91 mg/dL (calc) (ref <130)
Total CHOL/HDL Ratio: 2.3 (calc) (ref <5.0)
Triglycerides: 69 mg/dL (ref <150)

## 2017-08-26 LAB — COMPREHENSIVE METABOLIC PANEL
AG RATIO: 1.6 (calc) (ref 1.0–2.5)
ALBUMIN MSPROF: 4.4 g/dL (ref 3.6–5.1)
ALKALINE PHOSPHATASE (APISO): 65 U/L (ref 33–130)
ALT: 11 U/L (ref 6–29)
AST: 13 U/L (ref 10–35)
BILIRUBIN TOTAL: 1 mg/dL (ref 0.2–1.2)
BUN: 13 mg/dL (ref 7–25)
CO2: 29 mmol/L (ref 20–32)
Calcium: 9.5 mg/dL (ref 8.6–10.4)
Chloride: 104 mmol/L (ref 98–110)
Creat: 0.77 mg/dL (ref 0.50–0.99)
Globulin: 2.8 g/dL (calc) (ref 1.9–3.7)
Glucose, Bld: 96 mg/dL (ref 65–99)
POTASSIUM: 4.9 mmol/L (ref 3.5–5.3)
Sodium: 140 mmol/L (ref 135–146)
Total Protein: 7.2 g/dL (ref 6.1–8.1)

## 2017-08-26 LAB — HEMOGLOBIN A1C
Hgb A1c MFr Bld: 5.4 % of total Hgb (ref <5.7)
Mean Plasma Glucose: 108 (calc)
eAG (mmol/L): 6 (calc)

## 2017-08-26 LAB — CBC WITH DIFFERENTIAL/PLATELET
BASOS ABS: 61 {cells}/uL (ref 0–200)
Basophils Relative: 0.9 %
Eosinophils Absolute: 68 cells/uL (ref 15–500)
Eosinophils Relative: 1 %
HEMATOCRIT: 34.8 % — AB (ref 35.0–45.0)
Hemoglobin: 10.5 g/dL — ABNORMAL LOW (ref 11.7–15.5)
LYMPHS ABS: 1489 {cells}/uL (ref 850–3900)
MCH: 20.5 pg — ABNORMAL LOW (ref 27.0–33.0)
MCHC: 30.2 g/dL — AB (ref 32.0–36.0)
MCV: 68.1 fL — AB (ref 80.0–100.0)
MPV: 9.9 fL (ref 7.5–12.5)
Monocytes Relative: 6.6 %
NEUTROS PCT: 69.6 %
Neutro Abs: 4733 cells/uL (ref 1500–7800)
Platelets: 281 10*3/uL (ref 140–400)
RBC: 5.11 10*6/uL — ABNORMAL HIGH (ref 3.80–5.10)
RDW: 15.5 % — AB (ref 11.0–15.0)
Total Lymphocyte: 21.9 %
WBC: 6.8 10*3/uL (ref 3.8–10.8)
WBCMIX: 449 {cells}/uL (ref 200–950)

## 2017-08-29 DIAGNOSIS — Z0000 Encounter for general adult medical examination without abnormal findings: Secondary | ICD-10-CM | POA: Insufficient documentation

## 2017-08-29 DIAGNOSIS — E782 Mixed hyperlipidemia: Secondary | ICD-10-CM | POA: Diagnosis not present

## 2017-08-29 DIAGNOSIS — I5181 Takotsubo syndrome: Secondary | ICD-10-CM | POA: Diagnosis not present

## 2017-08-29 DIAGNOSIS — I2510 Atherosclerotic heart disease of native coronary artery without angina pectoris: Secondary | ICD-10-CM | POA: Diagnosis not present

## 2017-08-29 DIAGNOSIS — I10 Essential (primary) hypertension: Secondary | ICD-10-CM | POA: Diagnosis not present

## 2017-08-29 NOTE — Assessment & Plan Note (Signed)
USPSTF grade A and B recommendations reviewed with patient; age-appropriate recommendations, preventive care, screening tests, etc discussed and encouraged; healthy living encouraged; see AVS for patient education given to patient  

## 2017-08-29 NOTE — Assessment & Plan Note (Signed)
Goal LDL less than 70; on aspirin and statin

## 2017-09-11 DIAGNOSIS — Z23 Encounter for immunization: Secondary | ICD-10-CM | POA: Diagnosis not present

## 2017-10-13 DIAGNOSIS — M9902 Segmental and somatic dysfunction of thoracic region: Secondary | ICD-10-CM | POA: Diagnosis not present

## 2017-10-13 DIAGNOSIS — M62830 Muscle spasm of back: Secondary | ICD-10-CM | POA: Diagnosis not present

## 2017-10-13 DIAGNOSIS — M5414 Radiculopathy, thoracic region: Secondary | ICD-10-CM | POA: Diagnosis not present

## 2017-10-13 DIAGNOSIS — M9901 Segmental and somatic dysfunction of cervical region: Secondary | ICD-10-CM | POA: Diagnosis not present

## 2017-11-11 ENCOUNTER — Ambulatory Visit: Admission: RE | Admit: 2017-11-11 | Payer: Medicare Other | Source: Ambulatory Visit

## 2017-11-11 ENCOUNTER — Telehealth: Payer: Self-pay

## 2017-11-11 NOTE — Telephone Encounter (Signed)
Patient notifed of appointments below.  Ct Chest 11/24/17 @ 2pm. Dr.Oaks 12/12/17 @ 11:15 am. Appointment reminders mailed.

## 2017-11-12 ENCOUNTER — Ambulatory Visit: Payer: Medicare Other

## 2017-11-14 ENCOUNTER — Ambulatory Visit: Payer: Self-pay | Admitting: Cardiothoracic Surgery

## 2017-11-24 ENCOUNTER — Ambulatory Visit: Payer: Medicare Other

## 2017-11-26 DIAGNOSIS — M170 Bilateral primary osteoarthritis of knee: Secondary | ICD-10-CM | POA: Diagnosis not present

## 2017-11-26 DIAGNOSIS — M25569 Pain in unspecified knee: Secondary | ICD-10-CM | POA: Diagnosis not present

## 2017-12-01 ENCOUNTER — Ambulatory Visit: Payer: Medicare Other | Admitting: Nurse Practitioner

## 2017-12-01 DIAGNOSIS — J069 Acute upper respiratory infection, unspecified: Secondary | ICD-10-CM | POA: Diagnosis not present

## 2017-12-05 ENCOUNTER — Ambulatory Visit (INDEPENDENT_AMBULATORY_CARE_PROVIDER_SITE_OTHER): Payer: Medicare Other

## 2017-12-05 ENCOUNTER — Encounter: Payer: Self-pay | Admitting: Family Medicine

## 2017-12-05 VITALS — BP 124/60 | HR 81 | Temp 98.3°F | Resp 16 | Ht 62.0 in | Wt 145.7 lb

## 2017-12-05 DIAGNOSIS — I2510 Atherosclerotic heart disease of native coronary artery without angina pectoris: Secondary | ICD-10-CM

## 2017-12-05 DIAGNOSIS — J209 Acute bronchitis, unspecified: Secondary | ICD-10-CM | POA: Diagnosis not present

## 2017-12-05 MED ORDER — ALBUTEROL SULFATE HFA 108 (90 BASE) MCG/ACT IN AERS: 2.0000 | INHALATION_SPRAY | RESPIRATORY_TRACT | 1 refills | Status: DC | PRN

## 2017-12-05 MED ORDER — HYDROCOD POLST-CPM POLST ER 10-8 MG/5ML PO SUER: 5.0000 mL | Freq: Two times a day (BID) | ORAL | 0 refills | Status: DC | PRN

## 2017-12-05 MED ORDER — PREDNISONE 20 MG PO TABS: 40.0000 mg | ORAL_TABLET | Freq: Every day | ORAL | 0 refills | Status: DC

## 2017-12-05 NOTE — Patient Instructions (Addendum)
Start the prednisone, take that with food If you need something for aches or pains, try to use Tylenol (acetaminophen) instead of non-steroidals (which include Aleve, ibuprofen, Advil, Motrin, and naproxen); non-steroidals can cause long-term kidney damage or cause a stomach ulcer with this prednisone Do NOT take any Ambien (zolpidem) while using the cough medicine Try vitamin C (orange juice if not diabetic or vitamin C tablets) and drink green tea to help your immune system during your illness Get plenty of rest and hydration  You can take plain Mucinex with your cough medicine at night, and Mucinex DM during the day if you need the extra cough suppressant; it would be fine to just take plain Mucinex around the clock  Acute Bronchitis, Adult Acute bronchitis is sudden (acute) swelling of the air tubes (bronchi) in the lungs. Acute bronchitis causes these tubes to fill with mucus, which can make it hard to breathe. It can also cause coughing or wheezing. In adults, acute bronchitis usually goes away within 2 weeks. A cough caused by bronchitis may last up to 3 weeks. Smoking, allergies, and asthma can make the condition worse. Repeated episodes of bronchitis may cause further lung problems, such as chronic obstructive pulmonary disease (COPD). What are the causes? This condition can be caused by germs and by substances that irritate the lungs, including:  Cold and flu viruses. This condition is most often caused by the same virus that causes a cold.  Bacteria.  Exposure to tobacco smoke, dust, fumes, and air pollution.  What increases the risk? This condition is more likely to develop in people who:  Have close contact with someone with acute bronchitis.  Are exposed to lung irritants, such as tobacco smoke, dust, fumes, and vapors.  Have a weak immune system.  Have a respiratory condition such as asthma.  What are the signs or symptoms? Symptoms of this condition include:  A  cough.  Coughing up clear, yellow, or green mucus.  Wheezing.  Chest congestion.  Shortness of breath.  A fever.  Body aches.  Chills.  A sore throat.  How is this diagnosed? This condition is usually diagnosed with a physical exam. During the exam, your health care provider may order tests, such as chest X-rays, to rule out other conditions. He or she may also:  Test a sample of your mucus for bacterial infection.  Check the level of oxygen in your blood. This is done to check for pneumonia.  Do a chest X-ray or lung function testing to rule out pneumonia and other conditions.  Perform blood tests.  Your health care provider will also ask about your symptoms and medical history. How is this treated? Most cases of acute bronchitis clear up over time without treatment. Your health care provider may recommend:  Drinking more fluids. Drinking more makes your mucus thinner, which may make it easier to breathe.  Taking a medicine for a fever or cough.  Taking an antibiotic medicine.  Using an inhaler to help improve shortness of breath and to control a cough.  Using a cool mist vaporizer or humidifier to make it easier to breathe.  Follow these instructions at home: Medicines  Take over-the-counter and prescription medicines only as told by your health care provider.  If you were prescribed an antibiotic, take it as told by your health care provider. Do not stop taking the antibiotic even if you start to feel better. General instructions  Get plenty of rest.  Drink enough fluids to keep your  urine clear or pale yellow.  Avoid smoking and secondhand smoke. Exposure to cigarette smoke or irritating chemicals will make bronchitis worse. If you smoke and you need help quitting, ask your health care provider. Quitting smoking will help your lungs heal faster.  Use an inhaler, cool mist vaporizer, or humidifier as told by your health care provider.  Keep all follow-up  visits as told by your health care provider. This is important. How is this prevented? To lower your risk of getting this condition again:  Wash your hands often with soap and water. If soap and water are not available, use hand sanitizer.  Avoid contact with people who have cold symptoms.  Try not to touch your hands to your mouth, nose, or eyes.  Make sure to get the flu shot every year.  Contact a health care provider if:  Your symptoms do not improve in 2 weeks of treatment. Get help right away if:  You cough up blood.  You have chest pain.  You have severe shortness of breath.  You become dehydrated.  You faint or keep feeling like you are going to faint.  You keep vomiting.  You have a severe headache.  Your fever or chills gets worse. This information is not intended to replace advice given to you by your health care provider. Make sure you discuss any questions you have with your health care provider. Document Released: 09/05/2004 Document Revised: 02/21/2016 Document Reviewed: 01/17/2016 Elsevier Interactive Patient Education  [employer].

## 2017-12-05 NOTE — Progress Notes (Signed)
BP 124/60   Pulse 81   Temp 98.3 F (36.8 C) (Oral)   Resp 16   Ht 5\' 2"  (1.575 m)   Wt 145 lb 11.2 oz (66.1 kg)   SpO2 96%   BMI 26.65 kg/m    Subjective:    Patient ID: Lydia Elliott, female    DOB: 04/30/1951, 67 y.o.   MRN: [id number]  HPI: Joeli Fenner is a 67 y.o. female  Chief Complaint  Patient presents with  . URI    onset 1 week went to walkin and said was just a cold and has got worse since.  Symptoms include: chills, cough, sob, congestion and phlem    HPI Patient is here for a sick visit Coughing, green and yellow phlegm; no blood with coughing, no hx of pneumonia Lost her voice some Took a z-pak from her husband She went to urgent care earlier and was told she had a cold Slight low grade fevers in the 99 range; just enough to feel like crap Body aches, "a lot is from coughing"; almost takes her breath away from coughing so hard Went to [place] in February; did not get sick; just came back from [place]; was on a plane Husband had umbilical surgery, did outpatient, she was not in the hospital Sinuses are drainage; ears are a little clogged  Depression screen [facility] 2/9 12/05/2017 08/25/2017 10/18/2016 04/22/2016 11/27/2015  Decreased Interest 0 0 0 1 0  Down, Depressed, Hopeless 0 0 0 1 0  PHQ - 2 Score 0 0 0 2 0  Altered sleeping - - - 0 -  Tired, decreased energy - - - 0 -  Change in appetite - - - 0 -  Feeling bad or failure about yourself  - - - 3 -  Trouble concentrating - - - 0 -  Moving slowly or fidgety/restless - - - 0 -  Suicidal thoughts - - - 0 -  PHQ-9 Score - - - 5 -  Difficult doing work/chores - - - Not difficult at all -    Relevant past medical, surgical, family and social history reviewed Past Medical History:  Diagnosis Date  . Arthritis    hands, knees, toes  . CAD (coronary artery disease)   . Chronic anemia   . GERD (gastroesophageal reflux disease)   . Heart disease    has a stent  . Hyperlipidemia   . Hypertension   .  Insomnia   . Microcytosis    Hgb 10.9, MCV 66 April 2015  . Nodule of right lung 10/18/2016  . Overweight (BMI 25.0-29.9) 12/23/2015  . Polyarthralgia   . Thalassemia   . Vertigo    none - 10 yrs  . Wears dentures    partial lower   Past Surgical History:  Procedure Laterality Date  . APPENDECTOMY  1967  . CARPAL TUNNEL RELEASE  2007  . CATARACT EXTRACTION  2013  . CHOLECYSTECTOMY  2000  . COLONOSCOPY  7/31//15   Dr.Wohl  . COLONOSCOPY WITH PROPOFOL N/A 12/11/2015   Procedure: COLONOSCOPY WITH PROPOFOL;  Surgeon: Lucilla Lame, MD;  Location: [facility];  Service: Endoscopy;  Laterality: N/A;  DESCENDING COLON POLYP   . CORONARY ANGIOPLASTY WITH STENT PLACEMENT  2010  . ESOPHAGOGASTRODUODENOSCOPY  02/21/14   Dr.Wohl  . ESOPHAGOGASTRODUODENOSCOPY (EGD) WITH PROPOFOL N/A 12/11/2015   Procedure: ESOPHAGOGASTRODUODENOSCOPY (EGD) WITH PROPOFOL;  Surgeon: Lucilla Lame, MD;  Location: [facility];  Service: Endoscopy;  Laterality: N/A;  . POLYPECTOMY N/A 12/11/2015  Procedure: POLYPECTOMY;  Surgeon: Lucilla Lame, MD;  Location: [facility];  Service: Endoscopy;  Laterality: N/A;  . TONSILLECTOMY AND ADENOIDECTOMY  1969   Family History  Problem Relation Age of Onset  . Cirrhosis Mother   . Hepatitis Mother   . Thalassemia Mother   . Diabetes Mother   . Heart disease Father   . Diabetes Father   . Hyperlipidemia Father   . Hypertension Father   . Stroke Father   . Cancer Sister        lung  . Diabetes Sister   . Stroke Sister        during a surgery  . Diabetes Brother   . Heart disease Brother   . Hypertension Brother   . Stroke Brother   . Thalassemia Maternal Grandmother   . COPD Neg Hx    Social History   Tobacco Use  . Smoking status: Former Smoker    Types: Cigarettes  . Smokeless tobacco: Never Used  . Tobacco comment: Social smoker   Substance Use Topics  . Alcohol use: Yes    Alcohol/week: 0.6 oz    Types: 1 Shots of liquor per week     Comment: socially  . Drug use: No    Interim medical history since last visit reviewed. Allergies and medications reviewed  Review of Systems Per HPI unless specifically indicated above     Objective:    BP 124/60   Pulse 81   Temp 98.3 F (36.8 C) (Oral)   Resp 16   Ht 5\' 2"  (1.575 m)   Wt 145 lb 11.2 oz (66.1 kg)   SpO2 96%   BMI 26.65 kg/m   Wt Readings from Last 3 Encounters:  12/05/17 145 lb 11.2 oz (66.1 kg)  08/25/17 147 lb 9.6 oz (67 kg)  05/30/17 151 lb (68.5 kg)    Physical Exam  Constitutional: She appears well-developed and well-nourished.  HENT:  Right Ear: Tympanic membrane and ear canal normal.  Left Ear: Tympanic membrane and ear canal normal.  Nose: Rhinorrhea (clear) present.  Mouth/Throat: Mucous membranes are normal. No posterior oropharyngeal edema or posterior oropharyngeal erythema.  Eyes: EOM are normal. No scleral icterus.  Cardiovascular: Normal rate and regular rhythm.  Pulmonary/Chest: Effort normal and breath sounds normal. She has no decreased breath sounds. She has no wheezes. She has no rhonchi.  Barky cough  Skin: She is not diaphoretic. No pallor.  Psychiatric: She has a normal mood and affect. Her behavior is normal.      Assessment & Plan:   Problem List Items Addressed This Visit    None    Visit Diagnoses    Acute bronchitis, unspecified organism    -  Primary   explained dx; will start prednisone, SABA; cough medicine; do NOT take cough medicine with ambien; may use plain mucinex or DM IF NOT on cough syrup       Follow up plan: Return if symptoms worsen or fail to improve.  An after-visit summary was printed and given to the patient at [facility].  Please see the patient instructions which may contain other information and recommendations beyond what is mentioned above in the assessment and plan.  Meds ordered this encounter  Medications  . chlorpheniramine-HYDROcodone (TUSSIONEX PENNKINETIC ER) 10-8 MG/5ML SUER     Sig: Take 5 mLs by mouth every 12 (twelve) hours as needed for cough. Do not drive for 12 hours after taking    Dispense:  115 mL  Refill:  0  . predniSONE (DELTASONE) 20 MG tablet    Sig: Take 2 tablets (40 mg total) by mouth daily with breakfast.    Dispense:  10 tablet    Refill:  0  . albuterol (PROAIR HFA) 108 (90 Base) MCG/ACT inhaler    Sig: Inhale 2 puffs into the lungs every 4 (four) hours as needed for wheezing or shortness of breath.    Dispense:  1 Inhaler    Refill:  1    No orders of the defined types were placed in this encounter.

## 2017-12-09 ENCOUNTER — Ambulatory Visit
Admission: RE | Admit: 2017-12-09 | Discharge: 2017-12-09 | Disposition: A | Discharge status: Home / Self Care | Payer: Medicare Other | Source: Ambulatory Visit | Attending: Cardiothoracic Surgery | Admitting: Cardiothoracic Surgery

## 2017-12-09 DIAGNOSIS — R911 Solitary pulmonary nodule: Secondary | ICD-10-CM

## 2017-12-09 DIAGNOSIS — I2510 Atherosclerotic heart disease of native coronary artery without angina pectoris: Secondary | ICD-10-CM | POA: Diagnosis not present

## 2017-12-09 DIAGNOSIS — I700 Atherosclerosis of aorta: Secondary | ICD-10-CM | POA: Diagnosis not present

## 2017-12-09 DIAGNOSIS — R918 Other nonspecific abnormal finding of lung field: Secondary | ICD-10-CM | POA: Diagnosis not present

## 2017-12-12 ENCOUNTER — Ambulatory Visit: Payer: Self-pay | Admitting: Cardiothoracic Surgery

## 2017-12-12 ENCOUNTER — Encounter: Payer: Self-pay | Admitting: Cardiothoracic Surgery

## 2017-12-12 ENCOUNTER — Ambulatory Visit (INDEPENDENT_AMBULATORY_CARE_PROVIDER_SITE_OTHER): Payer: Medicare Other | Admitting: Cardiothoracic Surgery

## 2017-12-12 VITALS — BP 171/75 | HR 67 | Temp 97.8°F | Resp 18 | Ht 62.0 in | Wt 147.0 lb

## 2017-12-12 DIAGNOSIS — I2510 Atherosclerotic heart disease of native coronary artery without angina pectoris: Secondary | ICD-10-CM

## 2017-12-12 DIAGNOSIS — R911 Solitary pulmonary nodule: Secondary | ICD-10-CM | POA: Diagnosis not present

## 2017-12-12 NOTE — Progress Notes (Signed)
  Patient ID: Lydia Elliott, female   DOB: 30-Sep-1950, 67 y.o.   MRN: [id number]  HISTORY: She returns today in follow-up.  She had a repeat CT scan done.  Her only interim problem is that she developed bronchitis after she returned from [place].  She was treated with some antibiotics and prednisone and felt better.  She states that her laryngitis is improving and her cough is improving as well.  She does not smoke.   Vitals:   12/12/17 0934  BP: (!) 171/75  Pulse: 67  Resp: 18  Temp: 97.8 F (36.6 C)  SpO2: 97%     EXAM:    Resp: Lungs are clear bilaterally.  No respiratory distress, normal effort. Heart:  Regular without murmurs Abd:  Abdomen is soft, non distended and non tender. No masses are palpable.  There is no rebound and no guarding.  Neurological: Alert and oriented to person, place, and time. Coordination normal.  Skin: Skin is warm and dry. No rash noted. No diaphoretic. No erythema. No pallor.  Psychiatric: Normal mood and affect. Normal behavior. Judgment and thought content normal.    ASSESSMENT: I have independently reviewed her chest CT.  There are some perifissural nodules which are unchanged.   PLAN:   I told her I would like to see her back again in 1 year with a repeat chest CT.  That will be her second anniversary at which point we can stop following these lung nodules if they are unchanged.    Nestor Lewandowsky, MD

## 2017-12-12 NOTE — Patient Instructions (Signed)
We will order your CT Scan and follow appointment and then call you.

## 2018-01-08 DIAGNOSIS — E782 Mixed hyperlipidemia: Secondary | ICD-10-CM | POA: Diagnosis not present

## 2018-01-08 DIAGNOSIS — I10 Essential (primary) hypertension: Secondary | ICD-10-CM | POA: Diagnosis not present

## 2018-01-08 DIAGNOSIS — I5181 Takotsubo syndrome: Secondary | ICD-10-CM | POA: Diagnosis not present

## 2018-01-08 DIAGNOSIS — I2510 Atherosclerotic heart disease of native coronary artery without angina pectoris: Secondary | ICD-10-CM | POA: Diagnosis not present

## 2018-02-11 DIAGNOSIS — M62830 Muscle spasm of back: Secondary | ICD-10-CM | POA: Diagnosis not present

## 2018-02-11 DIAGNOSIS — M9902 Segmental and somatic dysfunction of thoracic region: Secondary | ICD-10-CM | POA: Diagnosis not present

## 2018-02-11 DIAGNOSIS — M5414 Radiculopathy, thoracic region: Secondary | ICD-10-CM | POA: Diagnosis not present

## 2018-02-11 DIAGNOSIS — M9901 Segmental and somatic dysfunction of cervical region: Secondary | ICD-10-CM | POA: Diagnosis not present

## 2018-02-18 DIAGNOSIS — M9902 Segmental and somatic dysfunction of thoracic region: Secondary | ICD-10-CM | POA: Diagnosis not present

## 2018-02-18 DIAGNOSIS — M9901 Segmental and somatic dysfunction of cervical region: Secondary | ICD-10-CM | POA: Diagnosis not present

## 2018-02-18 DIAGNOSIS — M5414 Radiculopathy, thoracic region: Secondary | ICD-10-CM | POA: Diagnosis not present

## 2018-02-18 DIAGNOSIS — M62830 Muscle spasm of back: Secondary | ICD-10-CM | POA: Diagnosis not present

## 2018-02-18 IMAGING — CT CT ANGIO ABDOMEN
2 of 7 series · 15 of 46 positions shown, 17 images · IV contrast (APPLIED)
Comparison: Chest radiograph October 15, 2016 at 6866 hours

CLINICAL DATA: Sudden severe chest and epigastric pain radiating to
the back. History of hypertension, hyperlipidemia.

EXAM:
CT ANGIOGRAPHY CHEST AND ABDOMEN
TECHNIQUE: Multidetector CT imaging of the chest and abdomen was performed
using the standard protocol during bolus administration of
intravenous contrast. Multiplanar CT image reconstructions and MIPs
were obtained to evaluate the vascular anatomy.
CONTRAST:  100 cc Isovue 370

[Series 5: axial arterial · axial · arterial · 0.79mm/px · z∈[-475,-85]mm · 12 of 148 slices shown, 14 images]
[im 9/148  soft-tissue]
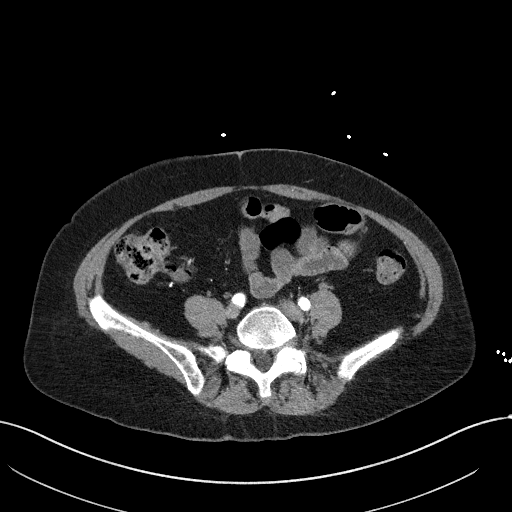
[im 9/148  bone]
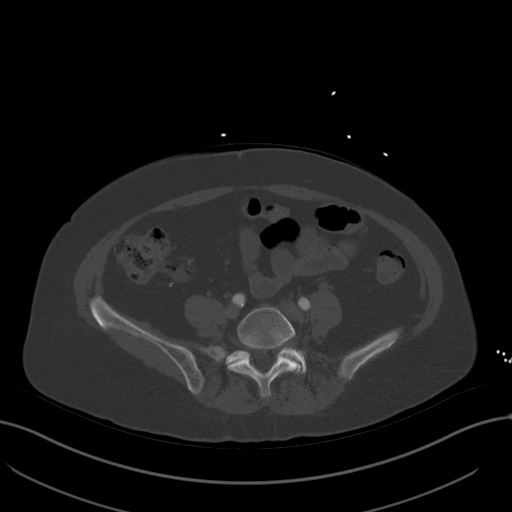
[im 26/148  soft-tissue]
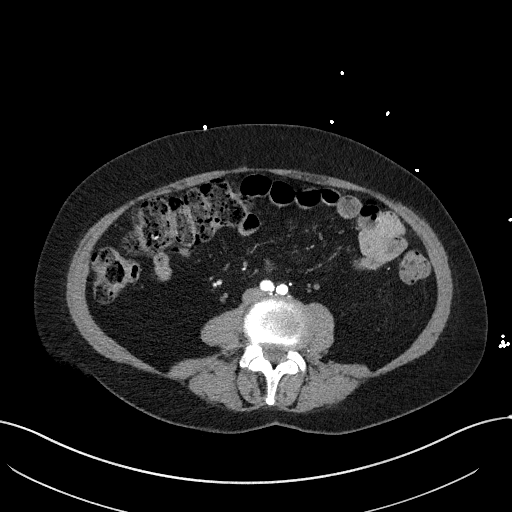
[im 35/148  soft-tissue]
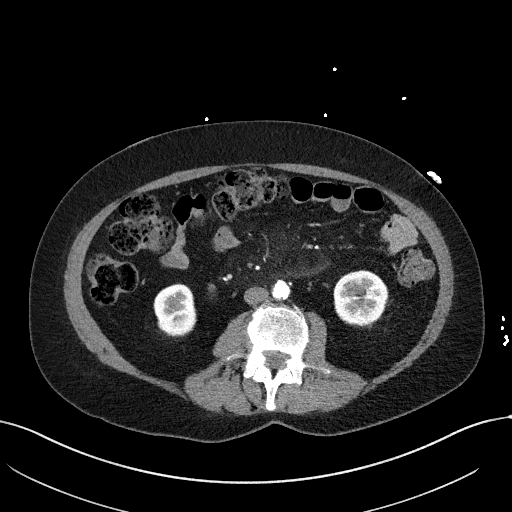
[im 44/148  soft-tissue]
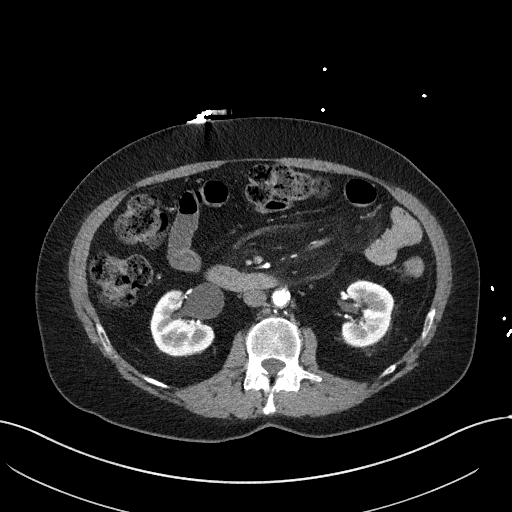
[im 61/148  soft-tissue]
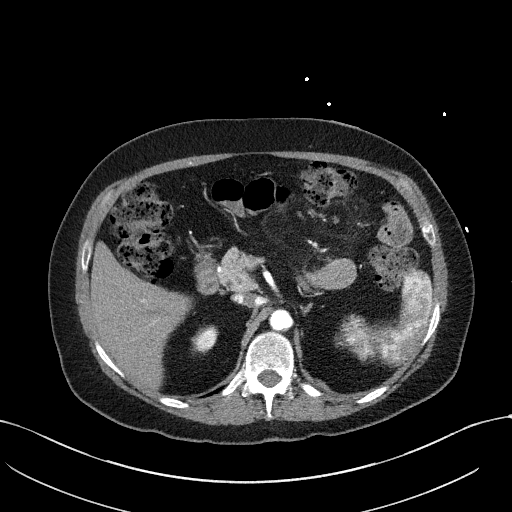
[im 70/148  soft-tissue]
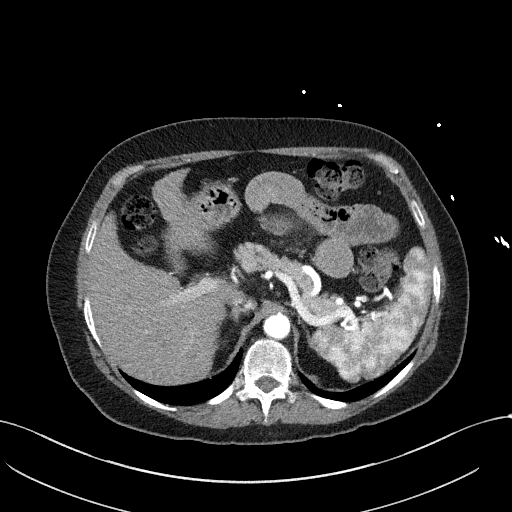
[im 78/148  soft-tissue]
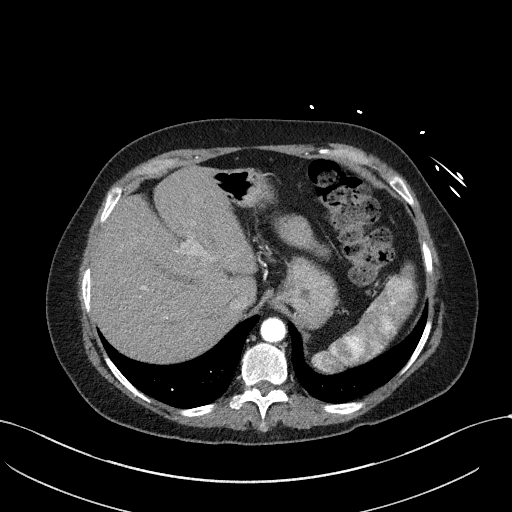
[im 96/148  soft-tissue]
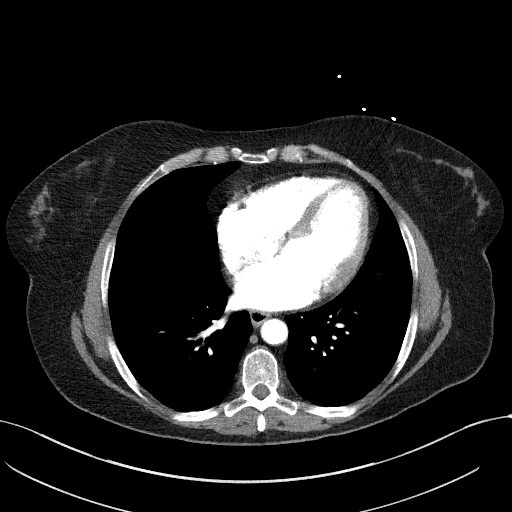
[im 104/148  soft-tissue]
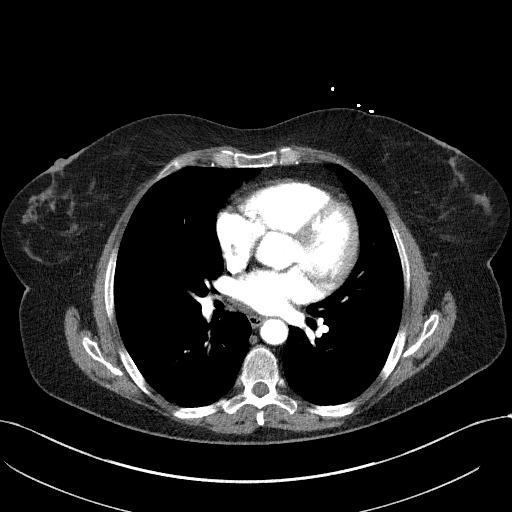
[im 104/148  bone]
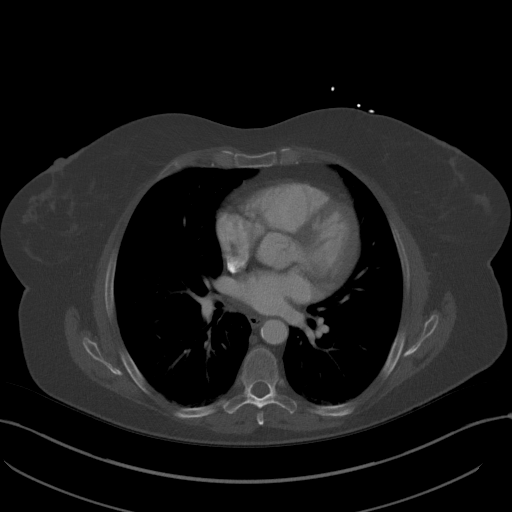
[im 113/148  soft-tissue]
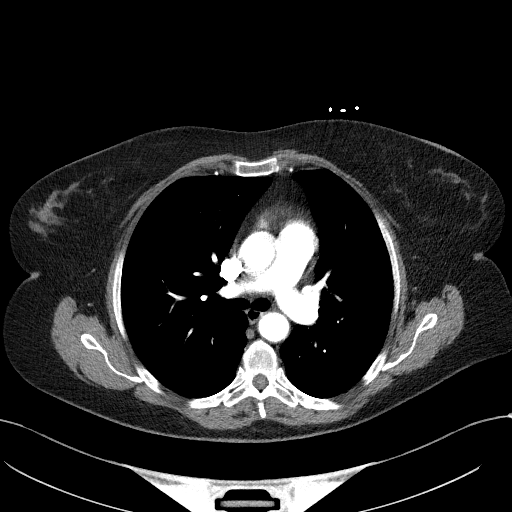
[im 130/148  soft-tissue]
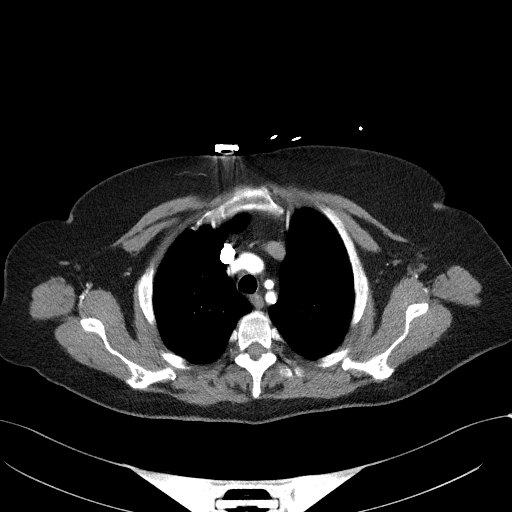
[im 139/148  soft-tissue]
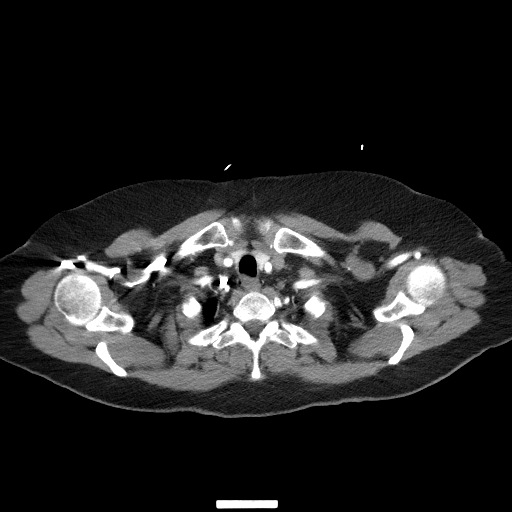

[Series 8: coronals · coronal · 0.77mm/px · 3 of 133 slices shown]
[im 34/133  soft-tissue]
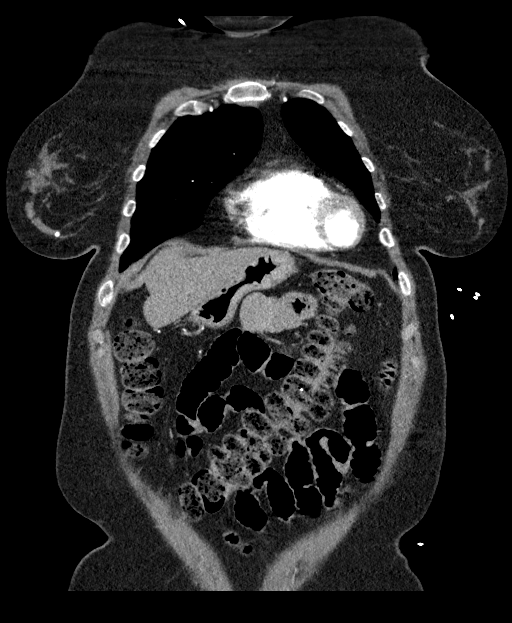
[im 67/133  soft-tissue]
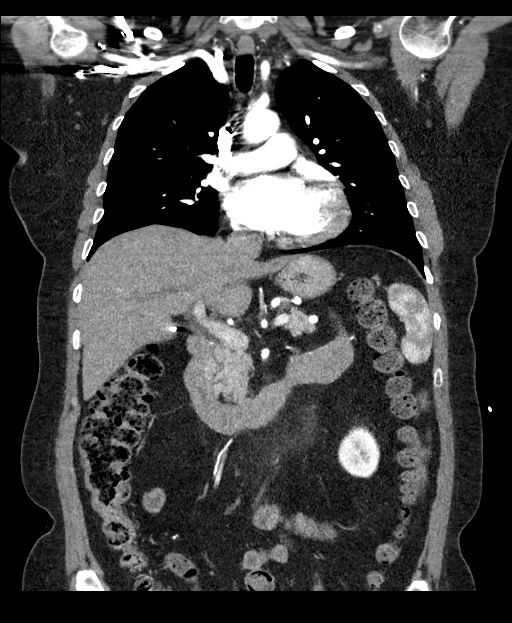
[im 100/133  soft-tissue]
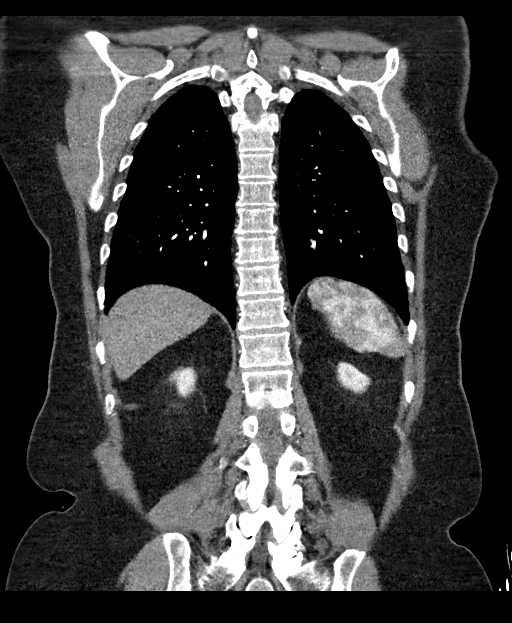

[15 of 46 positions shown; findings below may reference images not displayed]

FINDINGS: CTA CHEST FINDINGS

CARDIOVASCULAR: Thoracic aorta is normal course and caliber, mild
calcific atherosclerosis. No intrinsic density on noncontrast CT.
Homogeneous contrast opacification of thoracic aorta without
dissection, aneurysm, luminal irregularity, periaortic fluid
collections, or contrast extravasation. Heart size is normal. Mild
coronary artery calcifications. No pericardial effusion. Though not
tailored for evaluation, no central pulmonary embolus.

MEDIASTINUM/NODES: No mediastinal mass or lymphadenopathy by CT size
criteria.

LUNGS/PLEURA: Tracheobronchial tree is patent, no pneumothorax. No
pleural effusions, focal consolidations, or masses. 9 mm irregular
ground-glass nodule RIGHT lower lobe, axial 27/57. Dependent
atelectasis.

MUSCULOSKELETAL:  Nonsuspicious.  Bone island RIGHT humeral head.

Review of the MIP images confirms the above findings.

CTA ABDOMEN AND PELVIS FINDINGS

ARTERIES: Abdominal aorta is normal course and caliber, moderate
calcific atherosclerosis. Homogeneous contrast opacification of
aortoiliac vessels without dissection, aneurysm, luminal
irregularity, periaortic fluid collections, or contrast
extravasation. Celiac axis, superior and inferior mesenteric
arteries are normal.

HEPATOBILIARY: Liver, status post cholecystectomy.

PANCREAS: Normal.

SPLEEN: Normal, heterogeneous enhancement due to arterial bolus
timing.

ADRENALS/URINARY TRACT: Kidneys are orthotopic, demonstrating
symmetric enhancement. RIGHT extra renal pelvis. No nephrolithiasis,
hydronephrosis or solid renal masses. Normal adrenal glands.

STOMACH/BOWEL: Mildly prominent proximal jejunum at 3.4 cm. The
stomach, included large bowel are normal in course and caliber
without inflammatory changes. Small amount of small bowel feces
compatible with chronic stasis. Moderate retained large bowel stool.
Normal appendix.

VASCULAR/LYMPHATIC: No lymphadenopathy by CT size criteria. "Misty"
mesentery with a few small reactive lymph nodes.

OTHER: No intraperitoneal free fluid or free air.

MUSCULOSKELETAL: Nonacute. Osteopenia. Mild broad lumbar
levoscoliosis. Moderate lower lumbar facet arthropathy.

Review of the MIP images confirms the above findings.
IMPRESSION: CTA CHEST: No acute vascular process or acute cardiopulmonary
disease. Mild atherosclerosis of the thoracic aorta.

**An incidental finding of potential clinical significance has been
found. 11 mm solitary ground-glass nodule RIGHT lower lobe for which
follow-up CT at 6-12 months is recommended to confirm persistence.
This recommendation follows the consensus statement: Guidelines for
Management of Small Pulmonary Nodules Detected on CT Images: From
the [HOSPITAL] 6599; Radiology 6599; [DATE].**

CTA ABDOMEN AND PELVIS: No acute vascular process. Moderate
atherosclerosis of the abdominal aorta.

Mildly dilated jejunum associated with "misty" mesentery suggesting
mild ileus and reactive changes.

Moderate retained large bowel stool without bowel obstruction.

## 2018-02-20 DIAGNOSIS — M9901 Segmental and somatic dysfunction of cervical region: Secondary | ICD-10-CM | POA: Diagnosis not present

## 2018-02-20 DIAGNOSIS — M5414 Radiculopathy, thoracic region: Secondary | ICD-10-CM | POA: Diagnosis not present

## 2018-02-20 DIAGNOSIS — M62830 Muscle spasm of back: Secondary | ICD-10-CM | POA: Diagnosis not present

## 2018-02-20 DIAGNOSIS — M9902 Segmental and somatic dysfunction of thoracic region: Secondary | ICD-10-CM | POA: Diagnosis not present

## 2018-02-23 DIAGNOSIS — M9901 Segmental and somatic dysfunction of cervical region: Secondary | ICD-10-CM | POA: Diagnosis not present

## 2018-02-23 DIAGNOSIS — M62830 Muscle spasm of back: Secondary | ICD-10-CM | POA: Diagnosis not present

## 2018-02-23 DIAGNOSIS — M5414 Radiculopathy, thoracic region: Secondary | ICD-10-CM | POA: Diagnosis not present

## 2018-02-23 DIAGNOSIS — M9902 Segmental and somatic dysfunction of thoracic region: Secondary | ICD-10-CM | POA: Diagnosis not present

## 2018-03-02 DIAGNOSIS — M9902 Segmental and somatic dysfunction of thoracic region: Secondary | ICD-10-CM | POA: Diagnosis not present

## 2018-03-02 DIAGNOSIS — M9901 Segmental and somatic dysfunction of cervical region: Secondary | ICD-10-CM | POA: Diagnosis not present

## 2018-03-02 DIAGNOSIS — M62830 Muscle spasm of back: Secondary | ICD-10-CM | POA: Diagnosis not present

## 2018-03-02 DIAGNOSIS — M5414 Radiculopathy, thoracic region: Secondary | ICD-10-CM | POA: Diagnosis not present

## 2018-03-24 DIAGNOSIS — M62830 Muscle spasm of back: Secondary | ICD-10-CM | POA: Diagnosis not present

## 2018-03-24 DIAGNOSIS — M9901 Segmental and somatic dysfunction of cervical region: Secondary | ICD-10-CM | POA: Diagnosis not present

## 2018-03-24 DIAGNOSIS — M9902 Segmental and somatic dysfunction of thoracic region: Secondary | ICD-10-CM | POA: Diagnosis not present

## 2018-03-24 DIAGNOSIS — M5414 Radiculopathy, thoracic region: Secondary | ICD-10-CM | POA: Diagnosis not present

## 2018-03-25 DIAGNOSIS — M5414 Radiculopathy, thoracic region: Secondary | ICD-10-CM | POA: Diagnosis not present

## 2018-03-25 DIAGNOSIS — M62830 Muscle spasm of back: Secondary | ICD-10-CM | POA: Diagnosis not present

## 2018-03-25 DIAGNOSIS — M9902 Segmental and somatic dysfunction of thoracic region: Secondary | ICD-10-CM | POA: Diagnosis not present

## 2018-03-25 DIAGNOSIS — M9901 Segmental and somatic dysfunction of cervical region: Secondary | ICD-10-CM | POA: Diagnosis not present

## 2018-04-01 DIAGNOSIS — E782 Mixed hyperlipidemia: Secondary | ICD-10-CM | POA: Diagnosis not present

## 2018-04-01 DIAGNOSIS — I2510 Atherosclerotic heart disease of native coronary artery without angina pectoris: Secondary | ICD-10-CM | POA: Diagnosis not present

## 2018-04-01 DIAGNOSIS — I5181 Takotsubo syndrome: Secondary | ICD-10-CM | POA: Diagnosis not present

## 2018-04-01 DIAGNOSIS — I10 Essential (primary) hypertension: Secondary | ICD-10-CM | POA: Diagnosis not present

## 2018-04-20 DIAGNOSIS — M62830 Muscle spasm of back: Secondary | ICD-10-CM | POA: Diagnosis not present

## 2018-04-20 DIAGNOSIS — M5414 Radiculopathy, thoracic region: Secondary | ICD-10-CM | POA: Diagnosis not present

## 2018-04-20 DIAGNOSIS — M9901 Segmental and somatic dysfunction of cervical region: Secondary | ICD-10-CM | POA: Diagnosis not present

## 2018-04-20 DIAGNOSIS — Z23 Encounter for immunization: Secondary | ICD-10-CM | POA: Diagnosis not present

## 2018-04-20 DIAGNOSIS — M9902 Segmental and somatic dysfunction of thoracic region: Secondary | ICD-10-CM | POA: Diagnosis not present

## 2018-04-22 DIAGNOSIS — M5414 Radiculopathy, thoracic region: Secondary | ICD-10-CM | POA: Diagnosis not present

## 2018-04-22 DIAGNOSIS — M9902 Segmental and somatic dysfunction of thoracic region: Secondary | ICD-10-CM | POA: Diagnosis not present

## 2018-04-22 DIAGNOSIS — M9901 Segmental and somatic dysfunction of cervical region: Secondary | ICD-10-CM | POA: Diagnosis not present

## 2018-04-22 DIAGNOSIS — M62830 Muscle spasm of back: Secondary | ICD-10-CM | POA: Diagnosis not present

## 2018-04-24 DIAGNOSIS — M62830 Muscle spasm of back: Secondary | ICD-10-CM | POA: Diagnosis not present

## 2018-04-24 DIAGNOSIS — M5414 Radiculopathy, thoracic region: Secondary | ICD-10-CM | POA: Diagnosis not present

## 2018-04-24 DIAGNOSIS — M9901 Segmental and somatic dysfunction of cervical region: Secondary | ICD-10-CM | POA: Diagnosis not present

## 2018-04-24 DIAGNOSIS — M9902 Segmental and somatic dysfunction of thoracic region: Secondary | ICD-10-CM | POA: Diagnosis not present

## 2018-05-22 DIAGNOSIS — M170 Bilateral primary osteoarthritis of knee: Secondary | ICD-10-CM | POA: Diagnosis not present

## 2018-07-01 DIAGNOSIS — M9901 Segmental and somatic dysfunction of cervical region: Secondary | ICD-10-CM | POA: Diagnosis not present

## 2018-07-01 DIAGNOSIS — M5414 Radiculopathy, thoracic region: Secondary | ICD-10-CM | POA: Diagnosis not present

## 2018-07-01 DIAGNOSIS — M9902 Segmental and somatic dysfunction of thoracic region: Secondary | ICD-10-CM | POA: Diagnosis not present

## 2018-07-01 DIAGNOSIS — M62830 Muscle spasm of back: Secondary | ICD-10-CM | POA: Diagnosis not present

## 2018-07-02 DIAGNOSIS — I2510 Atherosclerotic heart disease of native coronary artery without angina pectoris: Secondary | ICD-10-CM | POA: Diagnosis not present

## 2018-07-02 DIAGNOSIS — E782 Mixed hyperlipidemia: Secondary | ICD-10-CM | POA: Diagnosis not present

## 2018-07-02 DIAGNOSIS — I5181 Takotsubo syndrome: Secondary | ICD-10-CM | POA: Diagnosis not present

## 2018-07-02 DIAGNOSIS — I10 Essential (primary) hypertension: Secondary | ICD-10-CM | POA: Diagnosis not present

## 2018-07-03 DIAGNOSIS — M9902 Segmental and somatic dysfunction of thoracic region: Secondary | ICD-10-CM | POA: Diagnosis not present

## 2018-07-03 DIAGNOSIS — M5414 Radiculopathy, thoracic region: Secondary | ICD-10-CM | POA: Diagnosis not present

## 2018-07-03 DIAGNOSIS — M9901 Segmental and somatic dysfunction of cervical region: Secondary | ICD-10-CM | POA: Diagnosis not present

## 2018-07-03 DIAGNOSIS — M62830 Muscle spasm of back: Secondary | ICD-10-CM | POA: Diagnosis not present

## 2018-07-06 DIAGNOSIS — M62830 Muscle spasm of back: Secondary | ICD-10-CM | POA: Diagnosis not present

## 2018-07-06 DIAGNOSIS — M5414 Radiculopathy, thoracic region: Secondary | ICD-10-CM | POA: Diagnosis not present

## 2018-07-06 DIAGNOSIS — M9901 Segmental and somatic dysfunction of cervical region: Secondary | ICD-10-CM | POA: Diagnosis not present

## 2018-07-06 DIAGNOSIS — M9902 Segmental and somatic dysfunction of thoracic region: Secondary | ICD-10-CM | POA: Diagnosis not present

## 2018-07-20 DIAGNOSIS — M9901 Segmental and somatic dysfunction of cervical region: Secondary | ICD-10-CM | POA: Diagnosis not present

## 2018-07-20 DIAGNOSIS — M9902 Segmental and somatic dysfunction of thoracic region: Secondary | ICD-10-CM | POA: Diagnosis not present

## 2018-07-20 DIAGNOSIS — M62830 Muscle spasm of back: Secondary | ICD-10-CM | POA: Diagnosis not present

## 2018-07-20 DIAGNOSIS — M5414 Radiculopathy, thoracic region: Secondary | ICD-10-CM | POA: Diagnosis not present

## 2018-07-22 DIAGNOSIS — M9902 Segmental and somatic dysfunction of thoracic region: Secondary | ICD-10-CM | POA: Diagnosis not present

## 2018-07-22 DIAGNOSIS — M62830 Muscle spasm of back: Secondary | ICD-10-CM | POA: Diagnosis not present

## 2018-07-22 DIAGNOSIS — M9901 Segmental and somatic dysfunction of cervical region: Secondary | ICD-10-CM | POA: Diagnosis not present

## 2018-07-22 DIAGNOSIS — M5414 Radiculopathy, thoracic region: Secondary | ICD-10-CM | POA: Diagnosis not present

## 2018-10-05 DIAGNOSIS — I2510 Atherosclerotic heart disease of native coronary artery without angina pectoris: Secondary | ICD-10-CM | POA: Diagnosis not present

## 2018-10-05 DIAGNOSIS — I10 Essential (primary) hypertension: Secondary | ICD-10-CM | POA: Diagnosis not present

## 2018-10-05 DIAGNOSIS — E782 Mixed hyperlipidemia: Secondary | ICD-10-CM | POA: Diagnosis not present

## 2018-10-05 DIAGNOSIS — I5181 Takotsubo syndrome: Secondary | ICD-10-CM | POA: Diagnosis not present

## 2019-01-06 ENCOUNTER — Other Ambulatory Visit: Payer: Self-pay

## 2019-01-06 DIAGNOSIS — R911 Solitary pulmonary nodule: Secondary | ICD-10-CM

## 2019-01-08 DIAGNOSIS — M25562 Pain in left knee: Secondary | ICD-10-CM | POA: Diagnosis not present

## 2019-01-08 DIAGNOSIS — M25561 Pain in right knee: Secondary | ICD-10-CM | POA: Diagnosis not present

## 2019-01-26 ENCOUNTER — Other Ambulatory Visit: Payer: Self-pay

## 2019-01-26 ENCOUNTER — Ambulatory Visit (INDEPENDENT_AMBULATORY_CARE_PROVIDER_SITE_OTHER): Payer: Medicare Other | Admitting: Nurse Practitioner

## 2019-01-26 ENCOUNTER — Encounter: Payer: Self-pay | Admitting: Nurse Practitioner

## 2019-01-26 VITALS — Resp 16

## 2019-01-26 DIAGNOSIS — F418 Other specified anxiety disorders: Secondary | ICD-10-CM | POA: Diagnosis not present

## 2019-01-26 DIAGNOSIS — F330 Major depressive disorder, recurrent, mild: Secondary | ICD-10-CM

## 2019-01-26 DIAGNOSIS — K219 Gastro-esophageal reflux disease without esophagitis: Secondary | ICD-10-CM

## 2019-01-26 DIAGNOSIS — R911 Solitary pulmonary nodule: Secondary | ICD-10-CM | POA: Diagnosis not present

## 2019-01-26 DIAGNOSIS — I700 Atherosclerosis of aorta: Secondary | ICD-10-CM | POA: Diagnosis not present

## 2019-01-26 DIAGNOSIS — K582 Mixed irritable bowel syndrome: Secondary | ICD-10-CM

## 2019-01-26 MED ORDER — LOPERAMIDE HCL 2 MG PO CAPS: 2.0000 mg | ORAL_CAPSULE | Freq: Two times a day (BID) | ORAL | 0 refills | Status: DC | PRN

## 2019-01-26 MED ORDER — DOCUSATE SODIUM 100 MG PO CAPS: 100.0000 mg | ORAL_CAPSULE | Freq: Two times a day (BID) | ORAL | 0 refills | Status: DC | PRN

## 2019-01-26 MED ORDER — DICYCLOMINE HCL 20 MG PO TABS: 20.0000 mg | ORAL_TABLET | Freq: Three times a day (TID) | ORAL | 1 refills | Status: DC | PRN

## 2019-01-26 NOTE — Progress Notes (Signed)
Virtual Visit via Video Note  I connected with Lydia Elliott on 01/26/19 at 11:20 AM EDT by a video enabled telemedicine application and verified that I am speaking with the correct person using two identifiers.   Staff discussed the limitations of evaluation and management by telemedicine and the availability of in person appointments. The patient expressed understanding and agreed to proceed.  Patient location: home  My location: work office Other people present: none HPI  Patient has had stomach issues for over 10 years- alternates between constipation and diarrhea- notices this with stress. Overall she feels she is a very anxious person but states lately has been having a lot of anxiety due to situation with pandemic, and loss of neighbor, and her kid having surgery in another state and she cannot be there. Patient had diarrhea 4 days ago and has not not had a bowel movement since then. Denies nausea and vomiting. She notices that she has some abdominal cramping after eating meals that is relieve with defecation.   Patient is taking lexapro 10mg  daily, this is prescribed by Dr. Ubaldo Glassing who she sees for cardiomyopathy, CAD, hypertension, hyperlipidemia. Last seen by cardiology on 09/2018. She is taking coreg 6.25mg  BID, losartan 25mg  daily for blood pressure. She had a PCI done in 2010. She remains on dual anti-platelet therapy with aspirin and clopidogrel since then has not noticed blood in her stools. She is taking simvastatin at 40 mg daily for hyperlipidemia.  Patient takes omeprazole for GERD- worsens with stress.   GAD 7 : Generalized Anxiety Score 01/26/2019  Nervous, Anxious, on Edge 1  Control/stop worrying 1  Worry too much - different things 1  Trouble relaxing 1  Restless 1  Easily annoyed or irritable 1  Afraid - awful might happen 1  Total GAD 7 Score 7  Anxiety Difficulty Somewhat difficult     PHQ2/9: Depression screen [facility] 2/9 01/26/2019 12/05/2017 08/25/2017 10/18/2016  04/22/2016  Decreased Interest 1 0 0 0 1  Down, Depressed, Hopeless 1 0 0 0 1  PHQ - 2 Score 2 0 0 0 2  Altered sleeping 3 - - - 0  Tired, decreased energy 0 - - - 0  Change in appetite 0 - - - 0  Feeling bad or failure about yourself  1 - - - 3  Trouble concentrating 0 - - - 0  Moving slowly or fidgety/restless 1 - - - 0  Suicidal thoughts 0 - - - 0  PHQ-9 Score 7 - - - 5  Difficult doing work/chores Somewhat difficult - - - Not difficult at all    PHQ reviewed. Positive  Patient Active Problem List   Diagnosis Date Noted  . Medicare annual wellness visit, subsequent 08/29/2017  . Aortic atherosclerosis ([place]) 10/18/2016  . Nodule of right lung 10/18/2016  . Knee pain 10/01/2016  . GERD (gastroesophageal reflux disease) 04/24/2016  . Anxiety about health 12/23/2015  . Overweight (BMI 25.0-29.9) 12/23/2015  . Blood in stool   . Benign neoplasm of descending colon   . Guaiac positive stools 07/11/2015  . Impaired fasting glucose 07/03/2015  . Anemia 07/03/2015  . Takotsubo cardiomyopathy 06/18/2015  . Medication monitoring encounter 06/18/2015  . Abdominal pain, epigastric 06/18/2015  . Fatigue 06/14/2015  . Hypertension   . Hyperlipidemia   . Insomnia   . Polyarthralgia   . CAD (coronary artery disease)   . Thalassemia   . Heart disease     Past Medical History:  Diagnosis Date  . Arthritis  hands, knees, toes  . CAD (coronary artery disease)   . Chronic anemia   . GERD (gastroesophageal reflux disease)   . Heart disease    has a stent  . Hyperlipidemia   . Hypertension   . Insomnia   . Microcytosis    Hgb 10.9, MCV 66 April 2015  . Nodule of right lung 10/18/2016  . Overweight (BMI 25.0-29.9) 12/23/2015  . Polyarthralgia   . Thalassemia   . Vertigo    none - 10 yrs  . Wears dentures    partial lower    Past Surgical History:  Procedure Laterality Date  . APPENDECTOMY  1967  . CARPAL TUNNEL RELEASE  2007  . CATARACT EXTRACTION  2013  .  CHOLECYSTECTOMY  2000  . COLONOSCOPY  7/31//15   Dr.Wohl  . COLONOSCOPY WITH PROPOFOL N/A 12/11/2015   Procedure: COLONOSCOPY WITH PROPOFOL;  Surgeon: Lucilla Lame, MD;  Location: [place];  Service: Endoscopy;  Laterality: N/A;  DESCENDING COLON POLYP   . CORONARY ANGIOPLASTY WITH STENT PLACEMENT  2010  . ESOPHAGOGASTRODUODENOSCOPY  02/21/14   Dr.Wohl  . ESOPHAGOGASTRODUODENOSCOPY (EGD) WITH PROPOFOL N/A 12/11/2015   Procedure: ESOPHAGOGASTRODUODENOSCOPY (EGD) WITH PROPOFOL;  Surgeon: Lucilla Lame, MD;  Location: [place];  Service: Endoscopy;  Laterality: N/A;  . POLYPECTOMY N/A 12/11/2015   Procedure: POLYPECTOMY;  Surgeon: Lucilla Lame, MD;  Location: [place];  Service: Endoscopy;  Laterality: N/A;  . TONSILLECTOMY AND ADENOIDECTOMY  1969    Social History   Tobacco Use  . Smoking status: Former Smoker    Types: Cigarettes  . Smokeless tobacco: Never Used  . Tobacco comment: Social smoker   Substance Use Topics  . Alcohol use: Yes    Alcohol/week: 1.0 standard drinks    Types: 1 Shots of liquor per week    Comment: socially     Current Outpatient Medications:  .  albuterol (PROAIR HFA) 108 (90 Base) MCG/ACT inhaler, Inhale 2 puffs into the lungs every 4 (four) hours as needed for wheezing or shortness of breath., Disp: 1 Inhaler, Rfl: 1 .  aspirin EC 81 MG tablet, Take 81 mg by mouth daily., Disp: , Rfl:  .  carvedilol (COREG) 6.25 MG tablet, Take 6.25 mg by mouth 2 (two) times daily with a meal. , Disp: , Rfl:  .  desoximetasone (TOPICORT) 0.25 % cream, 1(ONE) APPLICATION(S) TOPICAL 2(TWO) TIMES A DAY X 2 WEEKS UNTIL SYMPTOM IMPROVE, Disp: , Rfl: 0 .  escitalopram (LEXAPRO) 10 MG tablet, Take 10 mg by mouth daily., Disp: , Rfl:  .  losartan (COZAAR) 25 MG tablet, Take 25 mg by mouth 2 (two) times a day. , Disp: , Rfl:  .  Multiple Vitamin (MULTIVITAMIN) tablet, Take 1 tablet by mouth daily., Disp: , Rfl:  .  omeprazole (PRILOSEC) 20 MG capsule, Take  20 mg by mouth daily., Disp: , Rfl:  .  simvastatin (ZOCOR) 40 MG tablet, Take 40 mg by mouth at bedtime. , Disp: , Rfl:  .  TURMERIC PO, Take by mouth daily., Disp: , Rfl:  .  zolpidem (AMBIEN) 10 MG tablet, Take 5 mg by mouth at bedtime as needed. , Disp: , Rfl:  .  Cyanocobalamin (VITAMIN B-12) 500 MCG SUBL, Place 1 tablet under the tongue daily., Disp: , Rfl:  .  dicyclomine (BENTYL) 20 MG tablet, Take 1 tablet (20 mg total) by mouth every 8 (eight) hours as needed for spasms (abdominal spasms)., Disp: 30 tablet, Rfl: 1 .  docusate sodium (  COLACE) 100 MG capsule, Take 1 capsule (100 mg total) by mouth 2 (two) times daily as needed for mild constipation., Disp: 30 capsule, Rfl: 0 .  lisinopril (PRINIVIL,ZESTRIL) 2.5 MG tablet, Take 2.5 mg by mouth daily. , Disp: , Rfl:  .  loperamide (IMODIUM) 2 MG capsule, Take 1 capsule (2 mg total) by mouth 2 (two) times daily between meals as needed for diarrhea or loose stools (45 minutes before meal if having significant stress)., Disp: 30 capsule, Rfl: 0  Allergies  Allergen Reactions  . No Known Allergies     ROS   No other specific complaints in a complete review of systems (except as listed in HPI above).  Objective  Vitals:   01/26/19 1151  Resp: 16     There is no height or weight on file to calculate BMI.  Nursing Note and Vital Signs reviewed.  Physical Exam  Constitutional: Patient appears well-developed and well-nourished. No distress.  HENT: Head: Normocephalic and atraumatic. Pulmonary/Chest: Effort normal  Musculoskeletal: Normal range of motion,  Neurological: alert and oriented, speech normal.  Skin: No rash noted. No erythema.  Psychiatric: Patient has a normal mood and affect. behavior is normal. Judgment and thought content normal.   Assessment & Plan  1. Irritable bowel syndrome with both constipation and diarrhea Discussed Low FODMAP diet.  - dicyclomine (BENTYL) 20 MG tablet; Take 1 tablet (20 mg total) by  mouth every 8 (eight) hours as needed for spasms (abdominal spasms).  Dispense: 30 tablet; Refill: 1 - docusate sodium (COLACE) 100 MG capsule; Take 1 capsule (100 mg total) by mouth 2 (two) times daily as needed for mild constipation.  Dispense: 30 capsule; Refill: 0 - loperamide (IMODIUM) 2 MG capsule; Take 1 capsule (2 mg total) by mouth 2 (two) times daily between meals as needed for diarrhea or loose stools (45 minutes before meal if having significant stress).  Dispense: 30 capsule; Refill: 0  2. Anxiety about health Increase lexapro to 20mg  daily, discussed we are happy to take over this from cardiology- she will contact cards and let us know if needed.   3. Nodule of right lung Would like to post-pone testing due pandemic- discussed as it was stable last time and she is asymptomatic, if she would like can post-pone for 3-6 months; recommend speaking to ordering provider.   4. Gastroesophageal reflux disease without esophagitis stable  5. Aortic atherosclerosis (H[facility]) Diet and lipid management.   6. Mild episode of recurrent major depressive disorder ([place]) Given counseling options and recommended increasing lexapro to 20mg  daily.    Follow Up Instructions:  3 months   I discussed the assessment and treatment plan with the patient. The patient was provided an opportunity to ask questions and all were answered. The patient agreed with the plan and demonstrated an understanding of the instructions.   The patient was advised to call back or seek an in-person evaluation if the symptoms worsen or if the condition fails to improve as anticipated.  I provided 24 minutes of non-face-to-face time during this encounter.   Fredderick Severance, NP

## 2019-02-01 ENCOUNTER — Ambulatory Visit: Payer: Medicare Other

## 2019-02-05 ENCOUNTER — Ambulatory Visit: Payer: Medicare Other | Admitting: Cardiothoracic Surgery

## 2019-02-26 ENCOUNTER — Telehealth: Payer: Self-pay

## 2019-02-26 NOTE — Telephone Encounter (Signed)
appt scheduled

## 2019-02-26 NOTE — Telephone Encounter (Signed)
Copied from [place] [id number]. Topic: General - Other >> Feb 26, 2019  9:07 AM Keene Breath wrote: Reason for CRM: Patient called to speak with the doctor about her anxiety.  She is former patient of Dr. Sanda Klein and would like to speak with a NP or another doctor to see if she can get some medication.  CB# [phone]

## 2019-02-26 NOTE — Telephone Encounter (Signed)
Please schedule appointment can be virtual.

## 2019-03-02 ENCOUNTER — Ambulatory Visit (INDEPENDENT_AMBULATORY_CARE_PROVIDER_SITE_OTHER): Payer: Medicare Other | Admitting: Nurse Practitioner

## 2019-03-02 ENCOUNTER — Encounter: Payer: Self-pay | Admitting: Nurse Practitioner

## 2019-03-02 VITALS — BP 165/77

## 2019-03-02 DIAGNOSIS — F410 Panic disorder [episodic paroxysmal anxiety] without agoraphobia: Secondary | ICD-10-CM | POA: Diagnosis not present

## 2019-03-02 DIAGNOSIS — F411 Generalized anxiety disorder: Secondary | ICD-10-CM | POA: Diagnosis not present

## 2019-03-02 DIAGNOSIS — I10 Essential (primary) hypertension: Secondary | ICD-10-CM | POA: Diagnosis not present

## 2019-03-02 MED ORDER — HYDROXYZINE PAMOATE 25 MG PO CAPS: 25.0000 mg | ORAL_CAPSULE | Freq: Two times a day (BID) | ORAL | 0 refills | Status: DC | PRN

## 2019-03-02 MED ORDER — ESCITALOPRAM OXALATE 20 MG PO TABS: 20.0000 mg | ORAL_TABLET | Freq: Every day | ORAL | 0 refills | Status: DC

## 2019-03-02 NOTE — Progress Notes (Signed)
Virtual Visit via Video Note  I connected with Lydia Elliott on 03/02/19 at 10:00 AM EDT by a video enabled telemedicine application and verified that I am speaking with the correct person using two identifiers.   Staff discussed the limitations of evaluation and management by telemedicine and the availability of in person appointments. The patient expressed understanding and agreed to proceed.  Patient location: home  My location: work office Other people present: none HPI  Last month patient was seen for increased anxiety and IBS. She was recommended low-fodmap diet and provided bentyl, and colace and imodium PRN for symptom management. Additionally her lexapro was increased to 20mg  daily. States she did not increase dose to due to fear from reading about it and concern for stopping  Eating prunes daily and staying away from some high FODMAP foods and has improved BM  States last Wednesday she felt very anxious, she forgot to eat and take her medicine. Her anxiety increased and she started to feel dizzy and her hands and feet were burning and her husband called 911- her BP was 228/90. She took her medicines and did deep breathing and her blood pressure came down. States about 3 years ago she had a similar thing and went to ER.     PHQ2/9: Depression screen [facility] 2/9 03/02/2019 01/26/2019 12/05/2017 08/25/2017 10/18/2016  Decreased Interest 0 1 0 0 0  Down, Depressed, Hopeless 0 1 0 0 0  PHQ - 2 Score 0 2 0 0 0  Altered sleeping 0 3 - - -  Tired, decreased energy 0 0 - - -  Change in appetite 0 0 - - -  Feeling bad or failure about yourself  0 1 - - -  Trouble concentrating 0 0 - - -  Moving slowly or fidgety/restless 0 1 - - -  Suicidal thoughts 0 0 - - -  PHQ-9 Score 0 7 - - -  Difficult doing work/chores Not difficult at all Somewhat difficult - - -     PHQ reviewed. Negative  Patient Active Problem List   Diagnosis Date Noted  . Medicare annual wellness visit, subsequent  08/29/2017  . Aortic atherosclerosis ([place]) 10/18/2016  . Nodule of right lung 10/18/2016  . Knee pain 10/01/2016  . GERD (gastroesophageal reflux disease) 04/24/2016  . Anxiety about health 12/23/2015  . Overweight (BMI 25.0-29.9) 12/23/2015  . Blood in stool   . Benign neoplasm of descending colon   . Guaiac positive stools 07/11/2015  . Impaired fasting glucose 07/03/2015  . Anemia 07/03/2015  . Takotsubo cardiomyopathy 06/18/2015  . Medication monitoring encounter 06/18/2015  . Abdominal pain, epigastric 06/18/2015  . Fatigue 06/14/2015  . Hypertension   . Hyperlipidemia   . Insomnia   . Polyarthralgia   . CAD (coronary artery disease)   . Thalassemia   . Heart disease     Past Medical History:  Diagnosis Date  . Arthritis    hands, knees, toes  . CAD (coronary artery disease)   . Chronic anemia   . GERD (gastroesophageal reflux disease)   . Heart disease    has a stent  . Hyperlipidemia   . Hypertension   . Insomnia   . Microcytosis    Hgb 10.9, MCV 66 April 2015  . Nodule of right lung 10/18/2016  . Overweight (BMI 25.0-29.9) 12/23/2015  . Polyarthralgia   . Thalassemia   . Vertigo    none - 10 yrs  . Wears dentures    partial lower  Past Surgical History:  Procedure Laterality Date  . APPENDECTOMY  1967  . CARPAL TUNNEL RELEASE  2007  . CATARACT EXTRACTION  2013  . CHOLECYSTECTOMY  2000  . COLONOSCOPY  7/31//15   Dr.Wohl  . COLONOSCOPY WITH PROPOFOL N/A 12/11/2015   Procedure: COLONOSCOPY WITH PROPOFOL;  Surgeon: Lucilla Lame, MD;  Location: [place];  Service: Endoscopy;  Laterality: N/A;  DESCENDING COLON POLYP   . CORONARY ANGIOPLASTY WITH STENT PLACEMENT  2010  . ESOPHAGOGASTRODUODENOSCOPY  02/21/14   Dr.Wohl  . ESOPHAGOGASTRODUODENOSCOPY (EGD) WITH PROPOFOL N/A 12/11/2015   Procedure: ESOPHAGOGASTRODUODENOSCOPY (EGD) WITH PROPOFOL;  Surgeon: Lucilla Lame, MD;  Location: [place];  Service: Endoscopy;  Laterality: N/A;  .  POLYPECTOMY N/A 12/11/2015   Procedure: POLYPECTOMY;  Surgeon: Lucilla Lame, MD;  Location: [place];  Service: Endoscopy;  Laterality: N/A;  . TONSILLECTOMY AND ADENOIDECTOMY  1969    Social History   Tobacco Use  . Smoking status: Former Smoker    Types: Cigarettes  . Smokeless tobacco: Never Used  . Tobacco comment: Social smoker   Substance Use Topics  . Alcohol use: Yes    Alcohol/week: 1.0 standard drinks    Types: 1 Shots of liquor per week    Comment: socially     Current Outpatient Medications:  .  albuterol (PROAIR HFA) 108 (90 Base) MCG/ACT inhaler, Inhale 2 puffs into the lungs every 4 (four) hours as needed for wheezing or shortness of breath., Disp: 1 Inhaler, Rfl: 1 .  aspirin EC 81 MG tablet, Take 81 mg by mouth daily., Disp: , Rfl:  .  desoximetasone (TOPICORT) 0.25 % cream, 1(ONE) APPLICATION(S) TOPICAL 2(TWO) TIMES A DAY X 2 WEEKS UNTIL SYMPTOM IMPROVE, Disp: , Rfl: 0 .  dicyclomine (BENTYL) 20 MG tablet, Take 1 tablet (20 mg total) by mouth every 8 (eight) hours as needed for spasms (abdominal spasms)., Disp: 30 tablet, Rfl: 1 .  docusate sodium (COLACE) 100 MG capsule, Take 1 capsule (100 mg total) by mouth 2 (two) times daily as needed for mild constipation., Disp: 30 capsule, Rfl: 0 .  escitalopram (LEXAPRO) 10 MG tablet, Take 10 mg by mouth daily., Disp: , Rfl:  .  loperamide (IMODIUM) 2 MG capsule, Take 1 capsule (2 mg total) by mouth 2 (two) times daily between meals as needed for diarrhea or loose stools (45 minutes before meal if having significant stress)., Disp: 30 capsule, Rfl: 0 .  losartan (COZAAR) 25 MG tablet, Take 25 mg by mouth 2 (two) times a day. , Disp: , Rfl:  .  Multiple Vitamin (MULTIVITAMIN) tablet, Take 1 tablet by mouth daily., Disp: , Rfl:  .  omeprazole (PRILOSEC) 20 MG capsule, Take 20 mg by mouth daily., Disp: , Rfl:  .  simvastatin (ZOCOR) 40 MG tablet, Take 40 mg by mouth at bedtime. , Disp: , Rfl:  .  zolpidem (AMBIEN) 10 MG  tablet, Take 5 mg by mouth at bedtime as needed. , Disp: , Rfl:  .  carvedilol (COREG) 6.25 MG tablet, Take 6.25 mg by mouth 2 (two) times daily with a meal. , Disp: , Rfl:  .  Cyanocobalamin (VITAMIN B-12) 500 MCG SUBL, Place 1 tablet under the tongue daily., Disp: , Rfl:  .  TURMERIC PO, Take by mouth daily., Disp: , Rfl:   Allergies  Allergen Reactions  . No Known Allergies     ROS   No other specific complaints in a complete review of systems (except as listed in HPI  above).  Objective  Vitals:   03/02/19 0856  BP: (!) 165/77     There is no height or weight on file to calculate BMI.  Nursing Note and Vital Signs reviewed.  Physical Exam   Constitutional: Patient appears well-developed and well-nourished. No distress.  HENT: Head: Normocephalic and atraumatic. Cardiovascular: Normal rate Pulmonary/Chest: Effort normal  Musculoskeletal: Normal range of motion,  Neurological: alert and oriented, speech normal.  Skin: No rash noted. No erythema.  Psychiatric: Patient has a normal mood and affect. behavior is normal. Judgment and thought content normal.    Assessment & Plan  1. GAD (generalized anxiety disorder) Discussed therapy, and healthy coping.  - escitalopram (LEXAPRO) 20 MG tablet; Take 1 tablet (20 mg total) by mouth daily.  Dispense: 90 tablet; Refill: 0  2. Panic attack - hydrOXYzine (VISTARIL) 25 MG capsule; Take 1 capsule (25 mg total) by mouth 2 (two) times daily as needed for anxiety.  Dispense: 30 capsule; Refill: 0  3. Hypertension Uncontrolled- likely due to anxiety, continue to monitor- take BP meds as prescribed and follow-up sooner if persistently elevated.    Follow Up Instructions:   follow up 1 month  I discussed the assessment and treatment plan with the patient. The patient was provided an opportunity to ask questions and all were answered. The patient agreed with the plan and demonstrated an understanding of the instructions.    The patient was advised to call back or seek an in-person evaluation if the symptoms worsen or if the condition fails to improve as anticipated.  I provided 22 minutes of non-face-to-face time during this encounter.   Fredderick Severance, NP

## 2019-03-10 ENCOUNTER — Other Ambulatory Visit: Payer: Self-pay | Admitting: Nurse Practitioner

## 2019-03-10 DIAGNOSIS — F410 Panic disorder [episodic paroxysmal anxiety] without agoraphobia: Secondary | ICD-10-CM

## 2019-03-22 ENCOUNTER — Ambulatory Visit (INDEPENDENT_AMBULATORY_CARE_PROVIDER_SITE_OTHER): Payer: Medicare Other | Admitting: Nurse Practitioner

## 2019-03-22 ENCOUNTER — Encounter: Payer: Self-pay | Admitting: Nurse Practitioner

## 2019-03-22 ENCOUNTER — Other Ambulatory Visit: Payer: Self-pay

## 2019-03-22 VITALS — BP 177/76 | HR 73 | Temp 98.1°F | Resp 16

## 2019-03-22 DIAGNOSIS — Z7189 Other specified counseling: Secondary | ICD-10-CM | POA: Diagnosis not present

## 2019-03-22 DIAGNOSIS — J029 Acute pharyngitis, unspecified: Secondary | ICD-10-CM | POA: Diagnosis not present

## 2019-03-22 DIAGNOSIS — I10 Essential (primary) hypertension: Secondary | ICD-10-CM

## 2019-03-22 DIAGNOSIS — R05 Cough: Secondary | ICD-10-CM

## 2019-03-22 DIAGNOSIS — R059 Cough, unspecified: Secondary | ICD-10-CM

## 2019-03-22 DIAGNOSIS — Z20822 Contact with and (suspected) exposure to covid-19: Secondary | ICD-10-CM

## 2019-03-22 MED ORDER — MAGIC MOUTHWASH W/LIDOCAINE: 5.0000 mL | Freq: Three times a day (TID) | ORAL | 0 refills | Status: DC | PRN

## 2019-03-22 NOTE — Progress Notes (Signed)
Virtual Visit via Video Note  I connected with Lydia Elliott on 03/22/19 at  9:20 AM EDT by a video enabled telemedicine application and verified that I am speaking with the correct person using two identifiers.   Staff discussed the limitations of evaluation and management by telemedicine and the availability of in person appointments. The patient expressed understanding and agreed to proceed.  Patient location: home  My location: work office Other people present: none HPI  Patient endorses sore throat that started yesterday morning but last night became increasingly painful. Is sore and puffy in the back of her throat. Endorses mild pain when she follows. Endorses hoarseness, productive cough-  Mild phlegm  Denies fevers, shortness of breath, nausea, vomiting, diarrhea, rhinorrhea, sinus pain or congestion.   Hypertension Just took her meds before calling.  PHQ2/9: Depression screen [facility] 2/9 03/22/2019 03/02/2019 01/26/2019 12/05/2017 08/25/2017  Decreased Interest 0 0 1 0 0  Down, Depressed, Hopeless 1 0 1 0 0  PHQ - 2 Score 1 0 2 0 0  Altered sleeping 0 0 3 - -  Tired, decreased energy 0 0 0 - -  Change in appetite 0 0 0 - -  Feeling bad or failure about yourself  0 0 1 - -  Trouble concentrating 0 0 0 - -  Moving slowly or fidgety/restless 0 0 1 - -  Suicidal thoughts 0 0 0 - -  PHQ-9 Score 1 0 7 - -  Difficult doing work/chores Not difficult at all Not difficult at all Somewhat difficult - -     PHQ reviewed. Negative  Patient Active Problem List   Diagnosis Date Noted  . Aortic atherosclerosis ([place]) 10/18/2016  . Nodule of right lung 10/18/2016  . GERD (gastroesophageal reflux disease) 04/24/2016  . Anxiety about health 12/23/2015  . Overweight (BMI 25.0-29.9) 12/23/2015  . Benign neoplasm of descending colon   . Impaired fasting glucose 07/03/2015  . Anemia 07/03/2015  . Takotsubo cardiomyopathy 06/18/2015  . Hypertension   . Hyperlipidemia   . Insomnia   .  Polyarthralgia   . CAD (coronary artery disease)   . Thalassemia   . Heart disease     Past Medical History:  Diagnosis Date  . Arthritis    hands, knees, toes  . CAD (coronary artery disease)   . Chronic anemia   . GERD (gastroesophageal reflux disease)   . Heart disease    has a stent  . Hyperlipidemia   . Hypertension   . Insomnia   . Microcytosis    Hgb 10.9, MCV 66 April 2015  . Nodule of right lung 10/18/2016  . Overweight (BMI 25.0-29.9) 12/23/2015  . Polyarthralgia   . Thalassemia   . Vertigo    none - 10 yrs  . Wears dentures    partial lower    Past Surgical History:  Procedure Laterality Date  . APPENDECTOMY  1967  . CARPAL TUNNEL RELEASE  2007  . CATARACT EXTRACTION  2013  . CHOLECYSTECTOMY  2000  . COLONOSCOPY  7/31//15   Dr.Wohl  . COLONOSCOPY WITH PROPOFOL N/A 12/11/2015   Procedure: COLONOSCOPY WITH PROPOFOL;  Surgeon: Lucilla Lame, MD;  Location: [place];  Service: Endoscopy;  Laterality: N/A;  DESCENDING COLON POLYP   . CORONARY ANGIOPLASTY WITH STENT PLACEMENT  2010  . ESOPHAGOGASTRODUODENOSCOPY  02/21/14   Dr.Wohl  . ESOPHAGOGASTRODUODENOSCOPY (EGD) WITH PROPOFOL N/A 12/11/2015   Procedure: ESOPHAGOGASTRODUODENOSCOPY (EGD) WITH PROPOFOL;  Surgeon: Lucilla Lame, MD;  Location: [place];  Service: Endoscopy;  Laterality: N/A;  . POLYPECTOMY N/A 12/11/2015   Procedure: POLYPECTOMY;  Surgeon: Lucilla Lame, MD;  Location: [place];  Service: Endoscopy;  Laterality: N/A;  . TONSILLECTOMY AND ADENOIDECTOMY  1969    Social History   Tobacco Use  . Smoking status: Former Smoker    Types: Cigarettes  . Smokeless tobacco: Never Used  . Tobacco comment: Social smoker   Substance Use Topics  . Alcohol use: Yes    Alcohol/week: 1.0 standard drinks    Types: 1 Shots of liquor per week    Comment: socially     Current Outpatient Medications:  .  albuterol (PROAIR HFA) 108 (90 Base) MCG/ACT inhaler, Inhale 2 puffs into the  lungs every 4 (four) hours as needed for wheezing or shortness of breath., Disp: 1 Inhaler, Rfl: 1 .  aspirin EC 81 MG tablet, Take 81 mg by mouth daily., Disp: , Rfl:  .  desoximetasone (TOPICORT) 0.25 % cream, 1(ONE) APPLICATION(S) TOPICAL 2(TWO) TIMES A DAY X 2 WEEKS UNTIL SYMPTOM IMPROVE, Disp: , Rfl: 0 .  dicyclomine (BENTYL) 20 MG tablet, Take 1 tablet (20 mg total) by mouth every 8 (eight) hours as needed for spasms (abdominal spasms)., Disp: 30 tablet, Rfl: 1 .  docusate sodium (COLACE) 100 MG capsule, Take 1 capsule (100 mg total) by mouth 2 (two) times daily as needed for mild constipation., Disp: 30 capsule, Rfl: 0 .  escitalopram (LEXAPRO) 20 MG tablet, Take 1 tablet (20 mg total) by mouth daily., Disp: 90 tablet, Rfl: 0 .  losartan (COZAAR) 25 MG tablet, Take 25 mg by mouth 2 (two) times a day. , Disp: , Rfl:  .  Multiple Vitamin (MULTIVITAMIN) tablet, Take 1 tablet by mouth daily., Disp: , Rfl:  .  omeprazole (PRILOSEC) 20 MG capsule, Take 20 mg by mouth daily., Disp: , Rfl:  .  simvastatin (ZOCOR) 40 MG tablet, Take 40 mg by mouth at bedtime. , Disp: , Rfl:  .  zolpidem (AMBIEN) 10 MG tablet, Take 5 mg by mouth at bedtime as needed. , Disp: , Rfl:  .  carvedilol (COREG) 6.25 MG tablet, Take 6.25 mg by mouth 2 (two) times daily with a meal. , Disp: , Rfl:  .  Cyanocobalamin (VITAMIN B-12) 500 MCG SUBL, Place 1 tablet under the tongue daily., Disp: , Rfl:  .  hydrOXYzine (VISTARIL) 25 MG capsule, TAKE 1 CAPSULE (25 MG TOTAL) BY MOUTH 2 (TWO) TIMES DAILY AS NEEDED FOR ANXIETY. (Patient not taking: Reported on 03/22/2019), Disp: 180 capsule, Rfl: 0 .  loperamide (IMODIUM) 2 MG capsule, Take 1 capsule (2 mg total) by mouth 2 (two) times daily between meals as needed for diarrhea or loose stools (45 minutes before meal if having significant stress). (Patient not taking: Reported on 03/22/2019), Disp: 30 capsule, Rfl: 0 .  magic mouthwash w/lidocaine SOLN, Take 5 mLs by mouth 3 (three) times  daily as needed for mouth pain., Disp: 100 mL, Rfl: 0 .  TURMERIC PO, Take by mouth daily., Disp: , Rfl:   Allergies  Allergen Reactions  . No Known Allergies     ROS   No other specific complaints in a complete review of systems (except as listed in HPI above).  Objective  Vitals:   03/22/19 0832  BP: (!) 177/76  Pulse: 73  Resp: 16  Temp: 98.1 F (36.7 C)  SpO2: 98%     There is no height or weight on file to calculate BMI.  Nursing Note  and Vital Signs reviewed.  Physical Exam   Constitutional: Patient appears well-developed and well-nourished. No distress.  HENT: Head: Normocephalic and atraumatic. Cardiovascular: Normal rate Pulmonary/Chest: Effort normal  Musculoskeletal: Normal range of motion,  Neurological: alert and oriented, speech normal.  Skin: No rash noted. No erythema.  Psychiatric: Patient has a normal mood and affect. behavior is normal. Judgment and thought content normal.    Assessment & Plan  1. Sore throat - Novel Coronavirus, NAA ([facility]) - magic mouthwash w/lidocaine SOLN; Take 5 mLs by mouth 3 (three) times daily as needed for mouth pain.  Dispense: 100 mL; Refill: 0  2. Cough - Novel Coronavirus, NAA ([facility])  3. Educated About Covid-19 Virus Infection Recommended stay at home and self-isolate, testing for husband ordered. Discussed treatment and ROC or ER precautions  4. Uncontrolled hypertension States its due to anxiety and just taking medications- states she follows up with cardiology for this, will routinely monitor and follow-up with cards. Discussed diet, deep breathing and adjusting meds- she states will discuss with cards.     Follow Up Instructions:  PRN   I discussed the assessment and treatment plan with the patient. The patient was provided an opportunity to ask questions and all were answered. The patient agreed with the plan and demonstrated an understanding of the instructions.   The patient was advised to  call back or seek an in-person evaluation if the symptoms worsen or if the condition fails to improve as anticipated.  I provided 18 minutes of non-face-to-face time during this encounter.   Fredderick Severance, NP

## 2019-03-22 NOTE — Patient Instructions (Signed)
You likely have a viral upper respiratory infection (URI). Antibiotics will not reduce the number of days you are ill or prevent you from getting bacterial rhinosinusitis. A URI can take up to 14 days to resolve, but typically last between 7-11 days. Your body is so smart and strong that it will be fighting this illness off for you but it is important that you drink plenty of fluids, rest. Cover your nose/mouth when you cough or sneeze and wash your hands well and often. Here are some helpful things you can use or pick up over the counter from the pharmacy to help with your symptoms:   For Fever/Pain: Acetaminophen every 6 hours as needed (maximum of 3000mg  a day). If you are still uncomfortable you can add ibuprofen OR naproxen  For coughing: try dextromethorphan for a cough suppressant, and/or a cool mist humidifier, lozenges  For sore throat: saline gargles, honey herbal tea, lozenges, throat spray  To dry out your nose: try an antihistamine like loratadine (non-sedating) or diphenhydramine (sedating) or others To relieve a stuffy nose: try a decongestant like flonase or try the neti pot To make blowing your nose easier and relieve chest congestion: guaifenesin 400mg  every 4-6 hours of guaifenesin ER [phone] mg every 12 hours. Do not take more than 2,400mg  a day.

## 2019-03-23 ENCOUNTER — Ambulatory Visit: Payer: Medicare Other

## 2019-03-23 LAB — NOVEL CORONAVIRUS, NAA: SARS-CoV-2, NAA: NOT DETECTED

## 2019-03-24 ENCOUNTER — Telehealth: Payer: Self-pay | Admitting: *Deleted

## 2019-03-24 NOTE — Telephone Encounter (Signed)
Patient cancelled CT scan that was scheduled for 03-23-19. She states she had COVID test which was negative but still not better. The patient is aware she needs to call the Scheduling Department at [phone] to get this rescheduled when she is feeling better and call the office back to reschedule follow up appointment with Dr. Genevive Bi. She verbalizes understanding.   Note routed to Dr. Genevive Bi.

## 2019-03-26 ENCOUNTER — Telehealth: Payer: Medicare Other | Admitting: Cardiothoracic Surgery

## 2019-03-30 ENCOUNTER — Ambulatory Visit: Payer: Medicare Other | Admitting: Nurse Practitioner

## 2019-03-31 ENCOUNTER — Ambulatory Visit: Payer: Medicare Other | Admitting: Nurse Practitioner

## 2019-04-08 DIAGNOSIS — F411 Generalized anxiety disorder: Secondary | ICD-10-CM | POA: Diagnosis not present

## 2019-04-08 DIAGNOSIS — I5181 Takotsubo syndrome: Secondary | ICD-10-CM | POA: Diagnosis not present

## 2019-04-08 DIAGNOSIS — I10 Essential (primary) hypertension: Secondary | ICD-10-CM | POA: Diagnosis not present

## 2019-04-08 DIAGNOSIS — F5104 Psychophysiologic insomnia: Secondary | ICD-10-CM | POA: Diagnosis not present

## 2019-04-08 DIAGNOSIS — E782 Mixed hyperlipidemia: Secondary | ICD-10-CM | POA: Diagnosis not present

## 2019-04-08 DIAGNOSIS — I2510 Atherosclerotic heart disease of native coronary artery without angina pectoris: Secondary | ICD-10-CM | POA: Diagnosis not present

## 2019-04-28 DIAGNOSIS — I2510 Atherosclerotic heart disease of native coronary artery without angina pectoris: Secondary | ICD-10-CM | POA: Diagnosis not present

## 2019-04-28 DIAGNOSIS — F5104 Psychophysiologic insomnia: Secondary | ICD-10-CM | POA: Diagnosis not present

## 2019-04-28 DIAGNOSIS — E782 Mixed hyperlipidemia: Secondary | ICD-10-CM | POA: Diagnosis not present

## 2019-04-28 DIAGNOSIS — F411 Generalized anxiety disorder: Secondary | ICD-10-CM | POA: Diagnosis not present

## 2019-04-28 DIAGNOSIS — I10 Essential (primary) hypertension: Secondary | ICD-10-CM | POA: Diagnosis not present

## 2019-04-28 DIAGNOSIS — Z0000 Encounter for general adult medical examination without abnormal findings: Secondary | ICD-10-CM | POA: Diagnosis not present

## 2019-04-29 DIAGNOSIS — Z23 Encounter for immunization: Secondary | ICD-10-CM | POA: Diagnosis not present

## 2019-05-11 DIAGNOSIS — H26493 Other secondary cataract, bilateral: Secondary | ICD-10-CM | POA: Diagnosis not present

## 2019-05-19 DIAGNOSIS — M9901 Segmental and somatic dysfunction of cervical region: Secondary | ICD-10-CM | POA: Diagnosis not present

## 2019-05-19 DIAGNOSIS — M5414 Radiculopathy, thoracic region: Secondary | ICD-10-CM | POA: Diagnosis not present

## 2019-05-19 DIAGNOSIS — M9902 Segmental and somatic dysfunction of thoracic region: Secondary | ICD-10-CM | POA: Diagnosis not present

## 2019-05-19 DIAGNOSIS — M62830 Muscle spasm of back: Secondary | ICD-10-CM | POA: Diagnosis not present

## 2019-05-21 DIAGNOSIS — M62830 Muscle spasm of back: Secondary | ICD-10-CM | POA: Diagnosis not present

## 2019-05-21 DIAGNOSIS — M5414 Radiculopathy, thoracic region: Secondary | ICD-10-CM | POA: Diagnosis not present

## 2019-05-21 DIAGNOSIS — M9901 Segmental and somatic dysfunction of cervical region: Secondary | ICD-10-CM | POA: Diagnosis not present

## 2019-05-21 DIAGNOSIS — M9902 Segmental and somatic dysfunction of thoracic region: Secondary | ICD-10-CM | POA: Diagnosis not present

## 2019-05-24 ENCOUNTER — Other Ambulatory Visit: Payer: Self-pay

## 2019-05-24 DIAGNOSIS — M9901 Segmental and somatic dysfunction of cervical region: Secondary | ICD-10-CM | POA: Diagnosis not present

## 2019-05-24 DIAGNOSIS — M9902 Segmental and somatic dysfunction of thoracic region: Secondary | ICD-10-CM | POA: Diagnosis not present

## 2019-05-24 DIAGNOSIS — M5414 Radiculopathy, thoracic region: Secondary | ICD-10-CM | POA: Diagnosis not present

## 2019-05-24 DIAGNOSIS — M62830 Muscle spasm of back: Secondary | ICD-10-CM | POA: Diagnosis not present

## 2019-05-24 DIAGNOSIS — F411 Generalized anxiety disorder: Secondary | ICD-10-CM

## 2019-05-24 MED ORDER — ESCITALOPRAM OXALATE 20 MG PO TABS: 20.0000 mg | ORAL_TABLET | Freq: Every day | ORAL | 0 refills | Status: DC

## 2019-05-26 DIAGNOSIS — M62830 Muscle spasm of back: Secondary | ICD-10-CM | POA: Diagnosis not present

## 2019-05-26 DIAGNOSIS — M5414 Radiculopathy, thoracic region: Secondary | ICD-10-CM | POA: Diagnosis not present

## 2019-05-26 DIAGNOSIS — M9902 Segmental and somatic dysfunction of thoracic region: Secondary | ICD-10-CM | POA: Diagnosis not present

## 2019-05-26 DIAGNOSIS — M9901 Segmental and somatic dysfunction of cervical region: Secondary | ICD-10-CM | POA: Diagnosis not present

## 2019-06-04 DIAGNOSIS — M170 Bilateral primary osteoarthritis of knee: Secondary | ICD-10-CM | POA: Diagnosis not present

## 2019-06-07 DIAGNOSIS — M9902 Segmental and somatic dysfunction of thoracic region: Secondary | ICD-10-CM | POA: Diagnosis not present

## 2019-06-07 DIAGNOSIS — M62830 Muscle spasm of back: Secondary | ICD-10-CM | POA: Diagnosis not present

## 2019-06-07 DIAGNOSIS — M9901 Segmental and somatic dysfunction of cervical region: Secondary | ICD-10-CM | POA: Diagnosis not present

## 2019-06-07 DIAGNOSIS — M5414 Radiculopathy, thoracic region: Secondary | ICD-10-CM | POA: Diagnosis not present

## 2019-06-14 DIAGNOSIS — M9902 Segmental and somatic dysfunction of thoracic region: Secondary | ICD-10-CM | POA: Diagnosis not present

## 2019-06-14 DIAGNOSIS — M9901 Segmental and somatic dysfunction of cervical region: Secondary | ICD-10-CM | POA: Diagnosis not present

## 2019-06-14 DIAGNOSIS — M5414 Radiculopathy, thoracic region: Secondary | ICD-10-CM | POA: Diagnosis not present

## 2019-06-14 DIAGNOSIS — M62830 Muscle spasm of back: Secondary | ICD-10-CM | POA: Diagnosis not present

## 2019-06-15 DIAGNOSIS — K581 Irritable bowel syndrome with constipation: Secondary | ICD-10-CM | POA: Diagnosis not present

## 2019-06-15 DIAGNOSIS — H9190 Unspecified hearing loss, unspecified ear: Secondary | ICD-10-CM | POA: Diagnosis not present

## 2019-06-15 DIAGNOSIS — E785 Hyperlipidemia, unspecified: Secondary | ICD-10-CM | POA: Diagnosis not present

## 2019-06-15 DIAGNOSIS — G4700 Insomnia, unspecified: Secondary | ICD-10-CM | POA: Diagnosis not present

## 2019-06-15 DIAGNOSIS — H9202 Otalgia, left ear: Secondary | ICD-10-CM | POA: Diagnosis not present

## 2019-06-15 DIAGNOSIS — I10 Essential (primary) hypertension: Secondary | ICD-10-CM | POA: Diagnosis not present

## 2019-06-15 DIAGNOSIS — F411 Generalized anxiety disorder: Secondary | ICD-10-CM | POA: Diagnosis not present

## 2019-06-16 DIAGNOSIS — M9902 Segmental and somatic dysfunction of thoracic region: Secondary | ICD-10-CM | POA: Diagnosis not present

## 2019-06-16 DIAGNOSIS — M62830 Muscle spasm of back: Secondary | ICD-10-CM | POA: Diagnosis not present

## 2019-06-16 DIAGNOSIS — M5414 Radiculopathy, thoracic region: Secondary | ICD-10-CM | POA: Diagnosis not present

## 2019-06-16 DIAGNOSIS — M9901 Segmental and somatic dysfunction of cervical region: Secondary | ICD-10-CM | POA: Diagnosis not present

## 2019-06-23 DIAGNOSIS — M9902 Segmental and somatic dysfunction of thoracic region: Secondary | ICD-10-CM | POA: Diagnosis not present

## 2019-06-23 DIAGNOSIS — M62830 Muscle spasm of back: Secondary | ICD-10-CM | POA: Diagnosis not present

## 2019-06-23 DIAGNOSIS — M5414 Radiculopathy, thoracic region: Secondary | ICD-10-CM | POA: Diagnosis not present

## 2019-06-23 DIAGNOSIS — M9901 Segmental and somatic dysfunction of cervical region: Secondary | ICD-10-CM | POA: Diagnosis not present

## 2019-07-22 DIAGNOSIS — Z20828 Contact with and (suspected) exposure to other viral communicable diseases: Secondary | ICD-10-CM | POA: Diagnosis not present

## 2019-08-16 ENCOUNTER — Other Ambulatory Visit: Payer: Self-pay | Admitting: Family Medicine

## 2019-08-16 DIAGNOSIS — F411 Generalized anxiety disorder: Secondary | ICD-10-CM

## 2019-09-15 DIAGNOSIS — I10 Essential (primary) hypertension: Secondary | ICD-10-CM | POA: Diagnosis not present

## 2019-09-15 DIAGNOSIS — I2510 Atherosclerotic heart disease of native coronary artery without angina pectoris: Secondary | ICD-10-CM | POA: Diagnosis not present

## 2019-09-15 DIAGNOSIS — E782 Mixed hyperlipidemia: Secondary | ICD-10-CM | POA: Diagnosis not present

## 2019-09-15 DIAGNOSIS — I5181 Takotsubo syndrome: Secondary | ICD-10-CM | POA: Diagnosis not present

## 2019-10-25 DIAGNOSIS — M170 Bilateral primary osteoarthritis of knee: Secondary | ICD-10-CM | POA: Diagnosis not present

## 2019-11-01 ENCOUNTER — Telehealth: Payer: Self-pay

## 2019-11-01 NOTE — Telephone Encounter (Signed)
Message left for the patient to call back. She is past due for her CT Chest scan and follow up with Dr Genevive Bi. She had canceled due to being sick.

## 2019-11-09 ENCOUNTER — Other Ambulatory Visit: Payer: Self-pay | Admitting: Family Medicine

## 2019-11-09 DIAGNOSIS — F411 Generalized anxiety disorder: Secondary | ICD-10-CM

## 2019-11-09 NOTE — Telephone Encounter (Signed)
Refill request for general medication.  Last office visit:06/15/2019  No follow-ups on file.

## 2020-01-18 DIAGNOSIS — K581 Irritable bowel syndrome with constipation: Secondary | ICD-10-CM | POA: Diagnosis not present

## 2020-01-18 DIAGNOSIS — I10 Essential (primary) hypertension: Secondary | ICD-10-CM | POA: Diagnosis not present

## 2020-01-18 DIAGNOSIS — I2510 Atherosclerotic heart disease of native coronary artery without angina pectoris: Secondary | ICD-10-CM | POA: Diagnosis not present

## 2020-01-18 DIAGNOSIS — R252 Cramp and spasm: Secondary | ICD-10-CM | POA: Diagnosis not present

## 2020-01-21 ENCOUNTER — Other Ambulatory Visit: Payer: Self-pay

## 2020-01-21 DIAGNOSIS — R911 Solitary pulmonary nodule: Secondary | ICD-10-CM

## 2020-02-09 ENCOUNTER — Ambulatory Visit: Payer: PPO

## 2020-02-18 DIAGNOSIS — I10 Essential (primary) hypertension: Secondary | ICD-10-CM | POA: Diagnosis not present

## 2020-02-18 DIAGNOSIS — I2510 Atherosclerotic heart disease of native coronary artery without angina pectoris: Secondary | ICD-10-CM | POA: Diagnosis not present

## 2020-02-18 DIAGNOSIS — I5181 Takotsubo syndrome: Secondary | ICD-10-CM | POA: Diagnosis not present

## 2020-02-18 DIAGNOSIS — E782 Mixed hyperlipidemia: Secondary | ICD-10-CM | POA: Diagnosis not present

## 2020-02-21 ENCOUNTER — Ambulatory Visit: Payer: Self-pay | Admitting: Cardiothoracic Surgery

## 2020-03-20 ENCOUNTER — Other Ambulatory Visit: Payer: Self-pay

## 2020-03-20 ENCOUNTER — Ambulatory Visit
Admission: RE | Admit: 2020-03-20 | Discharge: 2020-03-20 | Disposition: A | Discharge status: Home / Self Care | Payer: PPO | Source: Ambulatory Visit | Attending: Cardiothoracic Surgery | Admitting: Cardiothoracic Surgery

## 2020-03-20 DIAGNOSIS — J479 Bronchiectasis, uncomplicated: Secondary | ICD-10-CM | POA: Diagnosis not present

## 2020-03-20 DIAGNOSIS — I2510 Atherosclerotic heart disease of native coronary artery without angina pectoris: Secondary | ICD-10-CM | POA: Diagnosis not present

## 2020-03-20 DIAGNOSIS — R911 Solitary pulmonary nodule: Secondary | ICD-10-CM | POA: Insufficient documentation

## 2020-03-20 DIAGNOSIS — I700 Atherosclerosis of aorta: Secondary | ICD-10-CM | POA: Diagnosis not present

## 2020-03-24 ENCOUNTER — Other Ambulatory Visit: Payer: Self-pay

## 2020-03-24 ENCOUNTER — Encounter: Payer: Self-pay | Admitting: Cardiothoracic Surgery

## 2020-03-24 ENCOUNTER — Ambulatory Visit (INDEPENDENT_AMBULATORY_CARE_PROVIDER_SITE_OTHER): Payer: PPO | Admitting: Cardiothoracic Surgery

## 2020-03-24 VITALS — BP 187/83 | HR 88 | Temp 98.5°F | Resp 12 | Ht 63.0 in | Wt 142.6 lb

## 2020-03-24 DIAGNOSIS — J849 Interstitial pulmonary disease, unspecified: Secondary | ICD-10-CM | POA: Diagnosis not present

## 2020-03-24 DIAGNOSIS — R911 Solitary pulmonary nodule: Secondary | ICD-10-CM

## 2020-03-24 NOTE — Progress Notes (Signed)
Lydia Elliott Follow Up Note  Patient ID: Lydia Elliott, female   DOB: 06/08/1951, 69 y.o.   MRN: [id number]  HISTORY: Seen 2 years ago for a sub pleural nodule.  Was to have a CT last year but missed due to Covid.  Had another CT this week in followup and is here to review.  Not short of breath.  No cough.  No new symptoms.  Anxious since she read the CT report without speaking to me in My Chart.  Non smoker.    Vitals:   03/24/20 0811  BP: (!) 187/83  Pulse: 88  Resp: 12  Temp: 98.5 F (36.9 C)  SpO2: 97%     EXAM:  Resp: Lungs are clear bilaterally.  No respiratory distress, normal effort. Heart:  Regular without murmurs Abd:  Abdomen is soft, non distended and non tender. No masses are palpable.  There is no rebound and no guarding.  Neurological: Alert and oriented to person, place, and time. Coordination normal.  Skin: Skin is warm and dry. No rash noted. No diaphoretic. No erythema. No pallor.  Psychiatric: Normal mood and affect. Normal behavior. Judgment and thought content normal.      ASSESSMENT: Reviewed the CT with patient and husband.  There is some subtle findings of an interstitial process in the lower lobes bilaterally.  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 Chase Caller in [facility] for ILD.  Will see me PRN.    Nestor Lewandowsky, MD

## 2020-03-24 NOTE — Patient Instructions (Addendum)
We have placed a referral to Dr Chase Caller. Their office will contact you within 7-10 days to schedule an appointment. If you do not hear from their office please contact us back.   Follow up as needed with Dr Genevive Bi. Call the office if you have any questions or concerns.

## 2020-04-04 DIAGNOSIS — I5181 Takotsubo syndrome: Secondary | ICD-10-CM | POA: Diagnosis not present

## 2020-04-04 DIAGNOSIS — I2510 Atherosclerotic heart disease of native coronary artery without angina pectoris: Secondary | ICD-10-CM | POA: Diagnosis not present

## 2020-05-15 DIAGNOSIS — I10 Essential (primary) hypertension: Secondary | ICD-10-CM | POA: Diagnosis not present

## 2020-05-15 DIAGNOSIS — M25649 Stiffness of unspecified hand, not elsewhere classified: Secondary | ICD-10-CM | POA: Diagnosis not present

## 2020-05-15 DIAGNOSIS — M791 Myalgia, unspecified site: Secondary | ICD-10-CM | POA: Diagnosis not present

## 2020-05-19 DIAGNOSIS — M170 Bilateral primary osteoarthritis of knee: Secondary | ICD-10-CM | POA: Diagnosis not present

## 2020-06-09 ENCOUNTER — Encounter: Payer: Self-pay | Admitting: Internal Medicine

## 2020-06-09 ENCOUNTER — Other Ambulatory Visit: Payer: Self-pay

## 2020-06-09 ENCOUNTER — Ambulatory Visit: Payer: PPO | Admitting: Internal Medicine

## 2020-06-09 VITALS — BP 156/72 | HR 71 | Temp 97.3°F | Ht 63.0 in | Wt 145.4 lb

## 2020-06-09 DIAGNOSIS — R918 Other nonspecific abnormal finding of lung field: Secondary | ICD-10-CM

## 2020-06-09 DIAGNOSIS — R9389 Abnormal findings on diagnostic imaging of other specified body structures: Secondary | ICD-10-CM

## 2020-06-09 DIAGNOSIS — Z8269 Family history of other diseases of the musculoskeletal system and connective tissue: Secondary | ICD-10-CM | POA: Diagnosis not present

## 2020-06-09 DIAGNOSIS — Z7729 Contact with and (suspected ) exposure to other hazardous substances: Secondary | ICD-10-CM | POA: Diagnosis not present

## 2020-06-09 NOTE — Progress Notes (Signed)
OV 06/09/2020  Subjective:  Patient ID: Lydia Elliott, female , DOB: August 02, 1951 , age 69 y.o. , MRN: [id number] , ADDRESS: [address] [place] [facility] [id number] PCP Leonel Ramsay, MD Patient Care Team: Leonel Ramsay, MD as PCP - General (Infectious Diseases) Lucilla Lame, MD as Consulting Physician (Gastroenterology) Jannet Mantis, MD (Dermatology) Teodoro Spray, MD as Consulting Physician (Cardiology)  This Provider for this visit: Treatment Team:  Attending Provider: Brand Males, MD    06/09/2020 -   Chief Complaint  Patient presents with  . Consult    ILD - denies sob, cough, or wheezing.  Reviewed the CT scan and abnormal CT test of   HPI Lydia Elliott 69 y.o. -  She had a CT scan of the chest without contrast in October 2018 that I personally visualized for nodules and not sure she has ILD but then again it is not a high-resolution CT chest.  This was followed up by CT scan of the chest without contrast again in April 2019 where the perifissural nodules are unchanged.  Again I personally visualized the scan no clear evidence of ILD.  Then she had another scan in August 2021 I personally visualized this definite suggestion of groundglass opacities in the bilateral subpleural basis but again does not a high-resolution CT chest and there are no prone images.  This time the radiologist felt looking back in April 2019 she had similar findings although I personally am not sure.  The most clear-cut evidence of this is in August 2021 which is the most recent CT chest that we have.  Patient confirms this history.  She herself who I am meeting for the first time tells me that she does not have any undue shortness of breath.  For years she has done 2 times a week of yoga and she walks 20-30 minutes 3 times a week and is on a golf [place] with this undulating slopes.  She does get winded when she climbs uphill but this has not changed.  There is no cough.   She is dealing with arthritis and she has a rheumatology appointment pending.  She has a daughter with connective tissue disease.  There is no wheezing.  No nausea no vomiting no diarrhea.    [place] Integrated Comprehensive ILD Questionnaire  Symptoms:   SYMPTOM SCALE - ILD 06/09/2020   O2 use ra  Shortness of Breath 0 -> 5 scale with 5 being worst (score 6 If unable to do)  At rest 0  Simple tasks - showers, clothes change, eating, shaving 0  Household (dishes, doing bed, laundry) 00  Shopping 0  Walking level at own pace 0  Walking up Stairs 0 (yes for hills)  Total (30-36) Dyspnea Score 0  How bad is your cough? 0  How bad is your fatigue 0  How bad is nausea 0  How bad is vomiting?  00  How bad is diarrhea? 0  How bad is anxiety? 00  How bad is depression 0        Past Medical History :   has a past medical history of Arthritis, CAD (coronary artery disease), Chronic anemia, GERD (gastroesophageal reflux disease), Heart disease, Hyperlipidemia, Hypertension, Insomnia, Microcytosis, Nodule of right lung (10/18/2016), Overweight (BMI 25.0-29.9) (12/23/2015), Polyarthralgia, Thalassemia, Vertigo, and Wears dentures.   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  She gets steroid shots in her knees.  Easy bruising despite being only on baby aspirin.  No Raynaud's.  No snoring no rash no ulcers.   FAMILY HISTORY of LUNG DISEASE: Brother has COPD.  Daughter has connective tissue disease.  ANother one with psoriatic arthritis   EXPOSURE HISTORY: Does not do cigarettes or tobacco or cocaine or intravenous drug use   HOME and HOBBY DETAILS : [place] for the last 7 years age of the home is 8 years.  No dampness.  No mold no mildew.  No humidifier use no CPAP use no nebulizer use no Jacuzzi use.  No misting Fountain.  No pet birds or  parakeets.  No pet gerbils.  Does have a feather pillow . X 7 7ear. She borught some more recently. SHe has down jackets x few to several years but all < 8 years .   No mold in the ducts.  No mildew anywhere.  No gardening habits.  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  She is now retired.  Extensive history for organic and inorganic antigens is negative   PULMONARY TOXICITY HISTORY (27 items): Negative especially no amiodarone or nitrofurantoin.     Simple office walk 185 feet x  3 laps goal with forehead probe 06/09/2020   O2 used ra  Number laps completed 3  Comments about pace Steady, fast  Resting Pulse Ox/HR 100% and 71/min  Final Pulse Ox/HR 100% and 92/min  Desaturated </= 88% no  Desaturated <= 3% points no  Got Tachycardic >/= 90/min yes  Symptoms at end of test None  Miscellaneous comments x    Results for ORRA, NOLDE (MRN [id number]) as of 06/09/2020 09:07  Ref. Range 08/25/2017 12:16  Creatinine Latest Ref Range: 0.50 - 0.99 mg/dL 0.77     ROS - per HPI     has a past medical history of Arthritis, CAD (coronary artery disease), Chronic anemia, GERD (gastroesophageal reflux disease), Heart disease, Hyperlipidemia, Hypertension, Insomnia, Microcytosis, Nodule of right lung (10/18/2016), Overweight (BMI 25.0-29.9) (12/23/2015), Polyarthralgia, Thalassemia, Vertigo, and Wears dentures.   reports that she quit smoking about 49 years ago. Her smoking use included cigarettes. She has never used smokeless tobacco.  Past Surgical History:  Procedure Laterality Date  . APPENDECTOMY  1967  . CARPAL TUNNEL RELEASE  2007  . CATARACT EXTRACTION  2013  . CHOLECYSTECTOMY  2000  . COLONOSCOPY  7/31//15   Dr.Wohl  . COLONOSCOPY WITH PROPOFOL N/A 12/11/2015   Procedure: COLONOSCOPY WITH PROPOFOL;  Surgeon: Lucilla Lame, MD;  Location: [facility];   Service: Endoscopy;  Laterality: N/A;  DESCENDING COLON POLYP   . CORONARY ANGIOPLASTY WITH STENT PLACEMENT  2010  . ESOPHAGOGASTRODUODENOSCOPY  02/21/14   Dr.Wohl  . ESOPHAGOGASTRODUODENOSCOPY (EGD) WITH PROPOFOL N/A 12/11/2015   Procedure: ESOPHAGOGASTRODUODENOSCOPY (EGD) WITH PROPOFOL;  Surgeon: Lucilla Lame, MD;  Location: [facility];  Service: Endoscopy;  Laterality: N/A;  . POLYPECTOMY N/A 12/11/2015   Procedure: POLYPECTOMY;  Surgeon: Lucilla Lame, MD;  Location: [facility];  Service: Endoscopy;  Laterality: N/A;  . TONSILLECTOMY AND ADENOIDECTOMY  1969    Allergies  Allergen Reactions  . No Known Allergies   . Rosuvastatin     Other reaction(s): Muscle Pain    Immunization History  Administered Date(s) Administered  . Influenza, High Dose Seasonal PF 04/22/2016, 05/09/2017, 04/20/2018, 04/29/2019  .  Influenza,inj,quad, With Preservative 04/12/2020  . Influenza-Unspecified 06/02/2015, 05/12/2017, 04/20/2018  . PFIZER SARS-COV-2 Vaccination 09/20/2019, 10/11/2019  . Pneumococcal Conjugate-13 09/18/2016  . Pneumococcal Polysaccharide-23 09/11/2017  . Tdap 02/08/2014  . Zoster Recombinat (Shingrix) 04/29/2019, 12/09/2019    Family History  Problem Relation Age of Onset  . Cirrhosis Mother   . Hepatitis Mother   . Thalassemia Mother   . Diabetes Mother   . Heart disease Father   . Diabetes Father   . Hyperlipidemia Father   . Hypertension Father   . Stroke Father   . Cancer Sister        lung  . Diabetes Sister   . Stroke Sister        during a surgery  . Diabetes Brother   . Heart disease Brother   . Hypertension Brother   . Stroke Brother   . Thalassemia Maternal Grandmother   . COPD Neg Hx      Current Outpatient Medications:  .  aspirin EC 81 MG tablet, Take 81 mg by mouth daily., Disp: , Rfl:  .  busPIRone (BUSPAR) 5 MG tablet, Take 5 mg by mouth 3 (three) times daily., Disp: , Rfl:  .  escitalopram (LEXAPRO) 20 MG tablet, TAKE 1 TABLET BY  MOUTH EVERY DAY, Disp: 90 tablet, Rfl: 3 .  losartan (COZAAR) 25 MG tablet, Take 25 mg by mouth 2 (two) times a day. , Disp: , Rfl:  .  magnesium oxide (MAG-OX) 400 MG tablet, Take by mouth., Disp: , Rfl:  .  Multiple Vitamin (MULTIVITAMIN) tablet, Take 1 tablet by mouth daily., Disp: , Rfl:  .  omeprazole (PRILOSEC) 20 MG capsule, Take 20 mg by mouth daily., Disp: , Rfl:  .  zolpidem (AMBIEN) 10 MG tablet, Take 5 mg by mouth at bedtime as needed. , Disp: , Rfl:  .  ALPRAZolam (XANAX) 0.25 MG tablet, alprazolam 0.25 mg tablet (Patient not taking: Reported on 06/09/2020), Disp: , Rfl:  .  carvedilol (COREG) 6.25 MG tablet, Take 6.25 mg by mouth 2 (two) times daily with a meal. , Disp: , Rfl:  .  simvastatin (ZOCOR) 40 MG tablet, Take 40 mg by mouth at bedtime.  (Patient not taking: Reported on 06/09/2020), Disp: , Rfl:  .  TURMERIC PO, Take by mouth daily. (Patient not taking: Reported on 06/09/2020), Disp: , Rfl:       Objective:   Vitals:   06/09/20 0901  BP: (!) 156/72  Pulse: 71  Temp: (!) 97.3 F (36.3 C)  TempSrc: Temporal  SpO2: 100%  Weight: 145 lb 6.4 oz (66 kg)  Height: '5\' 3"'  (1.6 m)    Estimated body mass index is 25.76 kg/m as calculated from the following:   Height as of this encounter: '5\' 3"'  (1.6 m).   Weight as of this encounter: 145 lb 6.4 oz (66 kg).  '@WEIGHTCHANGE' @  [employer]   06/09/20 0901  Weight: 145 lb 6.4 oz (66 kg)     Physical Exam  General Appearance:    Alert, cooperative, no distress, appears stated age - yes , Deconditioned looking - no , OBESE  - no, Sitting on Wheelchair -  no  Head:    Normocephalic, without obvious abnormality, atraumatic  Eyes:    PERRL, conjunctiva/corneas clear,  Ears:    Normal TM's and external ear canals, both ears  Nose:   Nares normal, septum midline, mucosa normal, no drainage    or sinus tenderness. OXYGEN ON  - no . Patient is @  no   Throat:   Lips, mucosa, and tongue normal; teeth and gums normal.  Cyanosis on lips - no  Neck:   Supple, symmetrical, trachea midline, no adenopathy;    thyroid:  no enlargement/tenderness/nodules; no carotid   bruit or JVD  Back:     Symmetric, no curvature, ROM normal, no CVA tenderness  Lungs:     Distress - no , Wheeze no, Barrell Chest - no, Purse lip breathing - no, Crackles - no   Chest Wall:    No tenderness or deformity.    Heart:    Regular rate and rhythm, S1 and S2 normal, no rub   or gallop, Murmur - no  Breast Exam:    NOT DONE  Abdomen:     Soft, non-tender, bowel sounds active all four quadrants,    no masses, no organomegaly. Visceral obesity - no  Genitalia:   NOT DONE  Rectal:   NOT DONE  Extremities:   Extremities - normal, Has Cane - no, Clubbing - no, Edema - no  Pulses:   2+ and symmetric all extremities  Skin:   Stigmata of Connective Tissue Disease - no  But has easy bruusing in forearms  Lymph nodes:   Cervical, supraclavicular, and axillary nodes normal  Psychiatric:  Neurologic:   Pleasant - yes, Anxious - no, Flat affect - no  CAm-ICU - neg, Alert and Oriented x 3 - yes, Moves all 4s - yes, Speech - normal, Cognition - intact         Assessment:       ICD-10-CM   1. Abnormal CT of the chest  R93.89 Aldolase    ANA    ANCA TITERS    Anti-DNA antibody, double-stranded    Antimyeloperoxidase (MPO) Abs    Anti-Scleroderma Antibody    CK Total (and CKMB)    CT Chest High Resolution    Cyclic citrul peptide antibody, IgG (QUEST)    Hypersensitivity Pneumonitis    Jo-1 antibody-IgG    Mpo/pr-3 (anca) antibodies    Pulmonary Function Test [facility] Only    Rheumatoid Factor    Sedimentation rate    Sjogren's syndrome antibods(ssa + ssb)  2. Ground glass opacity present on imaging of lung  R91.8   3. Family history of connective tissue disease  Z82.69   4. Long-term exposure involving bird droppings  Z77.29     She may not have ILD and this can all just be supine position related basal atelectasis but then she is not  that obese.  She gives a history of feather  pillow exposure and therefore with the onset of this in the last year or 2 this could easily be hypersensitive pneumonitis if she indeed has ILD.  The first thing is establish if she has ILD therefore we will get a full pulmonary function test and high-resolution CT chest supine and prone with inspiratory next Tory images and also serology work-up with hypersensitive pneumonitis panel and then decide  She is agreeable with the plan     Plan:     Patient Instructions     ICD-10-CM   1. Abnormal CT of the chest  R93.89 Aldolase    ANA    ANCA TITERS    Anti-DNA antibody, double-stranded    Antimyeloperoxidase (MPO) Abs    Anti-Scleroderma Antibody    CK Total (and CKMB)    CT Chest High Resolution    Cyclic citrul peptide antibody, IgG (QUEST)    Hypersensitivity Pneumonitis  Jo-1 antibody-IgG    Mpo/pr-3 (anca) antibodies    Pulmonary Function Test [facility] Only    Rheumatoid Factor    Sedimentation rate    Sjogren's syndrome antibods(ssa + ssb)  2. Ground glass opacity present on imaging of lung  R91.8   3. Family history of connective tissue disease  Z82.69   4. Long-term exposure involving bird droppings  Z77.29      - I am concerned you MAY  have Interstitial Lung Disease (ILD). Unclear at this point  -  There are MANY varieties of this - To narrow down possibilities and assess severity please do the following tests  - do full PFT  - - do High Resolution CT chest wo contrast - supine and prone, inspiratory and expiratory images (only Dr Rosario Jacks or Dr Weber Cooks or Dr Polly Cobia or Dr Laqueta Carina to read)  - do autoimmune panel: Serum: ESR, ANA, DS-DNA, RF, anti-CCP, ssA, ssB, scl-70, ANCA, MPO and PR-3 antibodies, Total CK,  Aldolase,  JO-1, Hypersensitivity Pneumonitis Panel  -Get rid of all down jackets, pillows and blankets   Followup  - next few to several weeks with Dr Chase Caller at [place] or [place] but after completing  above       SIGNATURE    Dr. Brand Males, M.D., F.C.C.P,  Pulmonary and Critical Care Medicine Staff Physician, [place] Director - Interstitial Lung Disease  Program  [facility] [place], [place], [address]  Pager: [phone], If no answer or between  15:00h - 7:00h: call [phone] Telephone: [phone]  9:43 AM 06/09/2020

## 2020-06-09 NOTE — Patient Instructions (Addendum)
ICD-10-CM   1. Abnormal CT of the chest  R93.89 Aldolase    ANA    ANCA TITERS    Anti-DNA antibody, double-stranded    Antimyeloperoxidase (MPO) Abs    Anti-Scleroderma Antibody    CK Total (and CKMB)    CT Chest High Resolution    Cyclic citrul peptide antibody, IgG (QUEST)    Hypersensitivity Pneumonitis    Jo-1 antibody-IgG    Mpo/pr-3 (anca) antibodies    Pulmonary Function Test [facility] Only    Rheumatoid Factor    Sedimentation rate    Sjogren's syndrome antibods(ssa + ssb)  2. Ground glass opacity present on imaging of lung  R91.8   3. Family history of connective tissue disease  Z82.69   4. Long-term exposure involving bird droppings  Z77.29      - I am concerned you MAY  have Interstitial Lung Disease (ILD). Unclear at this point  -  There are MANY varieties of this - To narrow down possibilities and assess severity please do the following tests  - do full PFT  - - do High Resolution CT chest wo contrast - supine and prone, inspiratory and expiratory images (only Dr Rosario Jacks or Dr Weber Cooks or Dr Polly Cobia or Dr Laqueta Carina to read)  - do autoimmune panel: Serum: ESR, ANA, DS-DNA, RF, anti-CCP, ssA, ssB, scl-70, ANCA, MPO and PR-3 antibodies, Total CK,  Aldolase,  JO-1, Hypersensitivity Pneumonitis Panel  -Get rid of all down jackets, pillows and blankets   Followup  - next few to several weeks with Dr Chase Caller at [place] or [place] but after completing above

## 2020-06-20 ENCOUNTER — Ambulatory Visit
Admission: RE | Admit: 2020-06-20 | Discharge: 2020-06-20 | Disposition: A | Discharge status: Home / Self Care | Payer: PPO | Source: Ambulatory Visit | Attending: Internal Medicine | Admitting: Internal Medicine

## 2020-06-20 ENCOUNTER — Other Ambulatory Visit: Payer: Self-pay

## 2020-06-20 DIAGNOSIS — I2510 Atherosclerotic heart disease of native coronary artery without angina pectoris: Secondary | ICD-10-CM | POA: Diagnosis not present

## 2020-06-20 DIAGNOSIS — J849 Interstitial pulmonary disease, unspecified: Secondary | ICD-10-CM | POA: Diagnosis not present

## 2020-06-20 DIAGNOSIS — R9389 Abnormal findings on diagnostic imaging of other specified body structures: Secondary | ICD-10-CM | POA: Diagnosis not present

## 2020-06-20 DIAGNOSIS — R911 Solitary pulmonary nodule: Secondary | ICD-10-CM | POA: Diagnosis not present

## 2020-06-20 DIAGNOSIS — I700 Atherosclerosis of aorta: Secondary | ICD-10-CM | POA: Diagnosis not present

## 2020-06-30 ENCOUNTER — Telehealth: Payer: Self-pay

## 2020-06-30 NOTE — Telephone Encounter (Signed)
Pt returning missed call. Informed of covid test/pft date and time.

## 2020-06-30 NOTE — Telephone Encounter (Signed)
noted 

## 2020-06-30 NOTE — Telephone Encounter (Signed)
Lm to relay date/time of covid test prior to PFT.   [facility]

## 2020-07-03 ENCOUNTER — Other Ambulatory Visit: Payer: Self-pay

## 2020-07-03 ENCOUNTER — Other Ambulatory Visit
Admission: RE | Admit: 2020-07-03 | Discharge: 2020-07-03 | Disposition: A | Discharge status: Home / Self Care | Payer: PPO | Source: Ambulatory Visit | Attending: Internal Medicine | Admitting: Internal Medicine

## 2020-07-03 DIAGNOSIS — Z01812 Encounter for preprocedural laboratory examination: Secondary | ICD-10-CM | POA: Insufficient documentation

## 2020-07-03 DIAGNOSIS — Z20822 Contact with and (suspected) exposure to covid-19: Secondary | ICD-10-CM | POA: Diagnosis not present

## 2020-07-03 LAB — SARS CORONAVIRUS 2 (TAT 6-24 HRS): SARS Coronavirus 2: NEGATIVE

## 2020-07-04 ENCOUNTER — Ambulatory Visit: Payer: PPO | Attending: Internal Medicine

## 2020-07-04 DIAGNOSIS — R9389 Abnormal findings on diagnostic imaging of other specified body structures: Secondary | ICD-10-CM

## 2020-07-04 DIAGNOSIS — R918 Other nonspecific abnormal finding of lung field: Secondary | ICD-10-CM | POA: Diagnosis not present

## 2020-07-04 MED ORDER — ALBUTEROL SULFATE (2.5 MG/3ML) 0.083% IN NEBU
2.5000 mg | INHALATION_SOLUTION | Freq: Once | RESPIRATORY_TRACT | Status: AC
  Administered 2020-07-04: 2.5 mg via RESPIRATORY_TRACT
  Filled 2020-07-04: qty 3

## 2020-07-06 ENCOUNTER — Telehealth: Payer: Self-pay | Admitting: Internal Medicine

## 2020-07-06 NOTE — Telephone Encounter (Signed)
  Nearly nromal PFT Imrproved CT. No definitive ILD but needs 1 year CT followup to ensure no change   Plan   - ensure followupOV to discuss resultss and symptoms - ok to see APP   PFT Results Latest Ref Rng & Units 07/04/2020  FVC-Pre L 2.93  FVC-Predicted Pre % 102  FVC-Post L 2.93  FVC-Predicted Post % 101  Pre FEV1/FVC % % 81  Post FEV1/FCV % % 85  FEV1-Pre L 2.37  FEV1-Predicted Pre % 109  FEV1-Post L 2.49  DLCO uncorrected ml/min/mmHg 14.07  DLCO [facility]% % 74  DLVA Predicted % 74  TLC L 4.90  TLC % Predicted % 99  RV % Predicted % 82

## 2020-07-07 NOTE — Telephone Encounter (Signed)
Called and spoke with pt letting her know the results per MR and she verbalized understanding. appt has been scheduled for pt with MR for f/u. Nothing further needed.

## 2020-07-09 LAB — PULMONARY FUNCTION TEST ARMC ONLY
DL/VA % pred: 74 %
DL/VA: 3.1 ml/min/mmHg/L
DLCO unc % pred: 74 %
DLCO unc: 14.07 ml/min/mmHg
FEF 25-75 Post: 3.1 L/sec
FEF 25-75 Pre: 2.26 L/sec
FEF2575-%Change-Post: 37 %
FEF2575-%Pred-Post: 168 %
FEF2575-%Pred-Pre: 122 %
FEV1-%Change-Post: 4 %
FEV1-%Pred-Post: 114 %
FEV1-%Pred-Pre: 109 %
FEV1-Post: 2.49 L
FEV1-Pre: 2.37 L
FEV1FVC-%Change-Post: 5 %
FEV1FVC-%Pred-Pre: 106 %
FEV6-%Change-Post: 0 %
FEV6-%Pred-Post: 106 %
FEV6-%Pred-Pre: 106 %
FEV6-Post: 2.93 L
FEV6-Pre: 2.93 L
FEV6FVC-%Pred-Post: 104 %
FEV6FVC-%Pred-Pre: 104 %
FVC-%Change-Post: 0 %
FVC-%Pred-Post: 101 %
FVC-%Pred-Pre: 102 %
FVC-Post: 2.93 L
FVC-Pre: 2.93 L
Post FEV1/FVC ratio: 85 %
Post FEV6/FVC ratio: 100 %
Pre FEV1/FVC ratio: 81 %
Pre FEV6/FVC Ratio: 100 %
RV % pred: 82 %
RV: 1.77 L
TLC % pred: 99 %
TLC: 4.9 L

## 2020-07-21 ENCOUNTER — Encounter: Payer: Self-pay | Admitting: Internal Medicine

## 2020-07-21 ENCOUNTER — Other Ambulatory Visit: Payer: Self-pay

## 2020-07-21 ENCOUNTER — Ambulatory Visit: Payer: PPO | Admitting: Internal Medicine

## 2020-07-21 VITALS — BP 144/82 | HR 82 | Temp 97.8°F | Ht 63.0 in | Wt 148.2 lb

## 2020-07-21 DIAGNOSIS — R918 Other nonspecific abnormal finding of lung field: Secondary | ICD-10-CM

## 2020-07-21 DIAGNOSIS — Z7729 Contact with and (suspected ) exposure to other hazardous substances: Secondary | ICD-10-CM

## 2020-07-21 DIAGNOSIS — M2550 Pain in unspecified joint: Secondary | ICD-10-CM

## 2020-07-21 DIAGNOSIS — R9389 Abnormal findings on diagnostic imaging of other specified body structures: Secondary | ICD-10-CM

## 2020-07-21 NOTE — Patient Instructions (Addendum)
ICD-10-CM   1. Abnormal CT of the chest  R93.89   2. Ground glass opacity present on imaging of lung  R91.8   3. Long-term exposure involving bird droppings  Z77.29   4. Arthralgia, unspecified joint  M25.50     Abnormal CT of the chest Ground glass opacity present on imaging of lung Long-term exposure involving bird droppings  -Most recent CT scan of the chest high-resolution in November 2021 shows improvement compared to summer 2021 and no evidence of definitive interstitial lung disease.  Pulmonary function test is nearly normal.  The only abnormality slight nonspecific scarring in the CT chest  -Glad you got rid of all down feather jackets and pillows  Plan -Expectant follow-up -Do spirometry and DLCO in 1 year  Arthralgia, unspecified joint  - glad you are going to see [facility] rheumatology   Coronary artery calcification   - per cardiology   Follow-up -1 year but after pulmonary function test; return sooner if needed

## 2020-07-21 NOTE — Progress Notes (Signed)
OV 06/09/2020  Subjective:  Patient ID: Lydia Elliott, female , DOB: 16-Feb-1951 , age 69 y.o. , MRN: [id number] , ADDRESS: [address] [facility] [id number] PCP Leonel Ramsay, MD Patient Care Team: Leonel Ramsay, MD as PCP - General (Infectious Diseases) Lucilla Lame, MD as Consulting Physician (Gastroenterology) Jannet Mantis, MD (Dermatology) Teodoro Spray, MD as Consulting Physician (Cardiology)  This Provider for this visit: Treatment Team:  Attending Provider: Brand Males, MD    06/09/2020 -   Chief Complaint  Patient presents with  . Consult    ILD - denies sob, cough, or wheezing.  Reviewed the CT scan and abnormal CT test of   HPI Lydia Elliott 69 y.o. -  She had a CT scan of the chest without contrast in October 2018 that I personally visualized for nodules and not sure she has ILD but then again it is not a high-resolution CT chest.  This was followed up by CT scan of the chest without contrast again in April 2019 where the perifissural nodules are unchanged.  Again I personally visualized the scan no clear evidence of ILD.  Then she had another scan in August 2021 I personally visualized this definite suggestion of groundglass opacities in the bilateral subpleural basis but again does not a high-resolution CT chest and there are no prone images.  This time the radiologist felt looking back in April 2019 she had similar findings although I personally am not sure.  The most clear-cut evidence of this is in August 2021 which is the most recent CT chest that we have.  Patient confirms this history.  She herself who I am meeting for the first time tells me that she does not have any undue shortness of breath.  For years she has done 2 times a week of yoga and she walks 20-30 minutes 3 times a week and is on a golf course with this undulating slopes.  She does get winded when she climbs uphill but this has not changed.  There is no cough.  She  is dealing with arthritis and she has a rheumatology appointment pending.  She has a daughter with connective tissue disease.  There is no wheezing.  No nausea no vomiting no diarrhea.    [facility] Comprehensive ILD Questionnaire  Symptoms:   SYMPTOM SCALE - ILD 06/09/2020   O2 use ra  Shortness of Breath 0 -> 5 scale with 5 being worst (score 6 If unable to do)  At rest 0  Simple tasks - showers, clothes change, eating, shaving 0  Household (dishes, doing bed, laundry) 00  Shopping 0  Walking level at own pace 0  Walking up Stairs 0 (yes for hills)  Total (30-36) Dyspnea Score 0  How bad is your cough? 0  How bad is your fatigue 0  How bad is nausea 0  How bad is vomiting?  00  How bad is diarrhea? 0  How bad is anxiety? 00  How bad is depression 0        Past Medical History :   has a past medical history of Arthritis, CAD (coronary artery disease), Chronic anemia, GERD (gastroesophageal reflux disease), Heart disease, Hyperlipidemia, Hypertension, Insomnia, Microcytosis, Nodule of right lung (10/18/2016), Overweight (BMI 25.0-29.9) (12/23/2015), Polyarthralgia, Thalassemia, Vertigo, and Wears dentures.   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  She  gets steroid shots in her knees.  Easy bruising despite being only on baby aspirin.  No Raynaud's.  No snoring no rash no ulcers.   FAMILY HISTORY of LUNG DISEASE: Brother has COPD.  Daughter has connective tissue disease.  ANother one with psoriatic arthritis   EXPOSURE HISTORY: Does not do cigarettes or tobacco or cocaine or intravenous drug use   HOME and HOBBY DETAILS : [place] for the last 7 years age of the home is 8 years.  No dampness.  No mold no mildew.  No humidifier use no CPAP use no nebulizer use no Jacuzzi use.  No misting Fountain.  No pet birds or parakeets.   No pet gerbils.  Does have a feather pillow . X 7 7ear. She borught some more recently. SHe has down jackets x few to several years but all < 8 years .   No mold in the ducts.  No mildew anywhere.  No gardening habits.  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  She is now retired.  Extensive history for organic and inorganic antigens is negative   PULMONARY TOXICITY HISTORY (27 items): Negative especially no amiodarone or nitrofurantoin.     Simple office walk 185 feet x  3 laps goal with forehead probe 06/09/2020   O2 used ra  Number laps completed 3  Comments about pace Steady, fast  Resting Pulse Ox/HR 100% and 71/min  Final Pulse Ox/HR 100% and 92/min  Desaturated </= 88% no  Desaturated <= 3% points no  Got Tachycardic >/= 90/min yes  Symptoms at end of test None  Miscellaneous comments x    Results for BURNA, ATLAS (MRN [id number]) as of 06/09/2020 09:07  Ref. Range 08/25/2017 12:16  Creatinine Latest Ref Range: 0.50 - 0.99 mg/dL 0.77     ROS - per HPI  OV 07/21/2020  Subjective:  Patient ID: Lydia Elliott, female , DOB: [birth year] , age 69 y.o. , MRN: [id number] , ADDRESS: [address] [place] [facility] [id number] PCP Leonel Ramsay, MD Patient Care Team: Leonel Ramsay, MD as PCP - General (Infectious Diseases) Lucilla Lame, MD as Consulting Physician (Gastroenterology) Jannet Mantis, MD (Dermatology) Teodoro Spray, MD as Consulting Physician (Cardiology)  This Provider for this visit: Treatment Team:  Attending Provider: Brand Males, MD    07/21/2020 -   Chief Complaint  Patient presents with  . Follow-up    Review PFT and CT--no current sx     HPI Lydia Elliott 69 y.o. -follow-up ILD work-up  At last visit there was concern of ILD.  She had exposure to down pillow/jacket.  Told her to get rid of it she  has gotten rid of it.  She has arthralgia.  She is supposed to see [facility] but because of a mixup that appointment has not been postponed.  I ordered autoimmune panel but she did not do it.  She says she will do it with rheumatology instead.  She had CT scan of the chest for definite confirmation of ILD but this shows improvement in her groundglass opacities.  There is just residual nonspecific scarring consistent with "ILA".  Interstitial lung abnormalities.  There is no ILD otherwise call interstitial lung disease.  She is feeling fine.  At this point now she does not even have any undue shortness of breath.  She does have coronary artery  calcifications but she says she follows with cardiology.  Her PFTs are nearly normal with DLCO just below 80%.    Simple office walk 185 feet x  3 laps goal with forehead probe 07/21/2020   O2 used ra  Number laps completed 3  Comments about pace Nl pace  Resting Pulse Ox/HR 99% and 87/min  Final Pulse Ox/HR 99% and 99/min  Desaturated </= 88% no  Desaturated <= 3% points no  Got Tachycardic >/= 90/min yes  Symptoms at end of test asymptomatic  Miscellaneous comments x   PFT Results Latest Ref Rng & Units 07/04/2020  FVC-Pre L 2.93  FVC-Predicted Pre % 102  FVC-Post L 2.93  FVC-Predicted Post % 101  Pre FEV1/FVC % % 81  Post FEV1/FCV % % 85  FEV1-Pre L 2.37  FEV1-Predicted Pre % 109  FEV1-Post L 2.49  DLCO uncorrected ml/min/mmHg 14.07  DLCO UNC% % 74  DLVA Predicted % 74  TLC L 4.90  TLC % Predicted % 99  RV % Predicted % 82          IMPRESSION: 1. Subpleural ground-glass opacity of the dependent lung bases is almost completely resolved on current examination, particularly inspiratory prone imaging. Findings generally suggest minimal scarring without specific features to suggest fibrotic interstitial lung disease. Consider annual follow-up to assess for stability if there is high, persistent clinical suspicion for  fibrotic interstitial lung disease. 2. Coronary artery disease. Aortic Atherosclerosis (ICD10-I70.0).   Electronically Signed   By: Eddie Candle M.D.   On: 06/20/2020 13:05     Result History     ROS - per HPI     has a past medical history of Arthritis, CAD (coronary artery disease), Chronic anemia, GERD (gastroesophageal reflux disease), Heart disease, Hyperlipidemia, Hypertension, Insomnia, Microcytosis, Nodule of right lung (10/18/2016), Overweight (BMI 25.0-29.9) (12/23/2015), Polyarthralgia, Thalassemia, Vertigo, and Wears dentures.   reports that she quit smoking about 49 years ago. Her smoking use included cigarettes. She has a 0.50 pack-year smoking history. She has never used smokeless tobacco.  Past Surgical History:  Procedure Laterality Date  . APPENDECTOMY  1967  . CARPAL TUNNEL RELEASE  2007  . CATARACT EXTRACTION  2013  . CHOLECYSTECTOMY  2000  . COLONOSCOPY  7/31//15   Dr.Wohl  . COLONOSCOPY WITH PROPOFOL N/A 12/11/2015   Procedure: COLONOSCOPY WITH PROPOFOL;  Surgeon: Lucilla Lame, MD;  Location: [facility];  Service: Endoscopy;  Laterality: N/A;  DESCENDING COLON POLYP   . CORONARY ANGIOPLASTY WITH STENT PLACEMENT  2010  . ESOPHAGOGASTRODUODENOSCOPY  02/21/14   Dr.Wohl  . ESOPHAGOGASTRODUODENOSCOPY (EGD) WITH PROPOFOL N/A 12/11/2015   Procedure: ESOPHAGOGASTRODUODENOSCOPY (EGD) WITH PROPOFOL;  Surgeon: Lucilla Lame, MD;  Location: [facility];  Service: Endoscopy;  Laterality: N/A;  . POLYPECTOMY N/A 12/11/2015   Procedure: POLYPECTOMY;  Surgeon: Lucilla Lame, MD;  Location: [facility];  Service: Endoscopy;  Laterality: N/A;  . TONSILLECTOMY AND ADENOIDECTOMY  1969    Allergies  Allergen Reactions  . No Known Allergies   . Rosuvastatin     Other reaction(s): Muscle Pain    Immunization History  Administered Date(s) Administered  . Influenza, High Dose Seasonal PF 04/22/2016, 05/09/2017, 04/20/2018, 04/29/2019  .  Influenza,inj,quad, With Preservative 04/12/2020  . Influenza-Unspecified 06/02/2015, 05/12/2017, 04/20/2018  . PFIZER SARS-COV-2 Vaccination 09/20/2019, 10/11/2019  . Pneumococcal Conjugate-13 09/18/2016  . Pneumococcal Polysaccharide-23 09/11/2017  . Tdap 02/08/2014  . Zoster Recombinat (Shingrix) 04/29/2019, 12/09/2019    Family History  Problem Relation Age of Onset  .  Cirrhosis Mother   . Hepatitis Mother   . Thalassemia Mother   . Diabetes Mother   . Heart disease Father   . Diabetes Father   . Hyperlipidemia Father   . Hypertension Father   . Stroke Father   . Cancer Sister        lung  . Diabetes Sister   . Stroke Sister        during a surgery  . Diabetes Brother   . Heart disease Brother   . Hypertension Brother   . Stroke Brother   . Thalassemia Maternal Grandmother   . COPD Neg Hx      Current Outpatient Medications:  .  ALPRAZolam (XANAX) 0.25 MG tablet, alprazolam 0.25 mg tablet, Disp: , Rfl:  .  aspirin EC 81 MG tablet, Take 81 mg by mouth daily., Disp: , Rfl:  .  busPIRone (BUSPAR) 5 MG tablet, Take 5 mg by mouth 3 (three) times daily., Disp: , Rfl:  .  escitalopram (LEXAPRO) 20 MG tablet, TAKE 1 TABLET BY MOUTH EVERY DAY, Disp: 90 tablet, Rfl: 3 .  losartan (COZAAR) 25 MG tablet, Take 25 mg by mouth 2 (two) times a day. , Disp: , Rfl:  .  Multiple Vitamin (MULTIVITAMIN) tablet, Take 1 tablet by mouth daily., Disp: , Rfl:  .  omeprazole (PRILOSEC) 20 MG capsule, Take 20 mg by mouth daily., Disp: , Rfl:  .  simvastatin (ZOCOR) 40 MG tablet, Take 40 mg by mouth at bedtime., Disp: , Rfl:  .  TURMERIC PO, Take by mouth daily., Disp: , Rfl:  .  zolpidem (AMBIEN) 10 MG tablet, Take 5 mg by mouth at bedtime as needed. , Disp: , Rfl:  .  carvedilol (COREG) 6.25 MG tablet, Take 6.25 mg by mouth 2 (two) times daily with a meal. , Disp: , Rfl:  .  magnesium oxide (MAG-OX) 400 MG tablet, Take by mouth. (Patient not taking: Reported on 07/21/2020), Disp: , Rfl:        Objective:   Vitals:   07/21/20 1101  BP: (!) 144/82  Pulse: 82  Temp: 97.8 F (36.6 C)  TempSrc: Temporal  SpO2: 100%  Weight: 148 lb 3.2 oz (67.2 kg)  Height: 5\' 3"  (1.6 m)    Estimated body mass index is 26.25 kg/m as calculated from the following:   Height as of this encounter: 5\' 3"  (1.6 m).   Weight as of this encounter: 148 lb 3.2 oz (67.2 kg).  @WEIGHTCHANGE @  Filed Weights   07/21/20 1101  Weight: 148 lb 3.2 oz (67.2 kg)     Physical Exam General: No distress. Looks well Neuro: Alert and Oriented x 3. GCS 15. Speech normal Psych: Pleasant Resp:  Barrel Chest - norma.  Wheeze - no, Crackles - no, No overt respiratory distress CVS: Normal heart sounds. Murmurs - no Ext: Stigmata of Connective Tissue Disease - no HEENT: Normal upper airway. PEERL +. No post nasal drip        Assessment:       ICD-10-CM   1. Abnormal CT of the chest  R93.89 Pulmonary Function Test [facility] Only  2. Ground glass opacity present on imaging of lung  R91.8 Pulmonary Function Test [facility] Only  3. Long-term exposure involving bird droppings  Z77.29   4. Arthralgia, unspecified joint  M25.50        Plan:     Patient Instructions     ICD-10-CM   1. Abnormal CT of the chest  R93.89  2. Ground glass opacity present on imaging of lung  R91.8   3. Long-term exposure involving bird droppings  Z77.29   4. Arthralgia, unspecified joint  M25.50     Abnormal CT of the chest Ground glass opacity present on imaging of lung Long-term exposure involving bird droppings  -Most recent CT scan of the chest high-resolution in November 2021 shows improvement compared to summer 2021 and no evidence of definitive interstitial lung disease.  Pulmonary function test is nearly normal.  The only abnormality slight nonspecific scarring in the CT chest  -Glad you got rid of all down feather jackets and pillows  Plan -Expectant follow-up -Do spirometry and DLCO in 1 year  Arthralgia,  unspecified joint  - glad you are going to see [facility] rheumatology   Coronary artery calcification   - per cardiology   Follow-up -1 year but after pulmonary function test; return sooner if needed     SIGNATURE    Dr. Brand Males, M.D., F.C.C.P,  Pulmonary and Critical Care Medicine Staff Physician, [place] Director - Interstitial Lung Disease  Program  Pulmonary [place] at [place], [place], [address]  Pager: [phone], If no answer or between  15:00h - 7:00h: call [phone] Telephone: [phone]  11:49 AM 07/21/2020

## 2020-07-23 ENCOUNTER — Encounter: Payer: Self-pay | Admitting: Emergency Medicine

## 2020-07-23 ENCOUNTER — Ambulatory Visit
Admission: EM | Admit: 2020-07-23 | Discharge: 2020-07-23 | Disposition: A | Discharge status: Home / Self Care | Payer: PPO | Attending: Physician Assistant | Admitting: Physician Assistant

## 2020-07-23 ENCOUNTER — Other Ambulatory Visit: Payer: Self-pay

## 2020-07-23 ENCOUNTER — Ambulatory Visit (INDEPENDENT_AMBULATORY_CARE_PROVIDER_SITE_OTHER): Payer: PPO

## 2020-07-23 DIAGNOSIS — M7989 Other specified soft tissue disorders: Secondary | ICD-10-CM | POA: Diagnosis not present

## 2020-07-23 DIAGNOSIS — S93602A Unspecified sprain of left foot, initial encounter: Secondary | ICD-10-CM

## 2020-07-23 DIAGNOSIS — M19072 Primary osteoarthritis, left ankle and foot: Secondary | ICD-10-CM | POA: Diagnosis not present

## 2020-07-23 DIAGNOSIS — M79672 Pain in left foot: Secondary | ICD-10-CM | POA: Diagnosis not present

## 2020-07-23 DIAGNOSIS — S9032XA Contusion of left foot, initial encounter: Secondary | ICD-10-CM | POA: Diagnosis not present

## 2020-07-23 DIAGNOSIS — R2242 Localized swelling, mass and lump, left lower limb: Secondary | ICD-10-CM | POA: Diagnosis not present

## 2020-07-23 NOTE — ED Triage Notes (Signed)
Pt c/o left foot pain and swelling. she's states about 6 weeks ago she feel down the stairs and caught her foot in the banister. She had swelling and bruising but di not have it looked at.

## 2020-07-23 NOTE — ED Provider Notes (Signed)
[facility]    CSN: [id number] Arrival date & time: 07/23/20  1349      History   Chief Complaint Chief Complaint  Patient presents with  . Foot Pain    left    HPI Lydia Elliott is a 69 y.o. female presenting for left foot pain and swelling x 6 weeks. She says that she fell down some stairs and got her foot caught between 2 rails.  She has not been seen for the injury until this time.  Patient states that the condition is manageable if she stays off of her foot.  She states that if she walks a lot she will get increased swelling.  Patient has been icing her foot and taking Tylenol.  She denies any worsening of the condition.  Overall she says it is a little better than it was when she initially injured it.  She is concerned about fractures.  Denies any history of fracture satisfied.  Denies any associated numbness or tingling.  She has increased pain when she plantar flexes.  She has no other complaints or concerns today.  HPI  Past Medical History:  Diagnosis Date  . Arthritis    hands, knees, toes  . CAD (coronary artery disease)   . Chronic anemia   . GERD (gastroesophageal reflux disease)   . Heart disease    has a stent  . Hyperlipidemia   . Hypertension   . Insomnia   . Microcytosis    Hgb 10.9, MCV 66 April 2015  . Nodule of right lung 10/18/2016  . Overweight (BMI 25.0-29.9) 12/23/2015  . Polyarthralgia   . Thalassemia   . Vertigo    none - 10 yrs  . Wears dentures    partial lower    Patient Active Problem List   Diagnosis Date Noted  . Aortic atherosclerosis ([place]) 10/18/2016  . Nodule of right lung 10/18/2016  . GERD (gastroesophageal reflux disease) 04/24/2016  . Anxiety about health 12/23/2015  . Overweight (BMI 25.0-29.9) 12/23/2015  . Benign neoplasm of descending colon   . Impaired fasting glucose 07/03/2015  . Anemia 07/03/2015  . Takotsubo cardiomyopathy 06/18/2015  . Hypertension   . Hyperlipidemia   . Insomnia   .  Polyarthralgia   . CAD (coronary artery disease)   . Thalassemia   . Heart disease     Past Surgical History:  Procedure Laterality Date  . APPENDECTOMY  1967  . CARPAL TUNNEL RELEASE  2007  . CATARACT EXTRACTION  2013  . CHOLECYSTECTOMY  2000  . COLONOSCOPY  7/31//15   Dr.Wohl  . COLONOSCOPY WITH PROPOFOL N/A 12/11/2015   Procedure: COLONOSCOPY WITH PROPOFOL;  Surgeon: Lucilla Lame, MD;  Location: [facility];  Service: Endoscopy;  Laterality: N/A;  DESCENDING COLON POLYP   . CORONARY ANGIOPLASTY WITH STENT PLACEMENT  2010  . ESOPHAGOGASTRODUODENOSCOPY  02/21/14   Dr.Wohl  . ESOPHAGOGASTRODUODENOSCOPY (EGD) WITH PROPOFOL N/A 12/11/2015   Procedure: ESOPHAGOGASTRODUODENOSCOPY (EGD) WITH PROPOFOL;  Surgeon: Lucilla Lame, MD;  Location: [facility];  Service: Endoscopy;  Laterality: N/A;  . POLYPECTOMY N/A 12/11/2015   Procedure: POLYPECTOMY;  Surgeon: Lucilla Lame, MD;  Location: [facility];  Service: Endoscopy;  Laterality: N/A;  . TONSILLECTOMY AND ADENOIDECTOMY  1969    OB History   No obstetric history on file.      Home Medications    Prior to Admission medications   Medication Sig Start Date End Date Taking? Authorizing Provider  aspirin EC 81 MG tablet Take  81 mg by mouth daily.   Yes [provider]  busPIRone (BUSPAR) 5 MG tablet Take 5 mg by mouth 3 (three) times daily. 01/25/20  Yes [provider]  carvedilol (COREG) 6.25 MG tablet Take 6.25 mg by mouth 2 (two) times daily with a meal.  03/15/16 07/23/20 Yes [provider]  escitalopram (LEXAPRO) 20 MG tablet TAKE 1 TABLET BY MOUTH EVERY DAY 11/10/19  Yes Delsa Grana, PA-C  losartan (COZAAR) 25 MG tablet Take 25 mg by mouth 2 (two) times a day.  08/03/18  Yes [provider]  magnesium oxide (MAG-OX) 400 MG tablet Take by mouth. 02/02/20 02/01/21 Yes [provider]  Multiple Vitamin (MULTIVITAMIN) tablet Take 1 tablet by mouth daily.   Yes [provider]  omeprazole (PRILOSEC) 20 MG capsule Take 20 mg by mouth daily.   Yes [provider]  simvastatin (ZOCOR) 40 MG tablet Take 40 mg by mouth at bedtime.   Yes [provider]  zolpidem (AMBIEN) 10 MG tablet Take 5 mg by mouth at bedtime as needed.  05/19/15  Yes [provider]  ALPRAZolam Duanne Moron) 0.25 MG tablet alprazolam 0.25 mg tablet    [provider]  TURMERIC PO Take by mouth daily.    [provider]    Family History Family History  Problem Relation Age of Onset  . Cirrhosis Mother   . Hepatitis Mother   . Thalassemia Mother   . Diabetes Mother   . Heart disease Father   . Diabetes Father   . Hyperlipidemia Father   . Hypertension Father   . Stroke Father   . Cancer Sister        lung  . Diabetes Sister   . Stroke Sister        during a surgery  . Diabetes Brother   . Heart disease Brother   . Hypertension Brother   . Stroke Brother   . Thalassemia Maternal Grandmother   . COPD Neg Hx     Social History Social History   Tobacco Use  . Smoking status: Former Smoker    Packs/day: 0.50    Years: 1.00    Pack years: 0.50    Types: Cigarettes    Quit date: 1972    Years since quitting: 49.9  . Smokeless tobacco: Never Used  . Tobacco comment: smoked occasionally  Vaping Use  . Vaping Use: Never used  Substance Use Topics  . Alcohol use: Yes    Alcohol/week: 1.0 standard drink    Types: 1 Shots of liquor per week    Comment: socially  . Drug use: No     Allergies   No known allergies and Rosuvastatin   Review of Systems Review of Systems  Musculoskeletal: Positive for arthralgias and joint swelling. Negative for gait problem.  Skin: Negative for color change, rash and wound.  Neurological: Negative for weakness and numbness.     Physical Exam Triage Vital Signs ED Triage Vitals  Enc Vitals Group     BP 07/23/20 1359 (!) 163/78     Pulse Rate 07/23/20 1359 76     Resp 07/23/20 1359  18     Temp 07/23/20 1359 98.2 F (36.8 C)     Temp Source 07/23/20 1359 Oral     SpO2 07/23/20 1359 100 %     Weight 07/23/20 1400 148 lb 2.4 oz (67.2 kg)     Height 07/23/20 1400 5\' 3"  (1.6 m)  Head Circumference --      Peak Flow --      Pain Score 07/23/20 1400 6     Pain Loc --      Pain Edu? --      Excl. in [place]? --    No data found.  Updated Vital Signs BP (!) 163/78 (BP Location: Right Arm)   Pulse 76   Temp 98.2 F (36.8 C) (Oral)   Resp 18   Ht 5\' 3"  (1.6 m)   Wt 148 lb 2.4 oz (67.2 kg)   SpO2 100%   BMI 26.24 kg/m       Physical Exam Vitals and nursing note reviewed.  Constitutional:      General: She is not in acute distress.    Appearance: Normal appearance. She is not ill-appearing or toxic-appearing.  HENT:     Head: Normocephalic and atraumatic.  Eyes:     General: No scleral icterus.       Right eye: No discharge.        Left eye: No discharge.     Conjunctiva/sclera: Conjunctivae normal.  Cardiovascular:     Rate and Rhythm: Normal rate and regular rhythm.     Heart sounds: Normal heart sounds.  Pulmonary:     Effort: Pulmonary effort is normal. No respiratory distress.     Breath sounds: Normal breath sounds.  Musculoskeletal:     Cervical back: Neck supple.     Left foot: Normal range of motion. Swelling (mild swelling dorsal distal foot) and tenderness (diffuse TTP metatarsals dorsally) present. Normal pulse.  Skin:    General: Skin is dry.  Neurological:     General: No focal deficit present.     Mental Status: She is alert. Mental status is at baseline.     Motor: No weakness.     Gait: Gait abnormal (mild limp).  Psychiatric:        Mood and Affect: Mood normal.        Behavior: Behavior normal.        Thought Content: Thought content normal.      UC Treatments / Results  Labs (all labs ordered are listed, but only abnormal results are displayed) Labs Reviewed - No data to display  EKG   Radiology DG Foot Complete  Left  Result Date: 07/23/2020 CLINICAL DATA:  Pt c/o left foot pain and swelling. she's states about 6 weeks ago she feel down the stairs and caught her foot in the banister. She had swelling and bruising but di not have it looked at. EXAM: LEFT FOOT - COMPLETE 3+ VIEW COMPARISON:  None. FINDINGS: There is no evidence of fracture or dislocation. Scattered mild degenerative changes. No aggressive osseous lesion. Soft tissues are unremarkable. IMPRESSION: Negative. Electronically Signed   By: Audie Pinto M.D.   On: 07/23/2020 14:47    Procedures Procedures (including critical care time)  Medications Ordered in UC Medications - No data to display  Initial Impression / Assessment and Plan / UC Course  I have reviewed the triage vital signs and the nursing notes.  Pertinent labs & imaging results that were available during my care of the patient were reviewed by me and considered in my medical decision making (see chart for details).   Imaging is negative for any fractures or significant degenerative changes.  Discussed results with patient.  Suspect sprain that has not fully healed.  Advised continue RICE and Tylenol.  Also advised her to use her Ace wrap.  Advised to follow-up with orthopedics if is not better in the next 2 weeks.  Advised to follow-up sooner for any worsening of symptoms.  Patient agreeable.  Final Clinical Impressions(s) / UC Diagnoses   Final diagnoses:  Sprain of left foot, initial encounter  Foot pain, left     Discharge Instructions     SPRAIN: Stressed avoiding painful activities . Reviewed RICE guidelines. Use medications as directed, including NSAIDs. If no NSAIDs have been prescribed for you today, you may take Aleve or Motrin over the counter. May use Tylenol in between doses of NSAIDs.  If no improvement in the next 1-2 weeks, f/u with PCP or return to our office for reexamination, and please feel free to call or return at any time for any questions or  concerns you may have and we will be happy to help you!      You may have a condition requiring you to follow up with Orthopedics so please call one of the following office for appointment:   [facility] [address] [place], [place] [address] Phone: [phone]  [facility] [address] [place], [place] [address] Phone: [phone]     ED Prescriptions    None     PDMP not reviewed this encounter.   Danton Clap, PA-C 07/23/20 1502

## 2020-07-23 NOTE — Discharge Instructions (Addendum)
SPRAIN: Stressed avoiding painful activities . Reviewed RICE guidelines. Use medications as directed, including NSAIDs. If no NSAIDs have been prescribed for you today, you may take Aleve or Motrin over the counter. May use Tylenol in between doses of NSAIDs.  If no improvement in the next 1-2 weeks, f/u with PCP or return to our office for reexamination, and please feel free to call or return at any time for any questions or concerns you may have and we will be happy to help you!     ? ?You may have a condition requiring you to follow up with Orthopedics so please call one of the following office for appointment:  ? ?[facility] ?[address], [place], [place] [address] ?Phone: [phone] ? ?[facility] ?[address], [place], [place] [address] ?Phone: [phone]  ?

## 2020-07-27 DIAGNOSIS — L4050 Arthropathic psoriasis, unspecified: Secondary | ICD-10-CM | POA: Diagnosis not present

## 2020-07-27 DIAGNOSIS — Z111 Encounter for screening for respiratory tuberculosis: Secondary | ICD-10-CM | POA: Diagnosis not present

## 2020-07-27 DIAGNOSIS — Z79899 Other long term (current) drug therapy: Secondary | ICD-10-CM | POA: Diagnosis not present

## 2020-08-22 DIAGNOSIS — I2510 Atherosclerotic heart disease of native coronary artery without angina pectoris: Secondary | ICD-10-CM | POA: Diagnosis not present

## 2020-08-22 DIAGNOSIS — I5181 Takotsubo syndrome: Secondary | ICD-10-CM | POA: Diagnosis not present

## 2020-08-22 DIAGNOSIS — I10 Essential (primary) hypertension: Secondary | ICD-10-CM | POA: Diagnosis not present

## 2020-08-22 DIAGNOSIS — E782 Mixed hyperlipidemia: Secondary | ICD-10-CM | POA: Diagnosis not present

## 2020-08-22 DIAGNOSIS — I38 Endocarditis, valve unspecified: Secondary | ICD-10-CM | POA: Diagnosis not present

## 2020-09-13 DIAGNOSIS — M170 Bilateral primary osteoarthritis of knee: Secondary | ICD-10-CM | POA: Diagnosis not present

## 2020-10-20 DIAGNOSIS — M170 Bilateral primary osteoarthritis of knee: Secondary | ICD-10-CM | POA: Diagnosis not present

## 2020-10-25 DIAGNOSIS — M533 Sacrococcygeal disorders, not elsewhere classified: Secondary | ICD-10-CM | POA: Diagnosis not present

## 2020-10-25 DIAGNOSIS — L4050 Arthropathic psoriasis, unspecified: Secondary | ICD-10-CM | POA: Diagnosis not present

## 2020-10-25 DIAGNOSIS — Z79899 Other long term (current) drug therapy: Secondary | ICD-10-CM | POA: Diagnosis not present

## 2020-10-31 ENCOUNTER — Other Ambulatory Visit: Payer: Self-pay | Admitting: Family Medicine

## 2020-10-31 DIAGNOSIS — F411 Generalized anxiety disorder: Secondary | ICD-10-CM

## 2020-11-02 DIAGNOSIS — H02831 Dermatochalasis of right upper eyelid: Secondary | ICD-10-CM | POA: Diagnosis not present

## 2020-11-02 DIAGNOSIS — H26493 Other secondary cataract, bilateral: Secondary | ICD-10-CM | POA: Diagnosis not present

## 2020-11-02 DIAGNOSIS — H02834 Dermatochalasis of left upper eyelid: Secondary | ICD-10-CM | POA: Diagnosis not present

## 2020-11-02 DIAGNOSIS — G43109 Migraine with aura, not intractable, without status migrainosus: Secondary | ICD-10-CM | POA: Diagnosis not present

## 2020-12-20 DIAGNOSIS — Z79899 Other long term (current) drug therapy: Secondary | ICD-10-CM | POA: Diagnosis not present

## 2020-12-20 DIAGNOSIS — L409 Psoriasis, unspecified: Secondary | ICD-10-CM | POA: Diagnosis not present

## 2020-12-20 DIAGNOSIS — L4050 Arthropathic psoriasis, unspecified: Secondary | ICD-10-CM | POA: Diagnosis not present

## 2020-12-28 ENCOUNTER — Other Ambulatory Visit: Payer: Self-pay | Admitting: Family Medicine

## 2020-12-28 DIAGNOSIS — F411 Generalized anxiety disorder: Secondary | ICD-10-CM

## 2021-03-02 DIAGNOSIS — I2510 Atherosclerotic heart disease of native coronary artery without angina pectoris: Secondary | ICD-10-CM | POA: Diagnosis not present

## 2021-03-02 DIAGNOSIS — I5181 Takotsubo syndrome: Secondary | ICD-10-CM | POA: Diagnosis not present

## 2021-03-02 DIAGNOSIS — I38 Endocarditis, valve unspecified: Secondary | ICD-10-CM | POA: Diagnosis not present

## 2021-03-02 DIAGNOSIS — I10 Essential (primary) hypertension: Secondary | ICD-10-CM | POA: Diagnosis not present

## 2021-04-23 DIAGNOSIS — G8929 Other chronic pain: Secondary | ICD-10-CM | POA: Diagnosis not present

## 2021-04-23 DIAGNOSIS — L4050 Arthropathic psoriasis, unspecified: Secondary | ICD-10-CM | POA: Diagnosis not present

## 2021-04-23 DIAGNOSIS — M7711 Lateral epicondylitis, right elbow: Secondary | ICD-10-CM | POA: Diagnosis not present

## 2021-04-23 DIAGNOSIS — Z79899 Other long term (current) drug therapy: Secondary | ICD-10-CM | POA: Diagnosis not present

## 2021-04-23 DIAGNOSIS — M25561 Pain in right knee: Secondary | ICD-10-CM | POA: Diagnosis not present

## 2021-04-27 DIAGNOSIS — Z23 Encounter for immunization: Secondary | ICD-10-CM | POA: Diagnosis not present

## 2021-04-27 DIAGNOSIS — I10 Essential (primary) hypertension: Secondary | ICD-10-CM | POA: Diagnosis not present

## 2021-04-27 DIAGNOSIS — F411 Generalized anxiety disorder: Secondary | ICD-10-CM | POA: Diagnosis not present

## 2021-04-27 DIAGNOSIS — E782 Mixed hyperlipidemia: Secondary | ICD-10-CM | POA: Diagnosis not present

## 2021-04-27 DIAGNOSIS — F5104 Psychophysiologic insomnia: Secondary | ICD-10-CM | POA: Diagnosis not present

## 2021-04-27 DIAGNOSIS — Z0000 Encounter for general adult medical examination without abnormal findings: Secondary | ICD-10-CM | POA: Diagnosis not present

## 2021-04-27 DIAGNOSIS — L4050 Arthropathic psoriasis, unspecified: Secondary | ICD-10-CM | POA: Diagnosis not present

## 2021-06-20 ENCOUNTER — Other Ambulatory Visit: Payer: PPO

## 2021-06-21 ENCOUNTER — Ambulatory Visit: Payer: PPO

## 2021-07-23 DIAGNOSIS — L4050 Arthropathic psoriasis, unspecified: Secondary | ICD-10-CM | POA: Diagnosis not present

## 2021-07-23 DIAGNOSIS — M25561 Pain in right knee: Secondary | ICD-10-CM | POA: Diagnosis not present

## 2021-07-23 DIAGNOSIS — L409 Psoriasis, unspecified: Secondary | ICD-10-CM | POA: Diagnosis not present

## 2021-07-23 DIAGNOSIS — Z79899 Other long term (current) drug therapy: Secondary | ICD-10-CM | POA: Diagnosis not present

## 2021-07-23 DIAGNOSIS — G8929 Other chronic pain: Secondary | ICD-10-CM | POA: Diagnosis not present

## 2021-07-23 DIAGNOSIS — M25521 Pain in right elbow: Secondary | ICD-10-CM | POA: Diagnosis not present

## 2021-08-10 DIAGNOSIS — M170 Bilateral primary osteoarthritis of knee: Secondary | ICD-10-CM | POA: Diagnosis not present

## 2021-08-15 DIAGNOSIS — R21 Rash and other nonspecific skin eruption: Secondary | ICD-10-CM | POA: Diagnosis not present

## 2021-08-15 DIAGNOSIS — H02831 Dermatochalasis of right upper eyelid: Secondary | ICD-10-CM | POA: Diagnosis not present

## 2021-08-15 DIAGNOSIS — H524 Presbyopia: Secondary | ICD-10-CM | POA: Diagnosis not present

## 2021-08-15 DIAGNOSIS — H02834 Dermatochalasis of left upper eyelid: Secondary | ICD-10-CM | POA: Diagnosis not present

## 2021-08-15 DIAGNOSIS — H26493 Other secondary cataract, bilateral: Secondary | ICD-10-CM | POA: Diagnosis not present

## 2021-09-04 DIAGNOSIS — I38 Endocarditis, valve unspecified: Secondary | ICD-10-CM | POA: Diagnosis not present

## 2021-09-04 DIAGNOSIS — I5181 Takotsubo syndrome: Secondary | ICD-10-CM | POA: Diagnosis not present

## 2021-09-04 DIAGNOSIS — I2510 Atherosclerotic heart disease of native coronary artery without angina pectoris: Secondary | ICD-10-CM | POA: Diagnosis not present

## 2021-09-04 DIAGNOSIS — E782 Mixed hyperlipidemia: Secondary | ICD-10-CM | POA: Diagnosis not present

## 2021-09-04 DIAGNOSIS — I10 Essential (primary) hypertension: Secondary | ICD-10-CM | POA: Diagnosis not present

## 2021-09-19 ENCOUNTER — Other Ambulatory Visit: Payer: Self-pay | Admitting: Orthopedic Surgery

## 2021-09-26 ENCOUNTER — Other Ambulatory Visit: Payer: Self-pay

## 2021-09-26 ENCOUNTER — Other Ambulatory Visit
Admission: RE | Admit: 2021-09-26 | Discharge: 2021-09-26 | Disposition: A | Discharge status: Home / Self Care | Payer: PPO | Source: Ambulatory Visit | Attending: Orthopedic Surgery | Admitting: Orthopedic Surgery

## 2021-09-26 DIAGNOSIS — Z01812 Encounter for preprocedural laboratory examination: Secondary | ICD-10-CM

## 2021-09-26 HISTORY — DX: Depression, unspecified: F32.A

## 2021-09-26 HISTORY — DX: Anxiety disorder, unspecified: F41.9

## 2021-09-26 NOTE — Patient Instructions (Addendum)
Your procedure is scheduled on: 10/04/21 - Thursday Report to the Registration Desk on the 1st floor of the [facility]. To find out your arrival time, please call [phone] between 1PM - 3PM on: 10/03/21 - Wednesday Report to the [facility] for Lab/EKG on 10/01/21 at 9 am .  REMEMBER: Instructions that are not followed completely may result in serious medical risk, up to and including death; or upon the discretion of your surgeon and anesthesiologist your surgery may need to be rescheduled.  Do not eat food or drink any fluids after midnight the night before surgery.  No gum chewing, lozengers or hard candies.   TAKE THESE MEDICATIONS THE MORNING OF SURGERY WITH A SIP OF WATER:  - carvedilol (COREG) 3.125 MG tablet - omeprazole (PRILOSEC) 20 MG capsule, (take one the night before and one on the morning of surgery - helps to prevent nausea after surgery.) - busPIRone (BUSPAR) 5 MG tablet - ALPRAZolam (XANAX) 0.25 MG tablet if needed.  One week prior to surgery: Stop Anti-inflammatories (NSAIDS) such as Advil, Aleve, Ibuprofen, Motrin, Naproxen, Naprosyn and Aspirin based products such as Excedrin, Goodys Powder, BC Powder.  Stop ANY OVER THE COUNTER supplements until after surgery.  You may take Tylenol if needed for pain up until the day of surgery.  No Alcohol for 24 hours before or after surgery.  No Smoking including e-cigarettes for 24 hours prior to surgery.  No chewable tobacco products for at least 6 hours prior to surgery.  No nicotine patches on the day of surgery.  Do not use any "recreational" drugs for at least a week prior to your surgery.  Please be advised that the combination of cocaine and anesthesia may have negative outcomes, up to and including death. If you test positive for cocaine, your surgery will be cancelled.  On the morning of surgery brush your teeth with toothpaste and water, you may rinse your mouth with mouthwash if you wish. Do not  swallow any toothpaste or mouthwash.  Use CHG Soap or wipes as directed on instruction sheet.  Do not wear jewelry, make-up, hairpins, clips or nail polish.  Do not wear lotions, powders, or perfumes.   Do not shave body from the neck down 48 hours prior to surgery just in case you cut yourself which could leave a site for infection.  Also, freshly shaved skin may become irritated if using the CHG soap.  Contact lenses, hearing aids and dentures may not be worn into surgery.  Do not bring valuables to the hospital. [facility] is not responsible for any missing/lost belongings or valuables.   Notify your doctor if there is any change in your medical condition (cold, fever, infection).  Wear comfortable clothing (specific to your surgery type) to the hospital.  After surgery, you can help prevent lung complications by doing breathing exercises.  Take deep breaths and cough every 1-2 hours. Your doctor may order a device called an Incentive Spirometer to help you take deep breaths. When coughing or sneezing, hold a pillow firmly against your incision with both hands. This is called splinting. Doing this helps protect your incision. It also decreases belly discomfort.  If you are being admitted to the hospital overnight, leave your suitcase in the car. After surgery it may be brought to your room.  If you are being discharged the day of surgery, you will not be allowed to drive home. You will need a responsible adult (18 years or older) to drive you  home and stay with you that night.   If you are taking public transportation, you will need to have a responsible adult (18 years or older) with you. Please confirm with your physician that it is acceptable to use public transportation.   Please call the [facility] Dept. at [phone] if you have any questions about these instructions.  Surgery Visitation Policy:  Patients undergoing a surgery or procedure may have one  family member or support person with them as long as that person is not COVID-19 positive or experiencing its symptoms.  That person may remain in the waiting area during the procedure and may rotate out with other people.  Inpatient Visitation:    Visiting hours are 7 a.m. to 8 p.m. Up to two visitors ages 16+ are allowed at one time in a patient room. The visitors may rotate out with other people during the day. Visitors must check out when they leave, or other visitors will not be allowed. One designated support person may remain overnight. The visitor must pass COVID-19 screenings, use hand sanitizer when entering and exiting the patients room and wear a mask at all times, including in the patients room. Patients must also wear a mask when staff or their visitor are in the room. Masking is required regardless of vaccination status.

## 2021-10-01 ENCOUNTER — Other Ambulatory Visit
Admission: RE | Admit: 2021-10-01 | Discharge: 2021-10-01 | Disposition: A | Discharge status: Home / Self Care | Payer: PPO | Source: Ambulatory Visit | Attending: Orthopedic Surgery | Admitting: Orthopedic Surgery

## 2021-10-01 ENCOUNTER — Other Ambulatory Visit: Payer: Self-pay

## 2021-10-01 ENCOUNTER — Encounter: Payer: Self-pay | Admitting: Urgent Care

## 2021-10-01 ENCOUNTER — Encounter: Payer: Self-pay | Admitting: Orthopedic Surgery

## 2021-10-01 DIAGNOSIS — Z01818 Encounter for other preprocedural examination: Secondary | ICD-10-CM | POA: Diagnosis not present

## 2021-10-01 DIAGNOSIS — Z01812 Encounter for preprocedural laboratory examination: Secondary | ICD-10-CM

## 2021-10-01 LAB — BASIC METABOLIC PANEL
Anion gap: 7 (ref 5–15)
BUN: 28 mg/dL — ABNORMAL HIGH (ref 8–23)
CO2: 26 mmol/L (ref 22–32)
Calcium: 8.9 mg/dL (ref 8.9–10.3)
Chloride: 101 mmol/L (ref 98–111)
Creatinine, Ser: 1.1 mg/dL — ABNORMAL HIGH (ref 0.44–1.00)
GFR, Estimated: 54 mL/min — ABNORMAL LOW (ref >60)
Glucose, Bld: 113 mg/dL — ABNORMAL HIGH (ref 70–99)
Potassium: 4.1 mmol/L (ref 3.5–5.1)
Sodium: 134 mmol/L — ABNORMAL LOW (ref 135–145)

## 2021-10-01 NOTE — Progress Notes (Signed)
Perioperative Services  Pre-Admission/Anesthesia Testing Clinical Review  Date: 10/03/21  Patient Demographics:  Name: Lydia Elliott DOB:   05/26/1951 MRN:   [id number]  Planned Surgical Procedure(s):    Case: [id number] Date/Time: 10/04/21 0730   Procedure: ARTHROSCOPY KNEE (Right: Knee)   Anesthesia type: General   Pre-op diagnosis: Right Knee Synovitis   Location: [facility] OR ROOM 02 / [place] [facility]S FOR ANESTHESIA GROUP   Surgeons: Thornton Park, MD   NOTE: Available PAT nursing documentation and vital signs have been reviewed. Clinical nursing staff has updated patient's PMH/PSHx, current medication list, and drug allergies/intolerances to ensure comprehensive history available to assist in medical decision making as it pertains to the aforementioned surgical procedure and anticipated anesthetic course. Extensive review of available clinical information performed. Lydia Elliott PMH and PSHx updated with any diagnoses/procedures that  may have been inadvertently omitted during her intake with the pre-admission testing department's nursing staff.  Clinical Discussion:  Lydia Elliott is a 71 y.o. female who is submitted for pre-surgical anesthesia review and clearance prior to her undergoing the above procedure. Patient is a Former Smoker (0.5 pack years; quit 08/1970). Pertinent PMH includes: CAD, Takotsubo cardiomyopathy, aortic atherosclerosis, HTN, HLD, GERD (on daily PPI), thalassemia, OA, anxiety (on BZO), depression.  Patient is followed by cardiology Ubaldo Glassing, MD). She was last seen in the cardiology clinic on 09/04/2021; notes reviewed.  At the time of her clinic visit, patient doing well overall from a cardiovascular perspective.  She denied any episodes of chest pain, short of breath, PND, orthopnea, significant peripheral edema, vertiginous symptoms, or presyncope/syncope.  Patient with intermittent palpitations associated with increased anxiety, however she noted this to be under better  control as of late.  PMH significant for cardiovascular diagnoses.  Patient reportedly underwent PCI back in 2010.  She is reported to have a cardiac stent x1, however type and location of stent unknown at time of consult.  Myocardial perfusion imaging study performed on 02/27/2016 revealed normal left ventricular systolic function with an EF of 72%.  There were no regional wall motion abnormalities.  Exercise tolerance was fair.  There was no evidence of stress-induced myocardial ischemia or arrhythmia.  Study determined to be low risk.  TTE performed on 04/04/2020 revealed normal left ventricular systolic function with mild LVH; LVEF >55%.  There was trivial MR, in addition to mild AR and TR; no AR.  There is no evidence of a significant transvalvular gradient to suggest valvular stenosis.  Blood pressure well controlled at 120/64 on currently prescribed beta-blocker and ARB therapies.  Patient is on a statin for her HLD and further ASCVD prevention.  Patient is not diabetic. Functional capacity, as defined by DASI, is documented as being >/= 4 METS.  No changes were made to her medication regimen.  Patient to follow-up with outpatient cardiology 8 months or sooner if needed.  Lydia Elliott is scheduled for an elective RIGHT knee arthroscopy on 10/04/2021 with Dr. Thornton Park, MD. Given patient's past medical history significant for cardiovascular diagnoses, presurgical cardiac clearance was sought by the PAT team. Per cardiology, "this patient is optimized for surgery and may proceed with the planned procedural course with an ACCEPTABLE risk of significant perioperative cardiovascular complications".  In review of her medication reconciliation, it is noted the patient is not currently taking any type of anticoagulation or antiplatelet therapies that may be held during the perioperative course.  Patient denies previous perioperative complications with anesthesia in the past. In review of the  available records, it is  noted that patient underwent a MAC anesthetic course at [facility] (ASA II) in 12/2015 without documented complications.   Vitals with BMI 07/23/2020 07/21/2020 06/09/2020  Height _0  _1  _2   Weight 148 lbs 2 oz 148 lbs 3 oz 145 lbs 6 oz  BMI 26.25 92.33 00.76  Systolic 226 333 545  Diastolic 78 82 72  Pulse 76 82 71    Providers/Specialists:   NOTE: Primary physician provider listed below. Patient may have been seen by APP or partner within same practice.   PROVIDER ROLE / SPECIALTY LAST Larey Seat, MD Orthopedics (Surgeon) 08/10/2021  Leonel Ramsay, MD Primary Care Provider 04/27/2021  Bartholome Bill, MD Cardiology 09/04/2021  Ephriam Jenkins, MD Rheumatology 07/23/2021   Allergies:  Rosuvastatin  Current Home Medications:   No current facility-administered medications for this encounter.    ALPRAZolam (XANAX) 0.25 MG tablet   atorvastatin (LIPITOR) 20 MG tablet   busPIRone (BUSPAR) 5 MG tablet   carvedilol (COREG) 3.125 MG tablet   diclofenac Sodium (VOLTAREN) 1 % GEL   escitalopram (LEXAPRO) 20 MG tablet   losartan (COZAAR) 25 MG tablet   omeprazole (PRILOSEC) 20 MG capsule   zolpidem (AMBIEN) 10 MG tablet   Adalimumab (HUMIRA) 40 MG/0.8ML PSKT   History:   Past Medical History:  Diagnosis Date   Anemia    Anxiety    a.) on BZO   Aortic atherosclerosis (HCC)    Arthritis    hands, knees, toes   Bone island 10/15/2016   a.) RIGHT humeral head   CAD (coronary artery disease)    a.) 2010 --> PCI with stent placement x 1 (type location unknown)   Cataract    Depression    GERD (gastroesophageal reflux disease)    Hyperlipidemia    Hypertension    IBS (irritable bowel syndrome)    Insomnia    a.) on zolpidem   Nodule of right lung 10/18/2016   Psoriatic arthritis ([place])    Takotsubo cardiomyopathy    Thalassemia    Vertigo    none - 10 yrs   Wears dentures    partial lower   Past Surgical  History:  Procedure Laterality Date   APPENDECTOMY  08/12/1965   CARPAL TUNNEL RELEASE  08/12/2005   CATARACT EXTRACTION  08/13/2011   CHOLECYSTECTOMY  08/12/1998   COLONOSCOPY  03/11/2014   Dr.Wohl   COLONOSCOPY WITH PROPOFOL N/A 12/11/2015   Procedure: COLONOSCOPY WITH PROPOFOL;  Surgeon: Lucilla Lame, MD;  Location: [facility];  Service: Endoscopy;  Laterality: N/A;  DESCENDING COLON POLYP    CORONARY ANGIOPLASTY WITH STENT PLACEMENT  08/12/2008   ESOPHAGOGASTRODUODENOSCOPY  02/21/2014   Dr.Wohl   ESOPHAGOGASTRODUODENOSCOPY (EGD) WITH PROPOFOL N/A 12/11/2015   Procedure: ESOPHAGOGASTRODUODENOSCOPY (EGD) WITH PROPOFOL;  Surgeon: Lucilla Lame, MD;  Location: [facility];  Service: Endoscopy;  Laterality: N/A;   POLYPECTOMY N/A 12/11/2015   Procedure: POLYPECTOMY;  Surgeon: Lucilla Lame, MD;  Location: [facility];  Service: Endoscopy;  Laterality: N/A;   TONSILLECTOMY     TONSILLECTOMY AND ADENOIDECTOMY  08/13/1967   Family History  Problem Relation Age of Onset   Cirrhosis Mother    Hepatitis Mother    Thalassemia Mother    Diabetes Mother    Heart disease Father    Diabetes Father    Hyperlipidemia Father    Hypertension Father    Stroke Father    Cancer Sister        lung  Diabetes Sister    Stroke Sister        during a surgery   Diabetes Brother    Heart disease Brother    Hypertension Brother    Stroke Brother    Thalassemia Maternal Grandmother    COPD Neg Hx    Social History   Tobacco Use   Smoking status: Former    Packs/day: 0.50    Years: 1.00    Pack years: 0.50    Types: Cigarettes    Quit date: 1972    Years since quitting: 51.1   Smokeless tobacco: Never   Tobacco comments:    smoked occasionally  Vaping Use   Vaping Use: Never used  Substance Use Topics   Alcohol use: Yes    Alcohol/week: 1.0 standard drink    Types: 1 Shots of liquor per week    Comment: socially   Drug use: No    Pertinent Clinical Results:   LABS: Labs reviewed: Acceptable for surgery.  Hospital Outpatient Visit on 10/01/2021  Component Date Value Ref Range Status   Sodium 10/01/2021 134 (L)  135 - 145 mmol/L Final   Potassium 10/01/2021 4.1  3.5 - 5.1 mmol/L Final   Chloride 10/01/2021 101  98 - 111 mmol/L Final   CO2 10/01/2021 26  22 - 32 mmol/L Final   Glucose, Bld 10/01/2021 113 (H)  70 - 99 mg/dL Final   Glucose reference range applies only to samples taken after fasting for at least 8 hours.   BUN 10/01/2021 28 (H)  8 - 23 mg/dL Final   Creatinine, Ser 10/01/2021 1.10 (H)  0.44 - 1.00 mg/dL Final   Calcium 10/01/2021 8.9  8.9 - 10.3 mg/dL Final   GFR, Estimated 10/01/2021 54 (L)  >60 mL/min Final   Comment: (NOTE) Calculated using the CKD-EPI Creatinine Equation (2021)    Anion gap 10/01/2021 7  5 - 15 Final   Performed at [facility], [address] [place], [place] [address]     Ref Range & Units 07/23/2021  WBC (White Blood Cell Count) 4.1 - 10.2 103/uL 9.3   RBC (Red Blood Cell Count) 4.04 - 5.48 106/uL 4.97   Hemoglobin 12.0 - 15.0 gm/dL 10.6 Low    Hematocrit 35.0 - 47.0 % 35.1   MCV (Mean Corpuscular Volume) 80.0 - 100.0 fl 70.6 Low    MCH (Mean Corpuscular Hemoglobin) 27.0 - 31.2 pg 21.3 Low    MCHC (Mean Corpuscular Hemoglobin Concentration) 32.0 - 36.0 gm/dL 30.2 Low    Platelet Count 150 - 450 103/uL 265   RDW-CV (Red Cell Distribution Width) 11.6 - 14.8 % 16.8 High    MPV (Mean Platelet Volume) 9.4 - 12.4 fl 10.0   Neutrophils 1.50 - 7.80 103/uL 6.05   Lymphocytes 1.00 - 3.60 103/uL 2.43   Monocytes 0.00 - 1.50 103/uL 0.65   Eosinophils 0.00 - 0.55 103/uL 0.08   Basophils 0.00 - 0.09 103/uL 0.09   Neutrophil % 32.0 - 70.0 % 64.8   Lymphocyte % 10.0 - 50.0 % 26.1   Monocyte % 4.0 - 13.0 % 7.0   Eosinophil % 1.0 - 5.0 % 0.9 Low    Basophil% 0.0 - 2.0 % 1.0   Immature Granulocyte % <=0.7 % 0.2   Immature Granulocyte Count <=0.06 10^3/L 0.02   Resulting Agency  [facility] - LAB  Specimen Collected: 07/23/21 09:19 Last Resulted: 07/23/21 10:12  Received From: [facility]  Result Received: 07/27/21 10:37  ECG: Date: 10/03/2021 Time ECG obtained: 0908 AM Rate: 73 bpm Rhythm: normal sinus Axis (leads I and aVF): Normal Intervals: PR 188 ms. QRS 100 ms. QTc 403 ms. ST segment and T wave changes: No evidence of acute ST segment elevation or depression.  Evidence of an age undetermined septal infarct present. Comparison: Similar to previous tracing obtained on 10/15/2016   IMAGING / PROCEDURES: TRANSTHORACIC ECHOCARDIOGRAM performed on 04/04/2020 LVEF >55% Normal left ventricular systolic function with mild LVH Right ventricular systolic function Trivial MR Mild AR and TR No PR No valvular stenosis No evidence of pericardial effusion  MYOCARDIAL PERFUSION IMAGING STUDY (LEXISCAN) performed on 02/27/2016 LVEF 72% Normal myocardial thickening motion No artifact. Left ventricular cavity size normal No evidence of stress-induced myocardial ischemia or arrhythmia Study determined to be normal and low risk  Impression and Plan:  Lydia Elliott has been referred for pre-anesthesia review and clearance prior to her undergoing the planned anesthetic and procedural courses. Available labs, pertinent testing, and imaging results were personally reviewed by me. This patient has been appropriately cleared by cardiology with an overall ACCEPTBALE risk of significant perioperative cardiovascular complications.  Based on clinical review performed today (10/03/21), barring any significant acute changes in the patient's overall condition, it is anticipated that she will be able to proceed with the planned surgical intervention. Any acute changes in clinical condition may necessitate her procedure being postponed and/or cancelled. Patient will meet with anesthesia team (MD and/or CRNA) on the day of her procedure for preoperative  evaluation/assessment. Questions regarding anesthetic course will be fielded at that time.   Pre-surgical instructions were reviewed with the patient during her PAT appointment and questions were fielded by PAT clinical staff. Patient was advised that if any questions or concerns arise prior to her procedure then she should return a call to PAT and/or her surgeon's office to discuss.  Honor Loh, MSN, APRN, FNP-C, CEN [facility]  Peri-operative Services Nurse Practitioner Phone: [phone] Fax: [phone] 10/03/21 5:59 PM  NOTE: This note has been prepared using Dragon dictation software. Despite my best ability to proofread, there is always the potential that unintentional transcriptional errors may still occur from this process.

## 2021-10-04 ENCOUNTER — Ambulatory Visit: Payer: PPO | Admitting: Urgent Care

## 2021-10-04 ENCOUNTER — Other Ambulatory Visit: Payer: Self-pay

## 2021-10-04 ENCOUNTER — Encounter: Payer: Self-pay | Admitting: Orthopedic Surgery

## 2021-10-04 ENCOUNTER — Encounter
Admission: RE | Disposition: A | Discharge status: Home / Self Care | Payer: Self-pay | Source: Ambulatory Visit | Attending: Orthopedic Surgery

## 2021-10-04 ENCOUNTER — Ambulatory Visit
Admission: RE | Admit: 2021-10-04 | Discharge: 2021-10-04 | Disposition: A | Discharge status: Home / Self Care | Payer: PPO | Source: Ambulatory Visit | Attending: Orthopedic Surgery | Admitting: Orthopedic Surgery

## 2021-10-04 DIAGNOSIS — M659 Synovitis and tenosynovitis, unspecified: Secondary | ICD-10-CM | POA: Diagnosis not present

## 2021-10-04 DIAGNOSIS — M94261 Chondromalacia, right knee: Secondary | ICD-10-CM | POA: Insufficient documentation

## 2021-10-04 DIAGNOSIS — F418 Other specified anxiety disorders: Secondary | ICD-10-CM | POA: Diagnosis not present

## 2021-10-04 DIAGNOSIS — M069 Rheumatoid arthritis, unspecified: Secondary | ICD-10-CM | POA: Diagnosis not present

## 2021-10-04 DIAGNOSIS — K219 Gastro-esophageal reflux disease without esophagitis: Secondary | ICD-10-CM | POA: Diagnosis not present

## 2021-10-04 DIAGNOSIS — E785 Hyperlipidemia, unspecified: Secondary | ICD-10-CM | POA: Diagnosis not present

## 2021-10-04 DIAGNOSIS — Z87891 Personal history of nicotine dependence: Secondary | ICD-10-CM | POA: Insufficient documentation

## 2021-10-04 DIAGNOSIS — I2510 Atherosclerotic heart disease of native coronary artery without angina pectoris: Secondary | ICD-10-CM | POA: Insufficient documentation

## 2021-10-04 DIAGNOSIS — S83281A Other tear of lateral meniscus, current injury, right knee, initial encounter: Secondary | ICD-10-CM | POA: Diagnosis not present

## 2021-10-04 DIAGNOSIS — X58XXXA Exposure to other specified factors, initial encounter: Secondary | ICD-10-CM | POA: Diagnosis not present

## 2021-10-04 DIAGNOSIS — M65861 Other synovitis and tenosynovitis, right lower leg: Secondary | ICD-10-CM | POA: Diagnosis not present

## 2021-10-04 DIAGNOSIS — I10 Essential (primary) hypertension: Secondary | ICD-10-CM | POA: Insufficient documentation

## 2021-10-04 DIAGNOSIS — M222X1 Patellofemoral disorders, right knee: Secondary | ICD-10-CM | POA: Diagnosis not present

## 2021-10-04 HISTORY — DX: Unspecified cataract: H26.9

## 2021-10-04 HISTORY — DX: Arthropathic psoriasis, unspecified: L40.50

## 2021-10-04 HISTORY — PX: KNEE ARTHROSCOPY: SHX127

## 2021-10-04 HISTORY — DX: Anemia, unspecified: D64.9

## 2021-10-04 HISTORY — DX: Atherosclerosis of aorta: I70.0

## 2021-10-04 HISTORY — DX: Irritable bowel syndrome, unspecified: K58.9

## 2021-10-04 HISTORY — DX: Takotsubo syndrome: I51.81

## 2021-10-04 SURGERY — ARTHROSCOPY, KNEE
Anesthesia: General | Site: Knee | Laterality: Right

## 2021-10-04 MED ORDER — ORAL CARE MOUTH RINSE: 15.0000 mL | Freq: Once | OROMUCOSAL | Status: AC

## 2021-10-04 MED ORDER — DOCUSATE SODIUM 100 MG PO CAPS: 100.0000 mg | ORAL_CAPSULE | Freq: Every day | ORAL | 2 refills | Status: AC | PRN

## 2021-10-04 MED ORDER — MIDAZOLAM HCL 2 MG/2ML IJ SOLN
INTRAMUSCULAR | Status: DC | PRN
  Administered 2021-10-04: 2 mg via INTRAVENOUS

## 2021-10-04 MED ORDER — PHENYLEPHRINE HCL-NACL 20-0.9 MG/250ML-% IV SOLN
INTRAVENOUS | Status: AC
  Filled 2021-10-04: qty 250

## 2021-10-04 MED ORDER — BUPIVACAINE-EPINEPHRINE 0.25% -1:200000 IJ SOLN
INTRAMUSCULAR | Status: DC | PRN
  Administered 2021-10-04: 30 mL

## 2021-10-04 MED ORDER — EPINEPHRINE PF 1 MG/ML IJ SOLN
INTRAMUSCULAR | Status: AC
  Filled 2021-10-04: qty 1

## 2021-10-04 MED ORDER — PROPOFOL 10 MG/ML IV BOLUS
INTRAVENOUS | Status: DC | PRN
  Administered 2021-10-04: 130 mg via INTRAVENOUS

## 2021-10-04 MED ORDER — PHENYLEPHRINE HCL-NACL 20-0.9 MG/250ML-% IV SOLN
INTRAVENOUS | Status: DC | PRN
  Administered 2021-10-04: 30 ug/min via INTRAVENOUS

## 2021-10-04 MED ORDER — CEFAZOLIN SODIUM-DEXTROSE 2-4 GM/100ML-% IV SOLN
INTRAVENOUS | Status: AC
  Filled 2021-10-04: qty 100

## 2021-10-04 MED ORDER — CHLORHEXIDINE GLUCONATE 0.12 % MT SOLN: 15.0000 mL | Freq: Once | OROMUCOSAL | Status: AC

## 2021-10-04 MED ORDER — ACETAMINOPHEN 500 MG PO TABS
ORAL_TABLET | ORAL | Status: AC
  Administered 2021-10-04: 1000 mg via ORAL
  Filled 2021-10-04: qty 2

## 2021-10-04 MED ORDER — LIDOCAINE HCL (PF) 1 % IJ SOLN
INTRAMUSCULAR | Status: AC
  Filled 2021-10-04: qty 30

## 2021-10-04 MED ORDER — EPINEPHRINE PF 1 MG/ML IJ SOLN
INTRAMUSCULAR | Status: AC
Start: 2021-10-04 — End: ?
  Filled 2021-10-04: qty 4

## 2021-10-04 MED ORDER — CEFAZOLIN SODIUM-DEXTROSE 2-4 GM/100ML-% IV SOLN
2.0000 g | INTRAVENOUS | Status: AC
  Administered 2021-10-04: 2 g via INTRAVENOUS

## 2021-10-04 MED ORDER — LIDOCAINE HCL (CARDIAC) PF 100 MG/5ML IV SOSY
PREFILLED_SYRINGE | INTRAVENOUS | Status: DC | PRN
  Administered 2021-10-04: 100 mg via INTRAVENOUS

## 2021-10-04 MED ORDER — DEXMEDETOMIDINE (PRECEDEX) IN NS 20 MCG/5ML (4 MCG/ML) IV SYRINGE
PREFILLED_SYRINGE | INTRAVENOUS | Status: DC | PRN
  Administered 2021-10-04 (×2): 4 ug via INTRAVENOUS

## 2021-10-04 MED ORDER — EPINEPHRINE PF 1 MG/ML IJ SOLN
INTRAMUSCULAR | Status: DC | PRN
  Administered 2021-10-04: 4 mg

## 2021-10-04 MED ORDER — ASPIRIN EC 325 MG PO TBEC: 325.0000 mg | DELAYED_RELEASE_TABLET | Freq: Every day | ORAL | 0 refills | Status: DC

## 2021-10-04 MED ORDER — CHLORHEXIDINE GLUCONATE CLOTH 2 % EX PADS: 6.0000 | MEDICATED_PAD | Freq: Once | CUTANEOUS | Status: DC

## 2021-10-04 MED ORDER — PROPOFOL 10 MG/ML IV BOLUS
INTRAVENOUS | Status: AC
  Filled 2021-10-04: qty 20

## 2021-10-04 MED ORDER — FENTANYL CITRATE (PF) 100 MCG/2ML IJ SOLN: 25.0000 ug | INTRAMUSCULAR | Status: DC | PRN

## 2021-10-04 MED ORDER — CHLORHEXIDINE GLUCONATE 0.12 % MT SOLN
OROMUCOSAL | Status: AC
  Administered 2021-10-04: 15 mL via OROMUCOSAL
  Filled 2021-10-04: qty 15

## 2021-10-04 MED ORDER — CHLORHEXIDINE GLUCONATE CLOTH 2 % EX PADS
6.0000 | MEDICATED_PAD | Freq: Once | CUTANEOUS | Status: AC
  Administered 2021-10-04: 6 via TOPICAL

## 2021-10-04 MED ORDER — NEOMYCIN-POLYMYXIN B GU 40-200000 IR SOLN
Status: AC
  Filled 2021-10-04: qty 2

## 2021-10-04 MED ORDER — LIDOCAINE HCL 1 % IJ SOLN
INTRAMUSCULAR | Status: DC | PRN
Start: 2021-10-04 — End: 2021-10-04
  Administered 2021-10-04: 3 mL

## 2021-10-04 MED ORDER — FENTANYL CITRATE (PF) 100 MCG/2ML IJ SOLN
INTRAMUSCULAR | Status: DC | PRN
  Administered 2021-10-04 (×4): 25 ug via INTRAVENOUS

## 2021-10-04 MED ORDER — LACTATED RINGERS IV SOLN: INTRAVENOUS | Status: DC

## 2021-10-04 MED ORDER — DEXAMETHASONE SODIUM PHOSPHATE 10 MG/ML IJ SOLN
INTRAMUSCULAR | Status: DC | PRN
  Administered 2021-10-04: 10 mg via INTRAVENOUS

## 2021-10-04 MED ORDER — GLYCOPYRROLATE 0.2 MG/ML IJ SOLN
INTRAMUSCULAR | Status: DC | PRN
  Administered 2021-10-04: .2 mg via INTRAVENOUS

## 2021-10-04 MED ORDER — PHENYLEPHRINE 40 MCG/ML (10ML) SYRINGE FOR IV PUSH (FOR BLOOD PRESSURE SUPPORT)
PREFILLED_SYRINGE | INTRAVENOUS | Status: DC | PRN
  Administered 2021-10-04 (×2): 80 ug via INTRAVENOUS
  Administered 2021-10-04: 40 ug via INTRAVENOUS
  Administered 2021-10-04: 80 ug via INTRAVENOUS

## 2021-10-04 MED ORDER — BUPIVACAINE-EPINEPHRINE (PF) 0.25% -1:200000 IJ SOLN
INTRAMUSCULAR | Status: AC
  Filled 2021-10-04: qty 30

## 2021-10-04 MED ORDER — RINGERS IRRIGATION IR SOLN
Status: DC | PRN
Start: 2021-10-04 — End: 2021-10-04
  Administered 2021-10-04: 12000 mL

## 2021-10-04 MED ORDER — MIDAZOLAM HCL 2 MG/2ML IJ SOLN
INTRAMUSCULAR | Status: AC
  Filled 2021-10-04: qty 2

## 2021-10-04 MED ORDER — FENTANYL CITRATE (PF) 100 MCG/2ML IJ SOLN
INTRAMUSCULAR | Status: AC
  Filled 2021-10-04: qty 2

## 2021-10-04 MED ORDER — ACETAMINOPHEN 500 MG PO TABS: 1000.0000 mg | ORAL_TABLET | ORAL | Status: AC

## 2021-10-04 MED ORDER — HYDROCODONE-ACETAMINOPHEN 5-325 MG PO TABS: 1.0000 | ORAL_TABLET | ORAL | 0 refills | Status: DC | PRN

## 2021-10-04 MED ORDER — ONDANSETRON HCL 4 MG/2ML IJ SOLN
INTRAMUSCULAR | Status: DC | PRN
  Administered 2021-10-04: 4 mg via INTRAVENOUS

## 2021-10-04 MED ORDER — ONDANSETRON HCL 4 MG/2ML IJ SOLN: 4.0000 mg | Freq: Once | INTRAMUSCULAR | Status: DC | PRN

## 2021-10-04 SURGICAL SUPPLY — 49 items
ADAPTER IRRIG TUBE 2 SPIKE SOL (ADAPTER) ×2 IMPLANT
BLADE FULL RADIUS 3.5 (BLADE) IMPLANT
BLADE SHAVER 4.5X7 STR FR (MISCELLANEOUS) ×1 IMPLANT
BUR BR 5.5 WIDE MOUTH (BURR) IMPLANT
CNTNR SPEC 2.5X3XGRAD LEK (MISCELLANEOUS)
CONT SPEC 4OZ STER OR WHT (MISCELLANEOUS)
CONTAINER SPEC 2.5X3XGRAD LEK (MISCELLANEOUS) IMPLANT
COOLER POLAR GLACIER W/PUMP (MISCELLANEOUS) IMPLANT
CUFF TOURN SGL QUICK 24 (TOURNIQUET CUFF) ×1
CUFF TOURN SGL QUICK 34 (TOURNIQUET CUFF)
CUFF TRNQT CYL 24X4X16.5-23 (TOURNIQUET CUFF) IMPLANT
CUFF TRNQT CYL 34X4.125X (TOURNIQUET CUFF) IMPLANT
DEVICE SUCT BLK HOLE OR FLOOR (MISCELLANEOUS) IMPLANT
DRAPE ARTHRO LIMB 89X125 STRL (DRAPES) ×2 IMPLANT
DRAPE IMP U-DRAPE 54X76 (DRAPES) ×2 IMPLANT
DURAPREP 26ML APPLICATOR (WOUND CARE) ×6 IMPLANT
GAUZE SPONGE 4X4 12PLY STRL (GAUZE/BANDAGES/DRESSINGS) ×2 IMPLANT
GAUZE XEROFORM 1X8 LF (GAUZE/BANDAGES/DRESSINGS) ×2 IMPLANT
GLOVE SURG ORTHO LTX SZ9 (GLOVE) ×4 IMPLANT
GLOVE SURG UNDER POLY LF SZ9 (GLOVE) ×2 IMPLANT
GOWN STRL REUS TWL 2XL XL LVL4 (GOWN DISPOSABLE) ×2 IMPLANT
GOWN STRL REUS W/ TWL LRG LVL3 (GOWN DISPOSABLE) ×1 IMPLANT
GOWN STRL REUS W/TWL LRG LVL3 (GOWN DISPOSABLE) ×1
IV LACTATED RINGER IRRG 3000ML (IV SOLUTION) ×4
IV LR IRRIG 3000ML ARTHROMATIC (IV SOLUTION) ×4 IMPLANT
KIT TURNOVER KIT A (KITS) ×2 IMPLANT
MANIFOLD NEPTUNE II (INSTRUMENTS) ×4 IMPLANT
MAT ABSORB  FLUID 56X50 GRAY (MISCELLANEOUS) ×2
MAT ABSORB FLUID 56X50 GRAY (MISCELLANEOUS) ×2 IMPLANT
NEEDLE HYPO 22GX1.5 SAFETY (NEEDLE) ×2 IMPLANT
PACK ARTHROSCOPY KNEE (MISCELLANEOUS) ×2 IMPLANT
PAD ABD DERMACEA PRESS 5X9 (GAUZE/BANDAGES/DRESSINGS) ×3 IMPLANT
PAD WRAPON POLAR KNEE (MISCELLANEOUS) ×1 IMPLANT
SHAVER BLADE BONE CUTTER  5.5 (BLADE)
SHAVER BLADE BONE CUTTER 4.5 (BLADE) ×1 IMPLANT
SHAVER BLADE BONE CUTTER 5.5 (BLADE) IMPLANT
SHAVER BLADE TAPERED BLUNT 4 (BLADE) ×2 IMPLANT
SOL PREP PVP 2OZ (MISCELLANEOUS)
SOLUTION PREP PVP 2OZ (MISCELLANEOUS) IMPLANT
SPONGE T-LAP 18X18 ~~LOC~~+RFID (SPONGE) ×2 IMPLANT
STRIP CLOSURE SKIN 1/2X4 (GAUZE/BANDAGES/DRESSINGS) ×2 IMPLANT
SUT ETHILON 4-0 (SUTURE) ×1
SUT ETHILON 4-0 FS2 18XMFL BLK (SUTURE) ×1
SUTURE ETHLN 4-0 FS2 18XMF BLK (SUTURE) ×1 IMPLANT
TUBING INFLOW SET DBFLO PUMP (TUBING) ×2 IMPLANT
TUBING OUTFLOW SET DBLFO PUMP (TUBING) ×2 IMPLANT
WAND WEREWOLF FLOW 90D (MISCELLANEOUS) ×2 IMPLANT
WATER STERILE IRR 500ML POUR (IV SOLUTION) ×1 IMPLANT
WRAPON POLAR PAD KNEE (MISCELLANEOUS) ×2

## 2021-10-04 NOTE — Transfer of Care (Signed)
Immediate Anesthesia Transfer of Care Note  Patient: Lydia Elliott  Procedure(s) Performed: ARTHROSCOPY KNEE (Right: Knee)  Patient Location: PACU  Anesthesia Type:General  Level of Consciousness: drowsy  Airway & Oxygen Therapy: Patient Spontanous Breathing and Patient connected to face mask oxygen  Post-op Assessment: Report given to RN and Post -op Vital signs reviewed and stable  Post vital signs: Reviewed and stable  Last Vitals:  Vitals Value Taken Time  BP 157/63   Temp    Pulse 84 10/04/21 0916  Resp 13 10/04/21 0916  SpO2 98 % 10/04/21 0916  Vitals shown include unvalidated device data.  Last Pain:  Vitals:   10/04/21 0713  TempSrc: Temporal  PainSc: 0-No pain         Complications: No notable events documented.

## 2021-10-04 NOTE — Anesthesia Preprocedure Evaluation (Addendum)
Anesthesia Evaluation  Patient identified by MRN, date of birth, ID band Patient awake    Reviewed: Allergy & Precautions, H&P , NPO status , Patient's Chart, lab work & pertinent test results, reviewed documented beta blocker date and time   History of Anesthesia Complications Negative for: history of anesthetic complications  Airway Mallampati: III  TM Distance: >3 FB Neck ROM: full    Dental  (+) Dental Advidsory Given, Teeth Intact, Caps   Pulmonary neg pulmonary ROS, former smoker,    Pulmonary exam normal breath sounds clear to auscultation       Cardiovascular Exercise Tolerance: Good hypertension, (-) angina+ CAD and + Cardiac Stents  (-) Past MI Normal cardiovascular exam(-) dysrhythmias (-) Valvular Problems/Murmurs Rhythm:regular Rate:Normal     Neuro/Psych PSYCHIATRIC DISORDERS Anxiety Depression negative neurological ROS     GI/Hepatic Neg liver ROS, GERD  ,  Endo/Other  negative endocrine ROS  Renal/GU negative Renal ROS  negative genitourinary   Musculoskeletal   Abdominal   Peds  Hematology negative hematology ROS (+)   Anesthesia Other Findings Past Medical History: No date: Anemia No date: Anxiety     Comment:  a.) on BZO No date: Aortic atherosclerosis (HCC) No date: Arthritis     Comment:  hands, knees, toes 10/15/2016: Bone island     Comment:  a.) RIGHT humeral head No date: CAD (coronary artery disease)     Comment:  a.) 2010 --> PCI with stent placement x 1 (type location              unknown) No date: Cataract No date: Depression No date: GERD (gastroesophageal reflux disease) No date: Hyperlipidemia No date: Hypertension No date: IBS (irritable bowel syndrome) No date: Insomnia     Comment:  a.) on zolpidem 10/18/2016: Nodule of right lung No date: Psoriatic arthritis ([place]) No date: Takotsubo cardiomyopathy No date: Thalassemia No date: Vertigo     Comment:  none - 10  yrs No date: Wears dentures     Comment:  partial lower   Reproductive/Obstetrics negative OB ROS                            Anesthesia Physical Anesthesia Plan  ASA: 3  Anesthesia Plan: General   Post-op Pain Management:    Induction: Intravenous  PONV Risk Score and Plan: 3 and Ondansetron, Dexamethasone and Treatment may vary due to age or medical condition  Airway Management Planned: LMA  Additional Equipment:   Intra-op Plan:   Post-operative Plan: Extubation in OR  Informed Consent: I have reviewed the patients History and Physical, chart, labs and discussed the procedure including the risks, benefits and alternatives for the proposed anesthesia with the patient or authorized representative who has indicated his/her understanding and acceptance.     Dental Advisory Given  Plan Discussed with: Anesthesiologist, CRNA and Surgeon  Anesthesia Plan Comments:        Anesthesia Quick Evaluation

## 2021-10-04 NOTE — Anesthesia Procedure Notes (Signed)
Procedure Name: LMA Insertion Date/Time: 10/04/2021 7:55 AM Performed by: Lily Peer, Janyah Singleterry, CRNA Pre-anesthesia Checklist: Patient identified, Emergency Drugs available, Suction available and Patient being monitored Patient Re-evaluated:Patient Re-evaluated prior to induction Oxygen Delivery Method: Circle system utilized Preoxygenation: Pre-oxygenation with 100% oxygen Induction Type: IV induction Ventilation: Mask ventilation without difficulty LMA: LMA inserted LMA Size: 3.0 Tube type: Oral Number of attempts: 1 Placement Confirmation: breath sounds checked- equal and bilateral and positive ETCO2 Tube secured with: Tape Dental Injury: Teeth and Oropharynx as per pre-operative assessment

## 2021-10-04 NOTE — Op Note (Signed)
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 female with a preop diagnosis of right knee rheumatoid arthritis with synovitis who failed conservative management and elected for an arthroscopic partial synovectomy.  The risks benefits and alternatives were discussed with the patient preoperatively including the risks of infection, bleeding, nerve injury, knee stiffness, persistent pain, osteoarthritis and the need for further surgery. Medical  risks include DVT and pulmonary embolism, myocardial infarction, stroke, pneumonia, respiratory failure and death. The patient understood these risks and wished to proceed.  OPERATIVE FINDINGS: Advanced tricompartmental chondromalacia most extensive in the medial femoral condyle, patella and trochlea.  Patient had a small lateral meniscus tear within the body.  Patient had mild synovitis.  OPERATIVE PROCEDURE: Patient was met in the preoperative area. The operative extremity was signed with the word yes and my initials according the hospital's correct site of surgery protocol.  The patient was brought to the operating room where they was placed supine on the operative table. General anesthesia was administered. The patient was prepped and draped in a sterile fashion.  A timeout was performed to verify the patient's name, date of birth, medical record number, correct site of surgery correct procedure to be performed. It was also used to verify the patient received antibiotics that all appropriate instruments, and  radiographic studies were available in the room. Once all in attendance were in agreement, the case began.  Proposed arthroscopy incisions were drawn out with a surgical marker. These were pre-injected with 1% lidocaine plain. An 11 blade was used to establish an inferior lateral and inferomedial portals. The inferomedial portal was created using a 18-gauge spinal needle under direct visualization.  A full diagnostic examination of the knee was performed including the suprapatellar pouch, patellofemoral joint, medial lateral compartments as well as the medial lateral gutters, the intercondylar notch in the posterior knee.  Findings on arthroscopy are noted above.  Patient had large loose chondral flaps in the medial femoral condyle and trochlea.  There were extensive regions of the medial femoral condyle and undersurface of the patella devoid of articular cartilage.  A Dyonics full-radius 4.5 mm shaver blade was used to debride loose chondral flaps.  An arthroscopic chondroplasty was performed of the trochlea as well along with debridement of loose flaps as well.  Patient then had her knee placed in a figure-of-four position.  The lateral meniscal tear treated with a straight biter and Dyonics flyer shaver blade and straight duckbill basket. The meniscus was debrided until a stable meniscal rim was achieved. A partial synovectomy was also performed using a 4-0 resector shaver blade and 90 ArthroCare wand along with a limited lateral release.  Patient had significant narrowing between the lateral facet of the patella and lateral trochlea due to a tight lateral retinaculum.  The knee was then copiously lavaged. All arthroscopic instruments were removed. The 2 arthroscopy portals were closed with 4-0 nylon.  Patient was injected intra-articularly with 30 cc of 0.5% Marcaine with epi to help with postop pain control.  Steri-Strips were applied along with a dry sterile and compressive dressing. The patient  was brought to the PACU in stable condition. I was scrubbed and present  for the entire case and all sharp and instrument counts were correct at the conclusion the case. I spoke with the patient's friend in postoperatively to let them know the case was performed without complication and the patient was stable in the recovery room.    Timoteo Gaul, MD

## 2021-10-04 NOTE — H&P (Signed)
PREOPERATIVE H&P  Chief Complaint: Right Knee Synovitis  HPI: Lydia Elliott is a 71 y.o. female who presents for preoperative history and physical with a diagnosis of Right Knee Synovitis. Symptoms of right knee pain and swelling are significantly impairing activities of daily living.  She has failed non-operative management and wished to proceed with right knee arthroscopy.   Past Medical History:  Diagnosis Date   Anemia    Anxiety    a.) on BZO   Aortic atherosclerosis ([place])    Arthritis    hands, knees, toes   Bone island 10/15/2016   a.) RIGHT humeral head   CAD (coronary artery disease)    a.) 2010 --> PCI with stent placement x 1 (type location unknown)   Cataract    Depression    GERD (gastroesophageal reflux disease)    Hyperlipidemia    Hypertension    IBS (irritable bowel syndrome)    Insomnia    a.) on zolpidem   Nodule of right lung 10/18/2016   Psoriatic arthritis ([place])    Takotsubo cardiomyopathy    Thalassemia    Vertigo    none - 10 yrs   Wears dentures    partial lower   Past Surgical History:  Procedure Laterality Date   APPENDECTOMY  08/12/1965   CARPAL TUNNEL RELEASE  08/12/2005   CATARACT EXTRACTION  08/13/2011   CHOLECYSTECTOMY  08/12/1998   COLONOSCOPY  03/11/2014   Dr.Wohl   COLONOSCOPY WITH PROPOFOL N/A 12/11/2015   Procedure: COLONOSCOPY WITH PROPOFOL;  Surgeon: Lucilla Lame, MD;  Location: [place];  Service: Endoscopy;  Laterality: N/A;  DESCENDING COLON POLYP    CORONARY ANGIOPLASTY WITH STENT PLACEMENT  08/12/2008   ESOPHAGOGASTRODUODENOSCOPY  02/21/2014   Dr.Wohl   ESOPHAGOGASTRODUODENOSCOPY (EGD) WITH PROPOFOL N/A 12/11/2015   Procedure: ESOPHAGOGASTRODUODENOSCOPY (EGD) WITH PROPOFOL;  Surgeon: Lucilla Lame, MD;  Location: [place];  Service: Endoscopy;  Laterality: N/A;   POLYPECTOMY N/A 12/11/2015   Procedure: POLYPECTOMY;  Surgeon: Lucilla Lame, MD;  Location: [place];  Service: Endoscopy;   Laterality: N/A;   TONSILLECTOMY     TONSILLECTOMY AND ADENOIDECTOMY  08/13/1967   Social History   Socioeconomic History   Marital status: Widowed    Spouse name: Not on file   Number of children: Not on file   Years of education: Not on file   Highest education level: Not on file  Occupational History   Not on file  Tobacco Use   Smoking status: Former    Packs/day: 0.50    Years: 1.00    Pack years: 0.50    Types: Cigarettes    Quit date: 25    Years since quitting: 51.1   Smokeless tobacco: Never   Tobacco comments:    smoked occasionally  Vaping Use   Vaping Use: Never used  Substance and Sexual Activity   Alcohol use: Yes    Alcohol/week: 1.0 standard drink    Types: 1 Shots of liquor per week    Comment: socially   Drug use: No   Sexual activity: Yes  Other Topics Concern   Not on file  Social History Narrative   Not on file   Social Determinants of Health   Financial Resource Strain: Not on file  Food Insecurity: Not on file  Transportation Needs: Not on file  Physical Activity: Not on file  Stress: Not on file  Social Connections: Not on file   Family History  Problem Relation Age of Onset  Cirrhosis Mother    Hepatitis Mother    Thalassemia Mother    Diabetes Mother    Heart disease Father    Diabetes Father    Hyperlipidemia Father    Hypertension Father    Stroke Father    Cancer Sister        lung   Diabetes Sister    Stroke Sister        during a surgery   Diabetes Brother    Heart disease Brother    Hypertension Brother    Stroke Brother    Thalassemia Maternal Grandmother    COPD Neg Hx    Allergies  Allergen Reactions   Rosuvastatin     Muscle Pain   Prior to Admission medications   Medication Sig Start Date End Date Taking? Authorizing Provider  ALPRAZolam (XANAX) 0.25 MG tablet Take 0.25 mg by mouth 2 (two) times daily as needed for anxiety.   Yes [provider]  atorvastatin (LIPITOR) 20 MG tablet Take 20  mg by mouth daily. 09/05/21  Yes [provider]  busPIRone (BUSPAR) 5 MG tablet Take 5 mg by mouth daily. 01/25/20  Yes [provider]  carvedilol (COREG) 3.125 MG tablet Take 3.125 mg by mouth 2 (two) times daily with a meal. 03/15/16  Yes [provider]  diclofenac Sodium (VOLTAREN) 1 % GEL Apply 1 application topically 2 (two) times daily as needed (pain).   Yes [provider]  escitalopram (LEXAPRO) 20 MG tablet TAKE 1 TABLET BY MOUTH EVERY DAY 11/10/19  Yes Delsa Grana, PA-C  losartan (COZAAR) 25 MG tablet Take 25 mg by mouth 2 (two) times a day.  08/03/18  Yes [provider]  omeprazole (PRILOSEC) 20 MG capsule Take 20 mg by mouth daily.   Yes [provider]  zolpidem (AMBIEN) 10 MG tablet Take 5 mg by mouth at bedtime as needed for sleep. 05/19/15  Yes [provider]  Adalimumab (HUMIRA) 40 MG/0.8ML PSKT Inject 40 mg into the skin every 14 (fourteen) days.    [provider]     Positive ROS: All other systems have been reviewed and were otherwise negative with the exception of those mentioned in the HPI and as above.  Physical Exam: General: Alert, no acute distress Cardiovascular: Regular rate and rhythm, no murmurs rubs or gallops.  No pedal edema Respiratory: Clear to auscultation bilaterally, no wheezes rales or rhonchi. No cyanosis, no use of accessory musculature GI: No organomegaly, abdomen is soft and non-tender nondistended with positive bowel sounds. Skin: Skin intact, no lesions within the operative field. Neurologic: Sensation intact distally Psychiatric: Patient is competent for consent with normal mood and affect Lymphatic: No cervical lymphadenopathy  MUSCULOSKELETAL: Right Knee: The patient has intact skin. There is no erythema, ecchymosis and only a trace effusion. She can fully extend her right knee and flex to approximately 110-120 degrees. She has tenderness over the medial joint line as well  as patellofemoral crepitus. Distally, she is neurovascularly intact.   Radiology: X-ray films of both knees show mild to moderate medial compartment joint space narrowing. She has some diffuse osteopenia and has narrowing of the patellofemoral compartment in both knees.   Assessment: Right Knee Synovitis  Plan: Plan for Procedure(s): RIGHT ARTHROSCOPIC KNEE DEBRIDEMENT  I have reviewed the details of the operation and post-op course.   A pre-op H&P was performed at the bedside.  The right knee was marked according to hospital's correct site of surgery protocol.  I discussed  the risks and benefits of surgery. The risks include but are not limited to infection, bleeding, nerve or blood vessel injury, joint stiffness or loss of motion, persistent pain, weakness or instability, and hardware failure and the need for further surgery. Patient understood these risks and wished to proceed.     Thornton Park, MD   10/04/2021 7:45 AM

## 2021-10-04 NOTE — Anesthesia Postprocedure Evaluation (Signed)
Anesthesia Post Note  Patient: Shamon Lobo  Procedure(s) Performed: ARTHROSCOPY KNEE (Right: Knee)  Patient location during evaluation: PACU Anesthesia Type: General Level of consciousness: awake and alert Pain management: pain level controlled Vital Signs Assessment: post-procedure vital signs reviewed and stable Respiratory status: spontaneous breathing, nonlabored ventilation, respiratory function stable and patient connected to nasal cannula oxygen Cardiovascular status: blood pressure returned to baseline and stable Postop Assessment: no apparent nausea or vomiting Anesthetic complications: no   No notable events documented.   Last Vitals:  Vitals:   10/04/21 0945 10/04/21 0952  BP: (!) 142/60 132/63  Pulse: 77 77  Resp: 16 15  Temp: 36.7 C 36.7 C  SpO2: 97% 97%    Last Pain:  Vitals:   10/04/21 0952  TempSrc: Temporal  PainSc: 0-No pain                 Martha Clan

## 2021-10-04 NOTE — Discharge Instructions (Signed)

## 2021-10-05 ENCOUNTER — Encounter: Payer: Self-pay | Admitting: Orthopedic Surgery

## 2021-10-12 DIAGNOSIS — M25661 Stiffness of right knee, not elsewhere classified: Secondary | ICD-10-CM | POA: Diagnosis not present

## 2021-10-12 DIAGNOSIS — M25561 Pain in right knee: Secondary | ICD-10-CM | POA: Diagnosis not present

## 2021-10-22 DIAGNOSIS — M25661 Stiffness of right knee, not elsewhere classified: Secondary | ICD-10-CM | POA: Diagnosis not present

## 2021-10-22 DIAGNOSIS — M25561 Pain in right knee: Secondary | ICD-10-CM | POA: Diagnosis not present

## 2021-10-25 DIAGNOSIS — M25561 Pain in right knee: Secondary | ICD-10-CM | POA: Diagnosis not present

## 2021-10-25 DIAGNOSIS — M25661 Stiffness of right knee, not elsewhere classified: Secondary | ICD-10-CM | POA: Diagnosis not present

## 2021-10-29 ENCOUNTER — Other Ambulatory Visit: Payer: Self-pay | Admitting: Orthopedic Surgery

## 2021-10-29 DIAGNOSIS — Z01818 Encounter for other preprocedural examination: Secondary | ICD-10-CM

## 2021-10-30 ENCOUNTER — Ambulatory Visit: Payer: PPO | Admitting: Certified Registered Nurse Anesthetist

## 2021-10-30 ENCOUNTER — Ambulatory Visit
Admission: RE | Admit: 2021-10-30 | Discharge: 2021-10-30 | Disposition: A | Discharge status: Home / Self Care | Payer: PPO | Attending: Orthopedic Surgery | Admitting: Orthopedic Surgery

## 2021-10-30 ENCOUNTER — Encounter: Payer: Self-pay | Admitting: Orthopedic Surgery

## 2021-10-30 ENCOUNTER — Encounter
Admission: RE | Disposition: A | Discharge status: Home / Self Care | Payer: Self-pay | Source: Home / Self Care | Attending: Orthopedic Surgery

## 2021-10-30 ENCOUNTER — Other Ambulatory Visit: Payer: Self-pay

## 2021-10-30 DIAGNOSIS — M1711 Unilateral primary osteoarthritis, right knee: Secondary | ICD-10-CM | POA: Diagnosis not present

## 2021-10-30 DIAGNOSIS — I2510 Atherosclerotic heart disease of native coronary artery without angina pectoris: Secondary | ICD-10-CM | POA: Insufficient documentation

## 2021-10-30 DIAGNOSIS — I10 Essential (primary) hypertension: Secondary | ICD-10-CM | POA: Insufficient documentation

## 2021-10-30 DIAGNOSIS — Z01818 Encounter for other preprocedural examination: Secondary | ICD-10-CM

## 2021-10-30 DIAGNOSIS — Y838 Other surgical procedures as the cause of abnormal reaction of the patient, or of later complication, without mention of misadventure at the time of the procedure: Secondary | ICD-10-CM | POA: Diagnosis not present

## 2021-10-30 DIAGNOSIS — L089 Local infection of the skin and subcutaneous tissue, unspecified: Secondary | ICD-10-CM | POA: Diagnosis not present

## 2021-10-30 DIAGNOSIS — Z87891 Personal history of nicotine dependence: Secondary | ICD-10-CM | POA: Insufficient documentation

## 2021-10-30 DIAGNOSIS — K219 Gastro-esophageal reflux disease without esophagitis: Secondary | ICD-10-CM | POA: Diagnosis not present

## 2021-10-30 DIAGNOSIS — Z96651 Presence of right artificial knee joint: Secondary | ICD-10-CM | POA: Insufficient documentation

## 2021-10-30 DIAGNOSIS — F32A Depression, unspecified: Secondary | ICD-10-CM | POA: Insufficient documentation

## 2021-10-30 DIAGNOSIS — T8141XA Infection following a procedure, superficial incisional surgical site, initial encounter: Secondary | ICD-10-CM | POA: Insufficient documentation

## 2021-10-30 DIAGNOSIS — F419 Anxiety disorder, unspecified: Secondary | ICD-10-CM | POA: Diagnosis not present

## 2021-10-30 DIAGNOSIS — Z955 Presence of coronary angioplasty implant and graft: Secondary | ICD-10-CM | POA: Insufficient documentation

## 2021-10-30 HISTORY — PX: KNEE ARTHROSCOPY: SHX127

## 2021-10-30 LAB — CBC WITH DIFFERENTIAL/PLATELET
Abs Immature Granulocytes: 0.02 10*3/uL (ref 0.00–0.07)
Basophils Absolute: 0.1 10*3/uL (ref 0.0–0.1)
Basophils Relative: 2 %
Eosinophils Absolute: 0.1 10*3/uL (ref 0.0–0.5)
Eosinophils Relative: 2 %
HCT: 32.6 % — ABNORMAL LOW (ref 36.0–46.0)
Hemoglobin: 9.6 g/dL — ABNORMAL LOW (ref 12.0–15.0)
Immature Granulocytes: 0 %
Lymphocytes Relative: 21 %
Lymphs Abs: 1.2 10*3/uL (ref 0.7–4.0)
MCH: 20 pg — ABNORMAL LOW (ref 26.0–34.0)
MCHC: 29.4 g/dL — ABNORMAL LOW (ref 30.0–36.0)
MCV: 67.8 fL — ABNORMAL LOW (ref 80.0–100.0)
Monocytes Absolute: 0.4 10*3/uL (ref 0.1–1.0)
Monocytes Relative: 8 %
Neutro Abs: 3.8 10*3/uL (ref 1.7–7.7)
Neutrophils Relative %: 67 %
Platelets: 237 10*3/uL (ref 150–400)
RBC: 4.81 MIL/uL (ref 3.87–5.11)
RDW: 15.1 % (ref 11.5–15.5)
Smear Review: NORMAL
WBC: 5.6 10*3/uL (ref 4.0–10.5)
nRBC: 0 % (ref 0.0–0.2)

## 2021-10-30 LAB — C-REACTIVE PROTEIN: CRP: <0.5 mg/dL (ref <1.0)

## 2021-10-30 LAB — BASIC METABOLIC PANEL
Anion gap: 7 (ref 5–15)
BUN: 21 mg/dL (ref 8–23)
CO2: 25 mmol/L (ref 22–32)
Calcium: 8.9 mg/dL (ref 8.9–10.3)
Chloride: 108 mmol/L (ref 98–111)
Creatinine, Ser: 0.8 mg/dL (ref 0.44–1.00)
GFR, Estimated: >60 mL/min (ref >60)
Glucose, Bld: 112 mg/dL — ABNORMAL HIGH (ref 70–99)
Potassium: 3.9 mmol/L (ref 3.5–5.1)
Sodium: 140 mmol/L (ref 135–145)

## 2021-10-30 LAB — SEDIMENTATION RATE: Sed Rate: 30 mm/hr (ref 0–30)

## 2021-10-30 SURGERY — ARTHROSCOPY, KNEE
Anesthesia: General | Site: Knee | Laterality: Right

## 2021-10-30 MED ORDER — PHENYLEPHRINE HCL (PRESSORS) 10 MG/ML IV SOLN
INTRAVENOUS | Status: DC | PRN
  Administered 2021-10-30: 40 ug via INTRAVENOUS
  Administered 2021-10-30 (×2): 80 ug via INTRAVENOUS

## 2021-10-30 MED ORDER — CHLORHEXIDINE GLUCONATE CLOTH 2 % EX PADS: 6.0000 | MEDICATED_PAD | Freq: Once | CUTANEOUS | Status: DC

## 2021-10-30 MED ORDER — LACTATED RINGERS IR SOLN
Status: DC | PRN
  Administered 2021-10-30 (×3): 3000 mL

## 2021-10-30 MED ORDER — CHLORHEXIDINE GLUCONATE 0.12 % MT SOLN
OROMUCOSAL | Status: AC
  Administered 2021-10-30: 15 mL via OROMUCOSAL
  Filled 2021-10-30: qty 15

## 2021-10-30 MED ORDER — FENTANYL CITRATE (PF) 100 MCG/2ML IJ SOLN
INTRAMUSCULAR | Status: AC
  Filled 2021-10-30: qty 2

## 2021-10-30 MED ORDER — ONDANSETRON HCL 4 MG/2ML IJ SOLN
INTRAMUSCULAR | Status: AC
  Administered 2021-10-30: 4 mg via INTRAVENOUS
  Filled 2021-10-30: qty 2

## 2021-10-30 MED ORDER — CHLORHEXIDINE GLUCONATE 0.12 % MT SOLN: 15.0000 mL | Freq: Once | OROMUCOSAL | Status: AC

## 2021-10-30 MED ORDER — MIDAZOLAM HCL 2 MG/2ML IJ SOLN
INTRAMUSCULAR | Status: DC | PRN
  Administered 2021-10-30 (×2): 1 mg via INTRAVENOUS

## 2021-10-30 MED ORDER — EPHEDRINE 5 MG/ML INJ
INTRAVENOUS | Status: AC
  Filled 2021-10-30: qty 5

## 2021-10-30 MED ORDER — MIDAZOLAM HCL 2 MG/2ML IJ SOLN
INTRAMUSCULAR | Status: AC
  Filled 2021-10-30: qty 2

## 2021-10-30 MED ORDER — DEXAMETHASONE SODIUM PHOSPHATE 10 MG/ML IJ SOLN
INTRAMUSCULAR | Status: AC
  Filled 2021-10-30: qty 1

## 2021-10-30 MED ORDER — LIDOCAINE HCL (PF) 2 % IJ SOLN
INTRAMUSCULAR | Status: AC
  Filled 2021-10-30: qty 5

## 2021-10-30 MED ORDER — HYDROMORPHONE HCL 1 MG/ML IJ SOLN
INTRAMUSCULAR | Status: DC | PRN
  Administered 2021-10-30: .5 mg via INTRAVENOUS

## 2021-10-30 MED ORDER — ACETAMINOPHEN 500 MG PO TABS
ORAL_TABLET | ORAL | Status: AC
Start: 2021-10-30 — End: 2021-10-30
  Administered 2021-10-30: 1000 mg via ORAL
  Filled 2021-10-30: qty 2

## 2021-10-30 MED ORDER — LIDOCAINE HCL 1 % IJ SOLN
INTRAMUSCULAR | Status: DC | PRN
  Administered 2021-10-30: 5 mL

## 2021-10-30 MED ORDER — EPHEDRINE SULFATE (PRESSORS) 50 MG/ML IJ SOLN
INTRAMUSCULAR | Status: DC | PRN
  Administered 2021-10-30: 5 mg via INTRAVENOUS
  Administered 2021-10-30: 10 mg via INTRAVENOUS

## 2021-10-30 MED ORDER — DEXAMETHASONE SODIUM PHOSPHATE 10 MG/ML IJ SOLN
INTRAMUSCULAR | Status: DC | PRN
  Administered 2021-10-30: 5 mg via INTRAVENOUS

## 2021-10-30 MED ORDER — ONDANSETRON HCL 4 MG/2ML IJ SOLN
INTRAMUSCULAR | Status: DC | PRN
  Administered 2021-10-30: 4 mg via INTRAVENOUS

## 2021-10-30 MED ORDER — FENTANYL CITRATE (PF) 100 MCG/2ML IJ SOLN: 25.0000 ug | INTRAMUSCULAR | Status: DC | PRN

## 2021-10-30 MED ORDER — LIDOCAINE HCL (CARDIAC) PF 100 MG/5ML IV SOSY
PREFILLED_SYRINGE | INTRAVENOUS | Status: DC | PRN
  Administered 2021-10-30: 80 mg via INTRAVENOUS

## 2021-10-30 MED ORDER — PHENYLEPHRINE 40 MCG/ML (10ML) SYRINGE FOR IV PUSH (FOR BLOOD PRESSURE SUPPORT)
PREFILLED_SYRINGE | INTRAVENOUS | Status: AC
  Filled 2021-10-30: qty 10

## 2021-10-30 MED ORDER — LACTATED RINGERS IV SOLN: INTRAVENOUS | Status: DC

## 2021-10-30 MED ORDER — BUPIVACAINE-EPINEPHRINE 0.25% -1:200000 IJ SOLN
INTRAMUSCULAR | Status: DC | PRN
Start: 2021-10-30 — End: 2021-10-30
  Administered 2021-10-30: 30 mL

## 2021-10-30 MED ORDER — LIDOCAINE HCL (PF) 1 % IJ SOLN
INTRAMUSCULAR | Status: AC
  Filled 2021-10-30: qty 30

## 2021-10-30 MED ORDER — FENTANYL CITRATE (PF) 100 MCG/2ML IJ SOLN
INTRAMUSCULAR | Status: DC | PRN
  Administered 2021-10-30 (×2): 25 ug via INTRAVENOUS
  Administered 2021-10-30: 50 ug via INTRAVENOUS

## 2021-10-30 MED ORDER — BUPIVACAINE-EPINEPHRINE (PF) 0.25% -1:200000 IJ SOLN
INTRAMUSCULAR | Status: AC
  Filled 2021-10-30: qty 30

## 2021-10-30 MED ORDER — HYDROMORPHONE HCL 1 MG/ML IJ SOLN
INTRAMUSCULAR | Status: AC
  Filled 2021-10-30: qty 1

## 2021-10-30 MED ORDER — PROPOFOL 10 MG/ML IV BOLUS
INTRAVENOUS | Status: DC | PRN
  Administered 2021-10-30: 140 mg via INTRAVENOUS
  Administered 2021-10-30: 60 mg via INTRAVENOUS

## 2021-10-30 MED ORDER — ONDANSETRON HCL 4 MG/2ML IJ SOLN
INTRAMUSCULAR | Status: AC
  Filled 2021-10-30: qty 2

## 2021-10-30 MED ORDER — ONDANSETRON HCL 4 MG/2ML IJ SOLN: 4.0000 mg | Freq: Once | INTRAMUSCULAR | Status: AC | PRN

## 2021-10-30 MED ORDER — ORAL CARE MOUTH RINSE: 15.0000 mL | Freq: Once | OROMUCOSAL | Status: AC

## 2021-10-30 MED ORDER — CEFAZOLIN SODIUM-DEXTROSE 2-4 GM/100ML-% IV SOLN
INTRAVENOUS | Status: AC
  Filled 2021-10-30: qty 100

## 2021-10-30 MED ORDER — PROPOFOL 10 MG/ML IV BOLUS
INTRAVENOUS | Status: AC
  Filled 2021-10-30: qty 20

## 2021-10-30 MED ORDER — ACETAMINOPHEN 500 MG PO TABS: 1000.0000 mg | ORAL_TABLET | ORAL | Status: AC

## 2021-10-30 MED ORDER — CEFAZOLIN SODIUM-DEXTROSE 2-4 GM/100ML-% IV SOLN
2.0000 g | INTRAVENOUS | Status: AC
  Administered 2021-10-30: 2 g via INTRAVENOUS

## 2021-10-30 MED ORDER — OXYCODONE HCL 5 MG PO TABS: 5.0000 mg | ORAL_TABLET | Freq: Once | ORAL | Status: DC | PRN

## 2021-10-30 MED ORDER — OXYCODONE HCL 5 MG/5ML PO SOLN: 5.0000 mg | Freq: Once | ORAL | Status: DC | PRN

## 2021-10-30 SURGICAL SUPPLY — 49 items
ADAPTER IRRIG TUBE 2 SPIKE SOL (ADAPTER) ×4 IMPLANT
BLADE FULL RADIUS 3.5 (BLADE) IMPLANT
BLADE SHAVER 4.5X7 STR FR (MISCELLANEOUS) IMPLANT
BUR BR 5.5 WIDE MOUTH (BURR) IMPLANT
CNTNR SPEC 2.5X3XGRAD LEK (MISCELLANEOUS)
CONT SPEC 4OZ STER OR WHT (MISCELLANEOUS)
CONTAINER SPEC 2.5X3XGRAD LEK (MISCELLANEOUS) IMPLANT
COOLER POLAR GLACIER W/PUMP (MISCELLANEOUS) IMPLANT
CUFF TOURN SGL QUICK 24 (TOURNIQUET CUFF)
CUFF TOURN SGL QUICK 34 (TOURNIQUET CUFF)
CUFF TRNQT CYL 24X4X16.5-23 (TOURNIQUET CUFF) IMPLANT
CUFF TRNQT CYL 34X4.125X (TOURNIQUET CUFF) IMPLANT
DEVICE SUCT BLK HOLE OR FLOOR (MISCELLANEOUS) ×1 IMPLANT
DRAPE ARTHRO LIMB 89X125 STRL (DRAPES) ×2 IMPLANT
DRAPE IMP U-DRAPE 54X76 (DRAPES) ×2 IMPLANT
DURAPREP 26ML APPLICATOR (WOUND CARE) ×6 IMPLANT
GAUZE SPONGE 4X4 12PLY STRL (GAUZE/BANDAGES/DRESSINGS) ×2 IMPLANT
GAUZE XEROFORM 1X8 LF (GAUZE/BANDAGES/DRESSINGS) ×2 IMPLANT
GLOVE SURG ORTHO LTX SZ9 (GLOVE) ×4 IMPLANT
GLOVE SURG UNDER POLY LF SZ9 (GLOVE) ×2 IMPLANT
GOWN STRL REUS TWL 2XL XL LVL4 (GOWN DISPOSABLE) ×2 IMPLANT
GOWN STRL REUS W/ TWL LRG LVL3 (GOWN DISPOSABLE) ×1 IMPLANT
GOWN STRL REUS W/TWL LRG LVL3 (GOWN DISPOSABLE) ×1
IV LACTATED RINGER IRRG 3000ML (IV SOLUTION) ×4
IV LR IRRIG 3000ML ARTHROMATIC (IV SOLUTION) ×4 IMPLANT
KIT TURNOVER KIT A (KITS) ×2 IMPLANT
MANIFOLD NEPTUNE II (INSTRUMENTS) ×4 IMPLANT
MAT ABSORB  FLUID 56X50 GRAY (MISCELLANEOUS) ×1
MAT ABSORB FLUID 56X50 GRAY (MISCELLANEOUS) ×2 IMPLANT
NEEDLE HYPO 22GX1.5 SAFETY (NEEDLE) ×2 IMPLANT
PACK ARTHROSCOPY KNEE (MISCELLANEOUS) ×2 IMPLANT
PAD ABD DERMACEA PRESS 5X9 (GAUZE/BANDAGES/DRESSINGS) ×4 IMPLANT
PAD WRAPON POLAR KNEE (MISCELLANEOUS) ×1 IMPLANT
SHAVER BLADE BONE CUTTER  5.5 (BLADE)
SHAVER BLADE BONE CUTTER 4.5 (BLADE) ×1 IMPLANT
SHAVER BLADE BONE CUTTER 5.5 (BLADE) IMPLANT
SHAVER BLADE TAPERED BLUNT 4 (BLADE) ×2 IMPLANT
SOL PREP PVP 2OZ (MISCELLANEOUS)
SOLUTION PREP PVP 2OZ (MISCELLANEOUS) IMPLANT
SPONGE T-LAP 18X18 ~~LOC~~+RFID (SPONGE) ×2 IMPLANT
STRIP CLOSURE SKIN 1/2X4 (GAUZE/BANDAGES/DRESSINGS) ×2 IMPLANT
SUT ETHILON 4-0 (SUTURE) ×1
SUT ETHILON 4-0 FS2 18XMFL BLK (SUTURE) ×1
SUTURE ETHLN 4-0 FS2 18XMF BLK (SUTURE) ×1 IMPLANT
TUBING INFLOW SET DBFLO PUMP (TUBING) ×2 IMPLANT
TUBING OUTFLOW SET DBLFO PUMP (TUBING) ×2 IMPLANT
WAND WEREWOLF FLOW 90D (MISCELLANEOUS) ×2 IMPLANT
WATER STERILE IRR 500ML POUR (IV SOLUTION) ×2 IMPLANT
WRAPON POLAR PAD KNEE (MISCELLANEOUS)

## 2021-10-30 NOTE — Op Note (Signed)
?  PATIENT:  Lydia Elliott ? ?PRE-OPERATIVE DIAGNOSIS: Persistent drainage from the medial portal following right knee arthroscopy on 10/04/2021. ? ?POST-OPERATIVE DIAGNOSIS: Subcutaneous abscess of right medial portal ? ?PROCEDURE:  RIGHT KNEE INCISION AND DRAINAGE OF RIGHT MEDIAL PORTAL WITH DIAGNOSTIC ARTHROSCOPY WITH LAVAGE ? ?SURGEON:  Thornton Park, MD ? ?ANESTHESIA:   General ? ?PREOPERATIVE INDICATIONS:  Lydia Elliott  71 y.o. female with a diagnosis of persistent drainage from the medial portal of the right knee after undergoing a right knee arthroscopy on 10/04/2021.  Given the persistent drainage the decision to open the medial portal and perform a knee arthroscopy to evaluate for infection. ? ?The risks benefits and alternatives were discussed with the patient preoperatively including the risks of infection, bleeding, nerve injury, knee stiffness, persistent pain, osteoarthritis and the need for further surgery. Medical  risks include DVT and pulmonary embolism, myocardial infarction, stroke, pneumonia, respiratory failure and death. The patient understood these risks and wished to proceed. ? ?OPERATIVE FINDINGS: Patient had a subcutaneous abscess under the medial portal.  There is no evidence of an intra-articular infection. ? ?OPERATIVE PROCEDURE: Patient was met in the preoperative area. The right knee was signed with the word yes and my initials according the hospital's correct site of surgery protocol. ? ?The patient was brought to the operating room where she was placed supine on the operative table. General anesthesia was administered.  Patient had 3 nylon sutures removed from her medial portal incision.  The patient was prepped and draped in a sterile fashion. ? ?A timeout was performed to verify the patient's name, date of birth, medical record number, correct site of surgery correct procedure to be performed. It was also used to verify the patient received antibiotics that all appropriate  instruments, and radiographic studies were available in the room. Once all in attendance were in agreement, the case began. ? ?Serous fluid was expressed from the medial portal incision and cultured with a culture swab.  The medial incision was then bluntly opened with a hemostat and a small amount of purulent material was expressed.  This purulent material was collected and sent to the lab for Gram stain and culture. ? ?A full diagnostic examination of the knee was performed including the suprapatellar pouch, patellofemoral joint, medial lateral compartments as well as the medial lateral gutters, the intercondylar notch in the posterior knee. ? ?Findings on arthroscopy included advanced osteoarthritis in the patellofemoral and medial compartments as previously noted.  There was no evidence of intra-articular joint infection.  Patient underwent extensive lavage of the right knee with a Dyonics flyer shaver blade. ? ?All arthroscopic instruments were then removed. The two arthroscopy portals were closed with 4-0 nylon.  0.25% Marcaine with epi was injected intra-articularly to assist in postoperative analgesia.  Steri-Strips were applied along with a dry sterile and compressive dressing. The patient was brought to the PACU in stable condition. I was scrubbed and present for the entire case and all sharp, sponge and instrument counts were correct at the conclusion the case. I spoke with the patient's friend postoperatively by phone to let her know the case was performed without complication and the patient was stable in the recovery room. ? ? ?Timoteo Gaul, MD ? ? ?  ?

## 2021-10-30 NOTE — H&P (Signed)
PREOPERATIVE H&P ? ?Chief Complaint: Persistent serous drainage from previous medial arthroscopy portal ? ?HPI: ?Lydia Elliott is a 71 y.o. female who presents for preoperative history and physical with persistent serous drainage from her medial arthroscopy portal after undergoing a right knee arthroscopy on 10/04/2021.  The decision was made to proceed with arthroscopic washout of the right knee with cultures to rule out postop infection. ? ?Past Medical History:  ?Diagnosis Date  ? Anemia   ? Anxiety   ? a.) on BZO  ? Aortic atherosclerosis ([place])   ? Arthritis   ? hands, knees, toes  ? Bone island 10/15/2016  ? a.) RIGHT humeral head  ? CAD (coronary artery disease)   ? a.) 2010 --> PCI with stent placement x 1 (type location unknown)  ? Cataract   ? Depression   ? GERD (gastroesophageal reflux disease)   ? Hyperlipidemia   ? Hypertension   ? IBS (irritable bowel syndrome)   ? Insomnia   ? a.) on zolpidem  ? Nodule of right lung 10/18/2016  ? Psoriatic arthritis ([place])   ? Takotsubo cardiomyopathy   ? Thalassemia   ? Vertigo   ? none - 10 yrs  ? Wears dentures   ? partial lower  ? ?Past Surgical History:  ?Procedure Laterality Date  ? APPENDECTOMY  08/12/1965  ? CARPAL TUNNEL RELEASE  08/12/2005  ? CATARACT EXTRACTION  08/13/2011  ? CHOLECYSTECTOMY  08/12/1998  ? COLONOSCOPY  03/11/2014  ? Dr.Wohl  ? COLONOSCOPY WITH PROPOFOL N/A 12/11/2015  ? Procedure: COLONOSCOPY WITH PROPOFOL;  Surgeon: Lucilla Lame, MD;  Location: [place];  Service: Endoscopy;  Laterality: N/A;  DESCENDING COLON POLYP ?  ? CORONARY ANGIOPLASTY WITH STENT PLACEMENT  08/12/2008  ? ESOPHAGOGASTRODUODENOSCOPY  02/21/2014  ? Dr.Wohl  ? ESOPHAGOGASTRODUODENOSCOPY (EGD) WITH PROPOFOL N/A 12/11/2015  ? Procedure: ESOPHAGOGASTRODUODENOSCOPY (EGD) WITH PROPOFOL;  Surgeon: Lucilla Lame, MD;  Location: [place];  Service: Endoscopy;  Laterality: N/A;  ? KNEE ARTHROSCOPY Right 10/04/2021  ? Procedure: ARTHROSCOPY KNEE;  Surgeon:  Thornton Park, MD;  Location: [facility];  Service: Orthopedics;  Laterality: Right;  ? POLYPECTOMY N/A 12/11/2015  ? Procedure: POLYPECTOMY;  Surgeon: Lucilla Lame, MD;  Location: [place];  Service: Endoscopy;  Laterality: N/A;  ? TONSILLECTOMY    ? TONSILLECTOMY AND ADENOIDECTOMY  08/13/1967  ? ?Social History  ? ?Socioeconomic History  ? Marital status: Widowed  ?  Spouse name: Not on file  ? Number of children: Not on file  ? Years of education: Not on file  ? Highest education level: Not on file  ?Occupational History  ? Not on file  ?Tobacco Use  ? Smoking status: Former  ?  Packs/day: 0.50  ?  Years: 1.00  ?  Pack years: 0.50  ?  Types: Cigarettes  ?  Quit date: 25  ?  Years since quitting: 51.2  ? Smokeless tobacco: Never  ? Tobacco comments:  ?  smoked occasionally  ?Vaping Use  ? Vaping Use: Never used  ?Substance and Sexual Activity  ? Alcohol use: Yes  ?  Alcohol/week: 1.0 standard drink  ?  Types: 1 Shots of liquor per week  ?  Comment: socially  ? Drug use: No  ? Sexual activity: Yes  ?Other Topics Concern  ? Not on file  ?Social History Narrative  ? Not on file  ? ?Social Determinants of Health  ? ?Financial Resource Strain: Not on file  ?Food Insecurity: Not on file  ?Transportation  Needs: Not on file  ?Physical Activity: Not on file  ?Stress: Not on file  ?Social Connections: Not on file  ? ?Family History  ?Problem Relation Age of Onset  ? Cirrhosis Mother   ? Hepatitis Mother   ? Thalassemia Mother   ? Diabetes Mother   ? Heart disease Father   ? Diabetes Father   ? Hyperlipidemia Father   ? Hypertension Father   ? Stroke Father   ? Cancer Sister   ?     lung  ? Diabetes Sister   ? Stroke Sister   ?     during a surgery  ? Diabetes Brother   ? Heart disease Brother   ? Hypertension Brother   ? Stroke Brother   ? Thalassemia Maternal Grandmother   ? COPD Neg Hx   ? ?Allergies  ?Allergen Reactions  ? Rosuvastatin   ?  Muscle Pain  ? ?Prior to Admission medications   ?Medication Sig  Start Date End Date Taking? Authorizing Provider  ?ALPRAZolam (XANAX) 0.25 MG tablet Take 0.25 mg by mouth 2 (two) times daily as needed for anxiety.   Yes [provider]  ?aspirin EC 325 MG tablet Take 1 tablet (325 mg total) by mouth daily. 10/04/21  Yes Thornton Park, MD  ?atorvastatin (LIPITOR) 20 MG tablet Take 20 mg by mouth daily. 09/05/21  Yes [provider]  ?busPIRone (BUSPAR) 5 MG tablet Take 5 mg by mouth daily. 01/25/20  Yes [provider]  ?carvedilol (COREG) 3.125 MG tablet Take 3.125 mg by mouth 2 (two) times daily with a meal. 03/15/16  Yes [provider]  ?diclofenac Sodium (VOLTAREN) 1 % GEL Apply 1 application topically 2 (two) times daily as needed (pain).   Yes [provider]  ?docusate sodium (COLACE) 100 MG capsule Take 1 capsule (100 mg total) by mouth daily as needed. 10/04/21 10/04/22 Yes Thornton Park, MD  ?escitalopram (LEXAPRO) 20 MG tablet TAKE 1 TABLET BY MOUTH EVERY DAY 11/10/19  Yes Delsa Grana, PA-C  ?HYDROcodone-acetaminophen (NORCO) 5-325 MG tablet Take 1-2 tablets by mouth every 4 (four) hours as needed for moderate pain. 10/04/21  Yes Thornton Park, MD  ?losartan (COZAAR) 25 MG tablet Take 25 mg by mouth 2 (two) times a day.  08/03/18  Yes [provider]  ?omeprazole (PRILOSEC) 20 MG capsule Take 20 mg by mouth daily.   Yes [provider]  ?zolpidem (AMBIEN) 10 MG tablet Take 5 mg by mouth at bedtime as needed for sleep. 05/19/15  Yes [provider]  ? ? ? ?Positive ROS: All other systems have been reviewed and were otherwise negative with the exception of those mentioned in the HPI and as above. ? ?Physical Exam: ?General: Alert, no acute distress ?Cardiovascular: Regular rate and rhythm, no murmurs rubs or gallops.  No pedal edema ?Respiratory: Clear to auscultation bilaterally, no wheezes rales or rhonchi. No cyanosis, no use of accessory musculature ?GI: No organomegaly, abdomen is soft and  non-tender nondistended with positive bowel sounds. ?Skin: Skin intact, no lesions within the operative field. ?Neurologic: Sensation intact distally ?Psychiatric: Patient is competent for consent with normal mood and affect ?Lymphatic: No cervical lymphadenopathy ? ?MUSCULOSKELETAL: Right knee: Patient has no erythema or large effusion.  She has painless range of motion from within 5 degrees of full extension to approximately 110 degrees of flexion.  She has patellofemoral crepitus but no apprehension or instability.  She is tenderness over the medial greater than lateral joint line.  There is no ligamentous laxity.  She has no calf tenderness or lower leg edema.  Distally she is neurovascular intact.  Patient has petechiae over the medial knee and proximal right leg.  Patient also has a healing abrasion over the superior lateral knee. ? ?Assessment: ?Persistent drainage from medial right knee incision possible surgical site infection ? ?Plan: ?Plan for Procedure(s): ?Right diagnostic arthroscopy with irrigation and debridement ? ?I met the patient in the preoperative area this morning.  A preop history and physical was performed.  I marked the right knee according to hospital's correct site of surgery protocol.  I described the procedure in detail and answered the patient's questions. ? ?I discussed the risks and benefits of surgery. The risks include but are not limited to infection, bleeding, nerve or blood vessel injury, joint stiffness or loss of motion, persistent pain, weakness or instability, and the need for further surgery including repeat washout.  Patient understood these risks and wished to proceed.  ? ? ? ?Thornton Park, MD ? ? ?10/30/2021 ?11:21 AM ?  ?

## 2021-10-30 NOTE — Discharge Instructions (Signed)
AMBULATORY SURGERY  ?DISCHARGE INSTRUCTIONS ? ? ?The drugs that you were given will stay in your system until tomorrow so for the next 24 hours you should not: ? ?Drive an automobile ?Make any legal decisions ?Drink any alcoholic beverage ? ? ?You may resume regular meals tomorrow.  Today it is better to start with liquids and gradually work up to solid foods. ? ?You may eat anything you prefer, but it is better to start with liquids, then soup and crackers, and gradually work up to solid foods. ? ? ?Please notify your doctor immediately if you have any unusual bleeding, trouble breathing, redness and pain at the surgery site, drainage, fever, or pain not relieved by medication. ? ? ? ?Additional Instructions: ? ? ? ?Please contact your physician with any problems or Same Day Surgery at [phone], Monday through Friday 6 am to 4 pm, or [place] [facility] [place] Main number at [phone].  ?

## 2021-10-30 NOTE — Anesthesia Procedure Notes (Signed)
Procedure Name: LMA Insertion ?Date/Time: 10/30/2021 11:48 AM ?Performed by: Demetrius Charity, CRNA ?Pre-anesthesia Checklist: Patient identified, Patient being monitored, Timeout performed, Emergency Drugs available and Suction available ?Patient Re-evaluated:Patient Re-evaluated prior to induction ?Oxygen Delivery Method: Circle system utilized ?Preoxygenation: Pre-oxygenation with 100% oxygen ?Induction Type: IV induction ?Ventilation: Mask ventilation without difficulty ?LMA: LMA inserted ?LMA Size: 4.0 ?Tube type: Oral ?Number of attempts: 1 ?Placement Confirmation: positive ETCO2 and breath sounds checked- equal and bilateral ?Tube secured with: Tape ?Dental Injury: Teeth and Oropharynx as per pre-operative assessment  ? ? ? ? ?

## 2021-10-30 NOTE — Anesthesia Postprocedure Evaluation (Signed)
Anesthesia Post Note ? ?Patient: Lydia Elliott ? ?Procedure(s) Performed: ARTHROSCOPY KNEE, I&D OF SUPERFICIAL ABSCESS (Right: Knee) ? ?Patient location during evaluation: PACU ?Anesthesia Type: General ?Level of consciousness: awake and alert ?Pain management: pain level controlled ?Vital Signs Assessment: post-procedure vital signs reviewed and stable ?Respiratory status: spontaneous breathing, nonlabored ventilation, respiratory function stable and patient connected to nasal cannula oxygen ?Cardiovascular status: blood pressure returned to baseline and stable ?Postop Assessment: no apparent nausea or vomiting ?Anesthetic complications: no ? ? ?No notable events documented. ? ? ?Last Vitals:  ?Vitals:  ? 10/30/21 1343 10/30/21 1345  ?BP:  (!) 157/62  ?Pulse: 92 91  ?Resp: 18 12  ?Temp:    ?SpO2: 100% 100%  ?  ?Last Pain:  ?Vitals:  ? 10/30/21 1345  ?PainSc: 0-No pain  ? ? ?  ?  ?  ?  ?  ?  ? ?Arita Miss ? ? ? ? ?

## 2021-10-30 NOTE — Transfer of Care (Signed)
Immediate Anesthesia Transfer of Care Note ? ?Patient: Lydia Elliott ? ?Procedure(s) Performed: ARTHROSCOPY KNEE, I&D OF SUPERFICIAL ABSCESS (Right: Knee) ? ?Patient Location: PACU ? ?Anesthesia Type:General ? ?Level of Consciousness: drowsy ? ?Airway & Oxygen Therapy: Patient Spontanous Breathing and Patient connected to face mask oxygen ? ?Post-op Assessment: Report given to RN and Post -op Vital signs reviewed and stable ? ?Post vital signs: Reviewed and stable ? ?Last Vitals:  ?Vitals Value Taken Time  ?BP 146/65 10/30/21 1310  ?Temp 36.3 ?C 10/30/21 1310  ?Pulse 89 10/30/21 1313  ?Resp 13 10/30/21 1313  ?SpO2 94 % 10/30/21 1313  ?Vitals shown include unvalidated device data. ? ?Last Pain:  ?Vitals:  ? 10/30/21 1026  ?PainSc: 0-No pain  ?   ? ?  ? ?Complications: No notable events documented. ?

## 2021-10-30 NOTE — Anesthesia Preprocedure Evaluation (Signed)
Anesthesia Evaluation  ?Patient identified by MRN, date of birth, ID band ?Patient awake ? ? ? ?Reviewed: ?Allergy & Precautions, H&P , NPO status , Patient's Chart, lab work & pertinent test results, reviewed documented beta blocker date and time  ? ?History of Anesthesia Complications ?Negative for: history of anesthetic complications ? ?Airway ?Mallampati: III ? ?TM Distance: >3 FB ?Neck ROM: full ? ? ? Dental ? ?(+) Dental Advidsory Given, Teeth Intact, Caps ?  ?Pulmonary ?neg pulmonary ROS, former smoker,  ?  ?Pulmonary exam normal ?breath sounds clear to auscultation ? ? ? ? ? ? Cardiovascular ?Exercise Tolerance: Good ?hypertension, (-) angina+ CAD and + Cardiac Stents  ?(-) Past MI Normal cardiovascular exam(-) dysrhythmias (-) Valvular Problems/Murmurs ?Rhythm:regular Rate:Normal ? ? ?  ?Neuro/Psych ?PSYCHIATRIC DISORDERS Anxiety Depression negative neurological ROS ?   ? GI/Hepatic ?Neg liver ROS, GERD  ,  ?Endo/Other  ?negative endocrine ROS ? Renal/GU ?negative Renal ROS  ?negative genitourinary ?  ?Musculoskeletal ? ? Abdominal ?  ?Peds ? Hematology ?negative hematology ROS ?(+)   ?Anesthesia Other Findings ?Past Medical History: ?No date: Anemia ?No date: Anxiety ?    Comment:  a.) on BZO ?No date: Aortic atherosclerosis ([place]) ?No date: Arthritis ?    Comment:  hands, knees, toes ?10/15/2016: Bone island ?    Comment:  a.) RIGHT humeral head ?No date: CAD (coronary artery disease) ?    Comment:  a.) 2010 --> PCI with stent placement x 1 (type location ?             unknown) ?No date: Cataract ?No date: Depression ?No date: GERD (gastroesophageal reflux disease) ?No date: Hyperlipidemia ?No date: Hypertension ?No date: IBS (irritable bowel syndrome) ?No date: Insomnia ?    Comment:  a.) on zolpidem ?10/18/2016: Nodule of right lung ?No date: Psoriatic arthritis ([place]) ?No date: Takotsubo cardiomyopathy ?No date: Thalassemia ?No date: Vertigo ?    Comment:  none - 10  yrs ?No date: Wears dentures ?    Comment:  partial lower ? ? Reproductive/Obstetrics ?negative OB ROS ? ?  ? ? ? ? ? ? ? ? ? ? ? ? ? ?  ?  ? ? ? ? ? ? ? ? ?Anesthesia Physical ? ?Anesthesia Plan ? ?ASA: 3 ? ?Anesthesia Plan: General  ? ?Post-op Pain Management: Ofirmev IV (intra-op)*  ? ?Induction: Intravenous ? ?PONV Risk Score and Plan: 3 and Ondansetron, Dexamethasone and Treatment may vary due to age or medical condition ? ?Airway Management Planned: LMA ? ?Additional Equipment: None ? ?Intra-op Plan:  ? ?Post-operative Plan: Extubation in OR ? ?Informed Consent: I have reviewed the patients History and Physical, chart, labs and discussed the procedure including the risks, benefits and alternatives for the proposed anesthesia with the patient or authorized representative who has indicated his/her understanding and acceptance.  ? ? ? ?Dental Advisory Given ? ?Plan Discussed with: Anesthesiologist, CRNA and Surgeon ? ?Anesthesia Plan Comments: (Discussed risks of anesthesia with patient, including PONV, sore throat, lip/dental/eye damage. Rare risks discussed as well, such as cardiorespiratory and neurological sequelae, and allergic reactions. Discussed the role of CRNA in patient's perioperative care. Patient understands.)  ? ? ? ? ? ? ?Anesthesia Quick Evaluation ? ?

## 2021-10-31 ENCOUNTER — Encounter: Payer: Self-pay | Admitting: Orthopedic Surgery

## 2021-11-04 LAB — AEROBIC/ANAEROBIC CULTURE W GRAM STAIN (SURGICAL/DEEP WOUND)

## 2021-11-07 DIAGNOSIS — M25561 Pain in right knee: Secondary | ICD-10-CM | POA: Diagnosis not present

## 2021-11-07 DIAGNOSIS — M25661 Stiffness of right knee, not elsewhere classified: Secondary | ICD-10-CM | POA: Diagnosis not present

## 2021-12-05 DIAGNOSIS — S80211A Abrasion, right knee, initial encounter: Secondary | ICD-10-CM | POA: Diagnosis not present

## 2021-12-07 DIAGNOSIS — E782 Mixed hyperlipidemia: Secondary | ICD-10-CM | POA: Diagnosis not present

## 2021-12-07 DIAGNOSIS — R5383 Other fatigue: Secondary | ICD-10-CM | POA: Diagnosis not present

## 2021-12-07 DIAGNOSIS — F411 Generalized anxiety disorder: Secondary | ICD-10-CM | POA: Diagnosis not present

## 2021-12-07 DIAGNOSIS — E538 Deficiency of other specified B group vitamins: Secondary | ICD-10-CM | POA: Diagnosis not present

## 2021-12-07 DIAGNOSIS — I2510 Atherosclerotic heart disease of native coronary artery without angina pectoris: Secondary | ICD-10-CM | POA: Diagnosis not present

## 2021-12-07 DIAGNOSIS — L4050 Arthropathic psoriasis, unspecified: Secondary | ICD-10-CM | POA: Diagnosis not present

## 2021-12-07 DIAGNOSIS — I10 Essential (primary) hypertension: Secondary | ICD-10-CM | POA: Diagnosis not present

## 2021-12-12 DIAGNOSIS — E538 Deficiency of other specified B group vitamins: Secondary | ICD-10-CM | POA: Diagnosis not present

## 2021-12-13 DIAGNOSIS — M25661 Stiffness of right knee, not elsewhere classified: Secondary | ICD-10-CM | POA: Diagnosis not present

## 2021-12-13 DIAGNOSIS — M25561 Pain in right knee: Secondary | ICD-10-CM | POA: Diagnosis not present

## 2021-12-16 ENCOUNTER — Inpatient Hospital Stay
Admission: EM | Admit: 2021-12-16 | Discharge: 2021-12-20 | DRG: 486 | Disposition: A | Discharge status: Home / Self Care | Payer: PPO | Attending: Internal Medicine | Admitting: Internal Medicine

## 2021-12-16 ENCOUNTER — Other Ambulatory Visit: Payer: Self-pay

## 2021-12-16 ENCOUNTER — Emergency Department: Payer: PPO

## 2021-12-16 DIAGNOSIS — E785 Hyperlipidemia, unspecified: Secondary | ICD-10-CM | POA: Diagnosis present

## 2021-12-16 DIAGNOSIS — M009 Pyogenic arthritis, unspecified: Secondary | ICD-10-CM

## 2021-12-16 DIAGNOSIS — Z7982 Long term (current) use of aspirin: Secondary | ICD-10-CM | POA: Diagnosis not present

## 2021-12-16 DIAGNOSIS — Z7952 Long term (current) use of systemic steroids: Secondary | ICD-10-CM | POA: Diagnosis not present

## 2021-12-16 DIAGNOSIS — Z833 Family history of diabetes mellitus: Secondary | ICD-10-CM | POA: Diagnosis not present

## 2021-12-16 DIAGNOSIS — I10 Essential (primary) hypertension: Secondary | ICD-10-CM | POA: Diagnosis present

## 2021-12-16 DIAGNOSIS — S81001A Unspecified open wound, right knee, initial encounter: Secondary | ICD-10-CM | POA: Diagnosis not present

## 2021-12-16 DIAGNOSIS — R0902 Hypoxemia: Secondary | ICD-10-CM | POA: Diagnosis not present

## 2021-12-16 DIAGNOSIS — M00861 Arthritis due to other bacteria, right knee: Secondary | ICD-10-CM | POA: Diagnosis not present

## 2021-12-16 DIAGNOSIS — Z20822 Contact with and (suspected) exposure to covid-19: Secondary | ICD-10-CM | POA: Diagnosis present

## 2021-12-16 DIAGNOSIS — K219 Gastro-esophageal reflux disease without esophagitis: Secondary | ICD-10-CM | POA: Diagnosis present

## 2021-12-16 DIAGNOSIS — Z83438 Family history of other disorder of lipoprotein metabolism and other lipidemia: Secondary | ICD-10-CM

## 2021-12-16 DIAGNOSIS — R52 Pain, unspecified: Secondary | ICD-10-CM | POA: Diagnosis not present

## 2021-12-16 DIAGNOSIS — F419 Anxiety disorder, unspecified: Secondary | ICD-10-CM | POA: Diagnosis present

## 2021-12-16 DIAGNOSIS — Z823 Family history of stroke: Secondary | ICD-10-CM | POA: Diagnosis not present

## 2021-12-16 DIAGNOSIS — D509 Iron deficiency anemia, unspecified: Secondary | ICD-10-CM | POA: Diagnosis present

## 2021-12-16 DIAGNOSIS — R609 Edema, unspecified: Secondary | ICD-10-CM | POA: Diagnosis not present

## 2021-12-16 DIAGNOSIS — I700 Atherosclerosis of aorta: Secondary | ICD-10-CM | POA: Diagnosis not present

## 2021-12-16 DIAGNOSIS — G4700 Insomnia, unspecified: Secondary | ICD-10-CM | POA: Diagnosis present

## 2021-12-16 DIAGNOSIS — Z79899 Other long term (current) drug therapy: Secondary | ICD-10-CM

## 2021-12-16 DIAGNOSIS — M00869 Arthritis due to other bacteria, unspecified knee: Secondary | ICD-10-CM | POA: Diagnosis not present

## 2021-12-16 DIAGNOSIS — I2510 Atherosclerotic heart disease of native coronary artery without angina pectoris: Secondary | ICD-10-CM | POA: Diagnosis present

## 2021-12-16 DIAGNOSIS — M7989 Other specified soft tissue disorders: Secondary | ICD-10-CM | POA: Diagnosis not present

## 2021-12-16 DIAGNOSIS — M25561 Pain in right knee: Secondary | ICD-10-CM | POA: Diagnosis not present

## 2021-12-16 DIAGNOSIS — Z87891 Personal history of nicotine dependence: Secondary | ICD-10-CM

## 2021-12-16 DIAGNOSIS — F418 Other specified anxiety disorders: Secondary | ICD-10-CM | POA: Diagnosis present

## 2021-12-16 DIAGNOSIS — M00061 Staphylococcal arthritis, right knee: Secondary | ICD-10-CM | POA: Diagnosis not present

## 2021-12-16 DIAGNOSIS — Z8249 Family history of ischemic heart disease and other diseases of the circulatory system: Secondary | ICD-10-CM

## 2021-12-16 DIAGNOSIS — F32A Depression, unspecified: Secondary | ICD-10-CM | POA: Diagnosis present

## 2021-12-16 DIAGNOSIS — Z888 Allergy status to other drugs, medicaments and biological substances status: Secondary | ICD-10-CM | POA: Diagnosis not present

## 2021-12-16 DIAGNOSIS — E663 Overweight: Secondary | ICD-10-CM | POA: Diagnosis not present

## 2021-12-16 DIAGNOSIS — K589 Irritable bowel syndrome without diarrhea: Secondary | ICD-10-CM | POA: Diagnosis not present

## 2021-12-16 DIAGNOSIS — Z9849 Cataract extraction status, unspecified eye: Secondary | ICD-10-CM

## 2021-12-16 DIAGNOSIS — D849 Immunodeficiency, unspecified: Secondary | ICD-10-CM | POA: Diagnosis not present

## 2021-12-16 DIAGNOSIS — D569 Thalassemia, unspecified: Secondary | ICD-10-CM | POA: Diagnosis present

## 2021-12-16 HISTORY — DX: Pyogenic arthritis, unspecified: M00.9

## 2021-12-16 LAB — CBC WITH DIFFERENTIAL/PLATELET
Abs Immature Granulocytes: 0.1 K/uL — ABNORMAL HIGH (ref 0.00–0.07)
Basophils Absolute: 0.1 K/uL (ref 0.0–0.1)
Basophils Relative: 1 %
Eosinophils Absolute: 0.2 K/uL (ref 0.0–0.5)
Eosinophils Relative: 1 %
HCT: 36.1 % (ref 36.0–46.0)
Hemoglobin: 10.6 g/dL — ABNORMAL LOW (ref 12.0–15.0)
Immature Granulocytes: 1 %
Lymphocytes Relative: 12 %
Lymphs Abs: 1.9 K/uL (ref 0.7–4.0)
MCH: 20.1 pg — ABNORMAL LOW (ref 26.0–34.0)
MCHC: 29.4 g/dL — ABNORMAL LOW (ref 30.0–36.0)
MCV: 68.5 fL — ABNORMAL LOW (ref 80.0–100.0)
Monocytes Absolute: 1.1 K/uL — ABNORMAL HIGH (ref 0.1–1.0)
Monocytes Relative: 6 %
Neutro Abs: 13.1 K/uL — ABNORMAL HIGH (ref 1.7–7.7)
Neutrophils Relative %: 79 %
Platelets: 293 K/uL (ref 150–400)
RBC: 5.27 MIL/uL — ABNORMAL HIGH (ref 3.87–5.11)
RDW: 15.7 % — ABNORMAL HIGH (ref 11.5–15.5)
Smear Review: NORMAL
WBC: 16.4 K/uL — ABNORMAL HIGH (ref 4.0–10.5)
nRBC: 0 % (ref 0.0–0.2)

## 2021-12-16 LAB — COMPREHENSIVE METABOLIC PANEL WITH GFR
ALT: 14 U/L (ref 0–44)
AST: 17 U/L (ref 15–41)
Albumin: 3.8 g/dL (ref 3.5–5.0)
Alkaline Phosphatase: 75 U/L (ref 38–126)
Anion gap: 10 (ref 5–15)
BUN: 17 mg/dL (ref 8–23)
CO2: 24 mmol/L (ref 22–32)
Calcium: 8.9 mg/dL (ref 8.9–10.3)
Chloride: 105 mmol/L (ref 98–111)
Creatinine, Ser: 0.72 mg/dL (ref 0.44–1.00)
GFR, Estimated: >60 mL/min
Glucose, Bld: 113 mg/dL — ABNORMAL HIGH (ref 70–99)
Potassium: 3.7 mmol/L (ref 3.5–5.1)
Sodium: 139 mmol/L (ref 135–145)
Total Bilirubin: 1.4 mg/dL — ABNORMAL HIGH (ref 0.3–1.2)
Total Protein: 6.9 g/dL (ref 6.5–8.1)

## 2021-12-16 LAB — APTT: aPTT: 30 seconds (ref 24–36)

## 2021-12-16 LAB — RESP PANEL BY RT-PCR (FLU A&B, COVID) ARPGX2
Influenza A by PCR: NEGATIVE
Influenza B by PCR: NEGATIVE
SARS Coronavirus 2 by RT PCR: NEGATIVE

## 2021-12-16 LAB — PROCALCITONIN: Procalcitonin: <0.1 ng/mL

## 2021-12-16 LAB — MRSA NEXT GEN BY PCR, NASAL: MRSA by PCR Next Gen: NOT DETECTED

## 2021-12-16 LAB — SYNOVIAL CELL COUNT + DIFF, W/ CRYSTALS
Crystals, Fluid: NONE SEEN
Lymphocytes-Synovial Fld: 21 %
Monocyte-Macrophage-Synovial Fluid: 0 %
Neutrophil, Synovial: 79 %
WBC, Synovial: 57058 /mm3 — ABNORMAL HIGH (ref 0–200)

## 2021-12-16 LAB — PROTIME-INR
INR: 1.2 (ref 0.8–1.2)
Prothrombin Time: 15 seconds (ref 11.4–15.2)

## 2021-12-16 LAB — LACTIC ACID, PLASMA: Lactic Acid, Venous: 1.4 mmol/L (ref 0.5–1.9)

## 2021-12-16 LAB — C-REACTIVE PROTEIN: CRP: 0.5 mg/dL

## 2021-12-16 LAB — SEDIMENTATION RATE: Sed Rate: 9 mm/h (ref 0–30)

## 2021-12-16 MED ORDER — HYDROCODONE-ACETAMINOPHEN 5-325 MG PO TABS
1.0000 | ORAL_TABLET | ORAL | Status: DC | PRN
Start: 2021-12-16 — End: 2021-12-17
  Administered 2021-12-16 – 2021-12-17 (×4): 1 via ORAL
  Filled 2021-12-16 (×4): qty 1

## 2021-12-16 MED ORDER — SODIUM CHLORIDE 0.9 % IV SOLN
INTRAVENOUS | Status: DC
Start: 2021-12-16 — End: 2021-12-18

## 2021-12-16 MED ORDER — KETOROLAC TROMETHAMINE 30 MG/ML IJ SOLN
15.0000 mg | Freq: Once | INTRAMUSCULAR | Status: AC
  Administered 2021-12-16: 15 mg via INTRAVENOUS
  Filled 2021-12-16: qty 1

## 2021-12-16 MED ORDER — MORPHINE SULFATE (PF) 4 MG/ML IV SOLN
4.0000 mg | Freq: Once | INTRAVENOUS | Status: AC
  Administered 2021-12-16: 4 mg via INTRAVENOUS
  Filled 2021-12-16: qty 1

## 2021-12-16 MED ORDER — SODIUM CHLORIDE 0.9 % IV SOLN
2.0000 g | Freq: Once | INTRAVENOUS | Status: AC
  Administered 2021-12-16: 2 g via INTRAVENOUS
  Filled 2021-12-16: qty 12.5

## 2021-12-16 MED ORDER — LIDOCAINE-EPINEPHRINE 2 %-1:100000 IJ SOLN
20.0000 mL | Freq: Once | INTRAMUSCULAR | Status: AC
  Administered 2021-12-16: 20 mL via INTRADERMAL
  Filled 2021-12-16: qty 1

## 2021-12-16 MED ORDER — VANCOMYCIN HCL 10 G IV SOLR
1500.0000 mg | INTRAVENOUS | Status: DC
  Filled 2021-12-16: qty 15

## 2021-12-16 MED ORDER — ASPIRIN EC 325 MG PO TBEC
325.0000 mg | DELAYED_RELEASE_TABLET | Freq: Every day | ORAL | Status: DC
  Administered 2021-12-16 – 2021-12-20 (×5): 325 mg via ORAL
  Filled 2021-12-16 (×5): qty 1

## 2021-12-16 MED ORDER — PANTOPRAZOLE SODIUM 40 MG PO TBEC
40.0000 mg | DELAYED_RELEASE_TABLET | Freq: Every day | ORAL | Status: DC
  Administered 2021-12-16 – 2021-12-20 (×5): 40 mg via ORAL
  Filled 2021-12-16 (×5): qty 1

## 2021-12-16 MED ORDER — ESCITALOPRAM OXALATE 10 MG PO TABS
20.0000 mg | ORAL_TABLET | Freq: Every day | ORAL | Status: DC
  Administered 2021-12-16 – 2021-12-20 (×5): 20 mg via ORAL
  Filled 2021-12-16 (×5): qty 2

## 2021-12-16 MED ORDER — ONDANSETRON HCL 4 MG/2ML IJ SOLN
4.0000 mg | Freq: Three times a day (TID) | INTRAMUSCULAR | Status: DC | PRN
Start: 2021-12-16 — End: 2021-12-20

## 2021-12-16 MED ORDER — ZOLPIDEM TARTRATE 5 MG PO TABS
5.0000 mg | ORAL_TABLET | Freq: Every evening | ORAL | Status: DC | PRN
  Administered 2021-12-16 – 2021-12-19 (×3): 5 mg via ORAL
  Filled 2021-12-16 (×3): qty 1

## 2021-12-16 MED ORDER — CARVEDILOL 3.125 MG PO TABS
3.1250 mg | ORAL_TABLET | Freq: Two times a day (BID) | ORAL | Status: DC
  Administered 2021-12-16 – 2021-12-20 (×7): 3.125 mg via ORAL
  Filled 2021-12-16 (×7): qty 1

## 2021-12-16 MED ORDER — MORPHINE SULFATE (PF) 2 MG/ML IV SOLN
1.0000 mg | INTRAVENOUS | Status: DC | PRN
  Administered 2021-12-16 – 2021-12-17 (×2): 1 mg via INTRAVENOUS
  Filled 2021-12-16 (×2): qty 1

## 2021-12-16 MED ORDER — MORPHINE SULFATE (PF) 2 MG/ML IV SOLN
2.0000 mg | Freq: Once | INTRAVENOUS | Status: AC
  Administered 2021-12-16: 2 mg via INTRAVENOUS
  Filled 2021-12-16: qty 1

## 2021-12-16 MED ORDER — BUSPIRONE HCL 10 MG PO TABS
5.0000 mg | ORAL_TABLET | Freq: Every day | ORAL | Status: DC
  Administered 2021-12-16 – 2021-12-20 (×5): 5 mg via ORAL
  Filled 2021-12-16 (×5): qty 1

## 2021-12-16 MED ORDER — ACETAMINOPHEN 325 MG PO TABS
650.0000 mg | ORAL_TABLET | Freq: Four times a day (QID) | ORAL | Status: DC | PRN
Start: 2021-12-16 — End: 2021-12-20
  Administered 2021-12-16 – 2021-12-20 (×3): 650 mg via ORAL
  Filled 2021-12-16 (×3): qty 2

## 2021-12-16 MED ORDER — LOSARTAN POTASSIUM 25 MG PO TABS
25.0000 mg | ORAL_TABLET | Freq: Every day | ORAL | Status: DC
  Administered 2021-12-16 – 2021-12-19 (×4): 25 mg via ORAL
  Filled 2021-12-16 (×4): qty 1

## 2021-12-16 MED ORDER — VANCOMYCIN HCL 1500 MG/300ML IV SOLN
1500.0000 mg | Freq: Once | INTRAVENOUS | Status: DC
  Filled 2021-12-16: qty 300

## 2021-12-16 MED ORDER — SODIUM CHLORIDE 0.9 % IV SOLN
2.0000 g | Freq: Two times a day (BID) | INTRAVENOUS | Status: AC
  Administered 2021-12-17 – 2021-12-19 (×7): 2 g via INTRAVENOUS
  Filled 2021-12-16 (×8): qty 12.5

## 2021-12-16 MED ORDER — VANCOMYCIN HCL 500 MG/100ML IV SOLN
500.0000 mg | Freq: Once | INTRAVENOUS | Status: AC
  Administered 2021-12-16: 500 mg via INTRAVENOUS
  Filled 2021-12-16: qty 100

## 2021-12-16 MED ORDER — SODIUM CHLORIDE 0.9 % IV BOLUS
1000.0000 mL | Freq: Once | INTRAVENOUS | Status: AC
  Administered 2021-12-16: 1000 mL via INTRAVENOUS

## 2021-12-16 MED ORDER — HYDRALAZINE HCL 20 MG/ML IJ SOLN: 5.0000 mg | INTRAMUSCULAR | Status: DC | PRN

## 2021-12-16 MED ORDER — ALPRAZOLAM 0.25 MG PO TABS
0.2500 mg | ORAL_TABLET | Freq: Two times a day (BID) | ORAL | Status: DC | PRN
  Administered 2021-12-19: 0.25 mg via ORAL
  Filled 2021-12-16: qty 1

## 2021-12-16 MED ORDER — ATORVASTATIN CALCIUM 20 MG PO TABS
20.0000 mg | ORAL_TABLET | Freq: Every day | ORAL | Status: DC
  Administered 2021-12-16 – 2021-12-19 (×4): 20 mg via ORAL
  Filled 2021-12-16 (×4): qty 1

## 2021-12-16 MED ORDER — DOCUSATE SODIUM 100 MG PO CAPS: 100.0000 mg | ORAL_CAPSULE | Freq: Every day | ORAL | Status: DC | PRN

## 2021-12-16 MED ORDER — VANCOMYCIN HCL IN DEXTROSE 1-5 GM/200ML-% IV SOLN
1000.0000 mg | Freq: Once | INTRAVENOUS | Status: AC
  Administered 2021-12-16: 1000 mg via INTRAVENOUS
  Filled 2021-12-16: qty 200

## 2021-12-16 NOTE — Assessment & Plan Note (Signed)
Patient has a leukocytosis with WBC 16.4, but had no fever.  Does not meet criteria for sepsis.  Consulted Dr. Sharlet Salina, Dr. Mack Guise will see patient tomorrow ?-Admitted to MedSurg bed as inpatient ?-Antibiotics: Vancomycin and cefepime ?-Follow-up of blood culture ?-Synovial fluid culture ?-Pain control: As needed morphine, Norco and Tylenol ?

## 2021-12-16 NOTE — Progress Notes (Signed)
PHARMACY -  BRIEF ANTIBIOTIC NOTE  ? ?Pharmacy has received consult(s) for vancomycin and cefepime from an ED provider.  The patient's profile has been reviewed for ht/wt/allergies/indication/available labs.   ? ?One time order(s) placed for vancomycin 1500 mg and cefepime 2 grams x 1. ? ?Further antibiotics/pharmacy consults should be ordered by admitting physician if indicated.       ?                ?Thank you, ?Wynelle Cleveland, PharmD ?Pharmacy Resident  ?12/16/2021 ?12:38 PM ? ? ?

## 2021-12-16 NOTE — Assessment & Plan Note (Signed)
-   IV hydralazine as needed ?-Coreg, Cozaar ?

## 2021-12-16 NOTE — ED Triage Notes (Addendum)
Pt to ED via [facility] from home. Pt reports increased right knee and swelling that started this morning. Pt had knee surgery end of March due to infection and needing debridement. Pt reports doing at home exercises last night but denies hearing/feeling a pop or pain afterwards. Pt denies CP, SOB, fever, N/V/D. Pt denies any recent falls.  ? ?Pt given '4mg'$  zofran and 74mg fentanyl in route. ?

## 2021-12-16 NOTE — ED Provider Notes (Signed)
? ?[facility] ?Provider Note ? ? ? Event Date/Time  ? First MD Initiated Contact with Patient 12/16/21 [phone]   ?  (approximate) ? ? ?History  ? ?Post-op Problem (Right Knee) ? ? ?HPI ? ?Lydia Elliott is a 71 y.o. female here with right knee pain.  The patient states that over the last 12 hours, she has developed acute, severe, worsening right knee pain.  She has a fairly complex history of recent arthroscopy of her knee complicated by sepsis infection on oral antibiotics.  Her last procedure was in March, where she had an arthroscopy that showed no joint infection or inflammation, and she has been off antibiotics since then.  She states that she was more busy than usual throughout the day yesterday but denies any direct trauma.  When she awoke this morning, she had severe, exquisite pain of her right knee with essentially inability to move it or put any weight on it.  She has a history of psoriatic arthritis and has been on prednisone and Humira but has been holding this reportedly in the setting of her previous infection site at the right port site from her arthrocentesis.  She does not have any hardware in her knee.  She has had some chills today.  No known fevers. ?  ? ? ?Physical Exam  ? ?Triage Vital Signs: ?ED Triage Vitals [12/16/21 0858]  ?Enc Vitals Group  ?   BP (!) 161/78  ?   Pulse Rate 86  ?   Resp 20  ?   Temp 98.7 ?F (37.1 ?C)  ?   Temp Source Oral  ?   SpO2 99 %  ?   Weight   ?   Height   ?   Head Circumference   ?   Peak Flow   ?   Pain Score   ?   Pain Loc   ?   Pain Edu?   ?   Excl. in [place]?   ? ? ?Most recent vital signs: ?Vitals:  ? 12/16/21 1441 12/16/21 2008  ?BP: (!) 140/57 (!) 132/51  ?Pulse: 88 84  ?Resp: 18 20  ?Temp: 98.7 ?F (37.1 ?C) 99.2 ?F (37.3 ?C)  ?SpO2: 97% 92%  ? ? ? ?General: Awake, appears uncomfortable. ?CV:  Good peripheral perfusion.  Regular rate and rhythm. ?Resp:  Normal effort.  ?Abd:  No distention.  ?Other:  Moderate knee effusion worse along the  superomedial aspect of the right knee.  Exquisite pain with any passive or active range of motion of the knee.  Moderate warmth to touch.  No overlying skin redness.  There is what appears to be a healing wound along the right aspect of the knee, without significant induration or fluctuance.  There is a slightly fibrinous wound bed that does not appear infected grossly. ? ? ?ED Results / Procedures / Treatments  ? ?Labs ?(all labs ordered are listed, but only abnormal results are displayed) ?Labs Reviewed  ?CBC WITH DIFFERENTIAL/PLATELET - Abnormal; Notable for the following components:  ?    Result Value  ? WBC 16.4 (*)   ? RBC 5.27 (*)   ? Hemoglobin 10.6 (*)   ? MCV 68.5 (*)   ? MCH 20.1 (*)   ? MCHC 29.4 (*)   ? RDW 15.7 (*)   ? Neutro Abs 13.1 (*)   ? Monocytes Absolute 1.1 (*)   ? Abs Immature Granulocytes 0.10 (*)   ? All other components within normal limits  ?  COMPREHENSIVE METABOLIC PANEL - Abnormal; Notable for the following components:  ? Glucose, Bld 113 (*)   ? Total Bilirubin 1.4 (*)   ? All other components within normal limits  ?SYNOVIAL CELL COUNT + DIFF, W/ CRYSTALS - Abnormal; Notable for the following components:  ? Appearance-Synovial CLOUDY (*)   ? WBC, Synovial 57,058 (*)   ? All other components within normal limits  ?BODY FLUID CULTURE W GRAM STAIN  ?RESP PANEL BY RT-PCR (FLU A&B, COVID) ARPGX2  ?MRSA NEXT GEN BY PCR, NASAL  ?CULTURE, BLOOD (SINGLE)  ?CULTURE, BLOOD (SINGLE)  ?SEDIMENTATION RATE  ?LACTIC ACID, PLASMA  ?PROCALCITONIN  ?PROTIME-INR  ?APTT  ?C-REACTIVE PROTEIN  ?GLUCOSE, BODY FLUID OTHER            ?PROTEIN, BODY FLUID (OTHER)  ?CBC  ? ? ? ?EKG ?None ? ? ?RADIOLOGY ?X-ray knee: Patellofemoral compartment degeneration with superimposed joint effusion ? ? ?I also independently reviewed and agree with radiologist interpretations. ? ? ?PROCEDURES: ? ?Critical Care performed: No ? ?.Joint Aspiration/Arthrocentesis ? ?Date/Time: 12/16/2021 9:37 PM ?Performed by: Duffy Bruce,  MD ?Authorized by: Duffy Bruce, MD  ? ?Consent:  ?  Consent obtained:  Verbal ?  Consent given by:  Patient ?  Risks, benefits, and alternatives were discussed: yes   ?  Risks discussed:  Infection, nerve damage, incomplete drainage, bleeding, pain and poor cosmetic result ?  Alternatives discussed:  Referral ?Anesthesia:  ?  Anesthesia method:  Local infiltration ?  Local anesthetic:  Lidocaine 2% WITH epi ?Procedure details:  ?  Preparation: Patient was prepped and draped in usual sterile fashion   ?  Needle gauge:  18 G ?  Approach:  Medial ?  Aspirate amount:  30 ?  Aspirate characteristics:  Cloudy ?  Steroid injected: no   ?  Specimen collected: yes   ?Post-procedure details:  ?  Dressing:  Adhesive bandage ?  Procedure completion:  Tolerated well, no immediate complications ? ? ? ?MEDICATIONS ORDERED IN ED: ?Medications  ?HYDROcodone-acetaminophen (NORCO/VICODIN) 5-325 MG per tablet 1 tablet (1 tablet Oral Given 12/16/21 1635)  ?morphine (PF) 2 MG/ML injection 1 mg (1 mg Intravenous Given 12/16/21 2016)  ?ondansetron (ZOFRAN) injection 4 mg (has no administration in time range)  ?acetaminophen (TYLENOL) tablet 650 mg (650 mg Oral Given 12/16/21 2140)  ?hydrALAZINE (APRESOLINE) injection 5 mg (has no administration in time range)  ?0.9 %  sodium chloride infusion ( Intravenous New Bag/Given 12/16/21 1446)  ?ceFEPIme (MAXIPIME) 2 g in sodium chloride 0.9 % 100 mL IVPB (has no administration in time range)  ?vancomycin (VANCOCIN) 1,500 mg in sodium chloride 0.9 % 500 mL IVPB (has no administration in time range)  ?aspirin EC tablet 325 mg (325 mg Oral Given 12/16/21 1740)  ?atorvastatin (LIPITOR) tablet 20 mg (20 mg Oral Given 12/16/21 2140)  ?carvedilol (COREG) tablet 3.125 mg (3.125 mg Oral Given 12/16/21 1741)  ?losartan (COZAAR) tablet 25 mg (25 mg Oral Given 12/16/21 2140)  ?ALPRAZolam (XANAX) tablet 0.25 mg (has no administration in time range)  ?busPIRone (BUSPAR) tablet 5 mg (5 mg Oral Given 12/16/21 1741)   ?escitalopram (LEXAPRO) tablet 20 mg (20 mg Oral Given 12/16/21 1741)  ?zolpidem (AMBIEN) tablet 5 mg (5 mg Oral Given 12/16/21 2140)  ?docusate sodium (COLACE) capsule 100 mg (has no administration in time range)  ?pantoprazole (PROTONIX) EC tablet 40 mg (40 mg Oral Given 12/16/21 1741)  ?morphine (PF) 4 MG/ML injection 4 mg (4 mg Intravenous Given 12/16/21 0906)  ?ketorolac (TORADOL) 30  MG/ML injection 15 mg (15 mg Intravenous Given 12/16/21 0918)  ?morphine (PF) 2 MG/ML injection 2 mg (2 mg Intravenous Given 12/16/21 0919)  ?lidocaine-EPINEPHrine (XYLOCAINE W/EPI) 2 %-1:100000 (with pres) injection 20 mL (20 mLs Intradermal Given 12/16/21 1100)  ?sodium chloride 0.9 % bolus 1,000 mL (0 mLs Intravenous Stopped 12/16/21 1342)  ?ceFEPIme (MAXIPIME) 2 g in sodium chloride 0.9 % 100 mL IVPB (0 g Intravenous Stopped 12/16/21 1342)  ?vancomycin (VANCOCIN) IVPB 1000 mg/200 mL premix (1,000 mg Intravenous New Bag/Given 12/16/21 1629)  ?vancomycin (VANCOREADY) IVPB 500 mg/100 mL (500 mg Intravenous New Bag/Given 12/16/21 1450)  ? ? ? ?IMPRESSION / MDM / ASSESSMENT AND PLAN / ED COURSE  ?I reviewed the triage vital signs and the nursing notes. ?             ?               ? ? ?The patient is on the cardiac monitor to evaluate for evidence of arrhythmia and/or significant heart rate changes. ? ? ?Ddx:  ?Septic arthritis, acute psoriatic arthritis, traumatic joint effusion, ligamentous sprain ? ? ?MDM:  ?71 yo F here with acute, severe, R knee pain. H/o prior knee arthroscopy c/b infection at skin site. While the wound at the prior port site appears to be healing, exam and history highly c/f acute septic arthritis. She is exquisitely tender with any ROM of knee. WBC 16.4k, concerning for infection. ESR normal, but CRP is pending. Does not meet sepsis criteria but c/f infection especially given her immunosuppressed status, though she's been off Humira due to her recent issues with the arthrocentesis. ? ?Discussed case with Dr. Sharlet Salina of  Orthopedics. Based on this discussion, he recommended arthrocentesis which was performed by me using sterile technique as above. Cloudy, yellow fluid obtained concerning for infectious arthritis. WBC>50k. Pt placed on empiric

## 2021-12-16 NOTE — H&P (Signed)
?History and Physical  ? ? ?Lydia Elliott RDE:[id number] DOB: [birth year] DOA: 12/16/2021 ? ?Referring MD/NP/PA:  ? ?PCP: Leonel Ramsay, MD  ? ?Patient coming from:  The patient is coming from home.  At baseline, pt is independent for most of ADL.       ? ?Chief Complaint: right knee pain ? ?HPI: Lydia Elliott is a 71 y.o. female with medical history significant of hypertension, hyperlipidemia, GERD, depression with anxiety, anemia, thalassemia, Takotsubo cardiomyopathy, Psoriatec arthritis, IBS, CAD, who presents with right knee pain. ? ?She has a complex history of recent arthroscopy of her right knee, complicated by infection. She does not have any hardware in her knee. Her last procedure was in March, when she had an arthroscopy that showed no joint infection or inflammation, and she has been off antibiotics since then. She states that she has developed right knee pain since last night, which is constant, moderate to severe, sharp, nonradiating. Denies any direct trauma.  Denies fever or chills.  No cough, shortness breath, chest pain.  No nausea, vomiting, diarrhea or abdominal pain.  No symptoms of UTI. Of note, she has a history of psoriatic arthritis. He was on prednisone and Humira, but has been holding this in the setting of her previous infection site at the right port site from her arthrocentesis.   ? ?Dr.Issacs of ED collected synovial fluid which showed yellow color, cloudy appearance, no crystals, neutrophils 79, lymphocyte 21, WBC 57058.  ? ?Data Reviewed and ED Course: pt was found to have WBC 16.4, GFR> 60, temperature normal, blood pressure 115/50, heart rate 95, RR 20, oxygen saturation 95% on room air.  X-ray of right knee showed small effusion.  Patient is admitted to [place] bed as inpatient.  Orthopedic surgery is consulted. ? ? ?EKG: Not done in ED, will get one. ? ? ?Review of Systems:  ? ?General: no fevers, chills, no body weight gain, has fatigue ?HEENT: no blurry vision, hearing  changes or sore throat ?Respiratory: no dyspnea, coughing, wheezing ?CV: no chest pain, no palpitations ?GI: no nausea, vomiting, abdominal pain, diarrhea, constipation ?GU: no dysuria, burning on urination, increased urinary frequency, hematuria  ?Ext: no leg edema ?Neuro: no unilateral weakness, numbness, or tingling, no vision change or hearing loss ?Skin: no rash, no skin tear. ?MSK: No muscle spasm, no deformity, no limitation of range of movement in spin. Has right knee pain. ?Heme: No easy bruising.  ?Travel history: No recent long distant travel. ? ? ?Allergy:  ?Allergies  ?Allergen Reactions  ? Rosuvastatin   ?  Muscle Pain  ? ? ?Past Medical History:  ?Diagnosis Date  ? Anemia   ? Anxiety   ? a.) on BZO  ? Aortic atherosclerosis ([place])   ? Arthritis   ? hands, knees, toes  ? Bone island 10/15/2016  ? a.) RIGHT humeral head  ? CAD (coronary artery disease)   ? a.) 2010 --> PCI with stent placement x 1 (type location unknown)  ? Cataract   ? Depression   ? GERD (gastroesophageal reflux disease)   ? Hyperlipidemia   ? Hypertension   ? IBS (irritable bowel syndrome)   ? Insomnia   ? a.) on zolpidem  ? Nodule of right lung 10/18/2016  ? Psoriatic arthritis ([place])   ? Takotsubo cardiomyopathy   ? Thalassemia   ? Vertigo   ? none - 10 yrs  ? Wears dentures   ? partial lower  ? ? ?Past Surgical History:  ?Procedure Laterality Date  ?  APPENDECTOMY  08/12/1965  ? CARPAL TUNNEL RELEASE  08/12/2005  ? CATARACT EXTRACTION  08/13/2011  ? CHOLECYSTECTOMY  08/12/1998  ? COLONOSCOPY  03/11/2014  ? Dr.Wohl  ? COLONOSCOPY WITH PROPOFOL N/A 12/11/2015  ? Procedure: COLONOSCOPY WITH PROPOFOL;  Surgeon: Lucilla Lame, MD;  Location: [facility];  Service: Endoscopy;  Laterality: N/A;  DESCENDING COLON POLYP ?  ? CORONARY ANGIOPLASTY WITH STENT PLACEMENT  08/12/2008  ? ESOPHAGOGASTRODUODENOSCOPY  02/21/2014  ? Dr.Wohl  ? ESOPHAGOGASTRODUODENOSCOPY (EGD) WITH PROPOFOL N/A 12/11/2015  ? Procedure: ESOPHAGOGASTRODUODENOSCOPY  (EGD) WITH PROPOFOL;  Surgeon: Lucilla Lame, MD;  Location: [facility];  Service: Endoscopy;  Laterality: N/A;  ? KNEE ARTHROSCOPY Right 10/04/2021  ? Procedure: ARTHROSCOPY KNEE;  Surgeon: Thornton Park, MD;  Location: [facility];  Service: Orthopedics;  Laterality: Right;  ? KNEE ARTHROSCOPY Right 10/30/2021  ? Procedure: ARTHROSCOPY KNEE, I&D OF SUPERFICIAL ABSCESS;  Surgeon: Thornton Park, MD;  Location: [facility];  Service: Orthopedics;  Laterality: Right;  ? POLYPECTOMY N/A 12/11/2015  ? Procedure: POLYPECTOMY;  Surgeon: Lucilla Lame, MD;  Location: [facility];  Service: Endoscopy;  Laterality: N/A;  ? TONSILLECTOMY    ? TONSILLECTOMY AND ADENOIDECTOMY  08/13/1967  ? ? ?Social History:  reports that she quit smoking about 51 years ago. Her smoking use included cigarettes. She has a 0.50 pack-year smoking history. She has never used smokeless tobacco. She reports current alcohol use of about 1.0 standard drink per week. She reports that she does not use drugs. ? ?Family History:  ?Family History  ?Problem Relation Age of Onset  ? Cirrhosis Mother   ? Hepatitis Mother   ? Thalassemia Mother   ? Diabetes Mother   ? Heart disease Father   ? Diabetes Father   ? Hyperlipidemia Father   ? Hypertension Father   ? Stroke Father   ? Cancer Sister   ?     lung  ? Diabetes Sister   ? Stroke Sister   ?     during a surgery  ? Diabetes Brother   ? Heart disease Brother   ? Hypertension Brother   ? Stroke Brother   ? Thalassemia Maternal Grandmother   ? COPD Neg Hx   ?  ? ?Prior to Admission medications   ?Medication Sig Start Date End Date Taking? Authorizing Provider  ?ALPRAZolam (XANAX) 0.25 MG tablet Take 0.25 mg by mouth 2 (two) times daily as needed for anxiety.    [provider]  ?aspirin EC 325 MG tablet Take 1 tablet (325 mg total) by mouth daily. 10/04/21   Thornton Park, MD  ?atorvastatin (LIPITOR) 20 MG tablet Take 20 mg by mouth daily. 09/05/21   [provider]  ?busPIRone  (BUSPAR) 5 MG tablet Take 5 mg by mouth daily. 01/25/20   [provider]  ?carvedilol (COREG) 3.125 MG tablet Take 3.125 mg by mouth 2 (two) times daily with a meal. 03/15/16   [provider]  ?diclofenac Sodium (VOLTAREN) 1 % GEL Apply 1 application topically 2 (two) times daily as needed (pain).    [provider]  ?docusate sodium (COLACE) 100 MG capsule Take 1 capsule (100 mg total) by mouth daily as needed. 10/04/21 10/04/22  Thornton Park, MD  ?escitalopram (LEXAPRO) 20 MG tablet TAKE 1 TABLET BY MOUTH EVERY DAY 11/10/19   Delsa Grana, PA-C  ?HYDROcodone-acetaminophen (NORCO) 5-325 MG tablet Take 1-2 tablets by mouth every 4 (four) hours as needed for moderate pain. 10/04/21   Thornton Park, MD  ?  losartan (COZAAR) 25 MG tablet Take 25 mg by mouth 2 (two) times a day.  08/03/18   [provider]  ?omeprazole (PRILOSEC) 20 MG capsule Take 20 mg by mouth daily.    [provider]  ?zolpidem (AMBIEN) 10 MG tablet Take 5 mg by mouth at bedtime as needed for sleep. 05/19/15   [provider]  ? ? ?Physical Exam: ?Vitals:  ? 12/16/21 1234 12/16/21 1300 12/16/21 1330 12/16/21 1441  ?BP:  (!) 126/46 (!) 137/55 (!) 140/57  ?Pulse:  84 90 88  ?Resp:  16  18  ?Temp:    98.7 ?F (37.1 ?C)  ?TempSrc:      ?SpO2:  94%  97%  ?Weight: 67.6 kg     ?Height:      ? ?General: Not in acute distress ?HEENT: ?      Eyes: PERRL, EOMI, no scleral icterus. ?      ENT: No discharge from the ears and nose, no pharynx injection, no tonsillar enlargement.  ?      Neck: No JVD, no bruit, no mass felt. ?Heme: No neck lymph node enlargement. ?Cardiac: S1/S2, RRR, No murmurs, No gallops or rubs. ?Respiratory: No rales, wheezing, rhonchi or rubs. ?GI: Soft, nondistended, nontender, no rebound pain, no organomegaly, BS present. ?GU: No hematuria ?Ext: No pitting leg edema bilaterally. 1+DP/PT pulse bilaterally. ?Musculoskeletal:  Has right knee tenderness, with mild effusion and warmth, no  significant redness, there is a healing hole-like wound in the lateral side of right knee, no fluctuance. ? ?Skin: No rashes.  ?Neuro: Alert, oriented X3, cranial nerves II-XII grossly intact, moves all extrem

## 2021-12-16 NOTE — Assessment & Plan Note (Signed)
-   Aspirin, Lipitor  

## 2021-12-16 NOTE — Assessment & Plan Note (Signed)
-   Continue home medications 

## 2021-12-16 NOTE — Consult Note (Signed)
Pharmacy Antibiotic Note ? ?Lydia Elliott is a 71 y.o. female admitted on 12/16/2021 with  septic joint .  Pharmacy has been consulted for Cefepime and Vancomycin dosing. ? ?Plan: ?Cefepime 2gm q 12 hours ?Vancomycin '1500mg'$  as loading dose give in ED, then ?Vancomycin 1500 mg IV Q 36hrs. ?Goal AUC 400-550. ?Expected AUC: 479 ?Calculated Css max 39.2 ?Calculated Css min 8.9 ?SCr used: 0.8 (actual 0.72) ? ? ?Height: 5' (152.4 cm) ?Weight: 67.6 kg (149 lb) ?IBW/kg (Calculated) : 45.5 ? ?Temp (24hrs), Avg:98.7 ?F (37.1 ?C), Min:98.7 ?F (37.1 ?C), Max:98.7 ?F (37.1 ?C) ? ?Recent Labs  ?Lab 12/16/21 ?0913  ?WBC 16.4*  ?CREATININE 0.72  ?  ?Estimated Creatinine Clearance: 55.3 mL/min (by C-G formula based on SCr of 0.72 mg/dL).   ? ?Allergies  ?Allergen Reactions  ? Rosuvastatin   ?  Muscle Pain  ? ? ?Antimicrobials this admission: ?5/7 Vancomycin >>  ?5/7 Cefepime >>  ? ?Dose adjustments this admission: none ? ?Microbiology results: ?5/7 BCx: in process ?5/7 MRSA PCR: ordered ? ?Thank you for allowing pharmacy to be a part of this patient?s care. ? ?Roan Miklos Rodriguez-Guzman PharmD, BCPS ?12/16/2021 1:42 PM ? ? ?

## 2021-12-16 NOTE — Assessment & Plan Note (Signed)
Lipitor 

## 2021-12-16 NOTE — Assessment & Plan Note (Signed)
Thalassemia and microcytic anemia: Hemoglobin stable 10.6 (9.6 on 10/30/2021) ?-Follow-up with CBC ?

## 2021-12-17 ENCOUNTER — Encounter
Admission: EM | Disposition: A | Discharge status: Home / Self Care | Payer: Self-pay | Source: Home / Self Care | Attending: Internal Medicine

## 2021-12-17 ENCOUNTER — Inpatient Hospital Stay: Payer: PPO | Admitting: Registered Nurse

## 2021-12-17 ENCOUNTER — Other Ambulatory Visit: Payer: Self-pay

## 2021-12-17 ENCOUNTER — Encounter: Payer: Self-pay | Admitting: Internal Medicine

## 2021-12-17 DIAGNOSIS — M009 Pyogenic arthritis, unspecified: Secondary | ICD-10-CM | POA: Diagnosis not present

## 2021-12-17 DIAGNOSIS — F418 Other specified anxiety disorders: Secondary | ICD-10-CM | POA: Diagnosis not present

## 2021-12-17 DIAGNOSIS — I2510 Atherosclerotic heart disease of native coronary artery without angina pectoris: Secondary | ICD-10-CM

## 2021-12-17 DIAGNOSIS — E785 Hyperlipidemia, unspecified: Secondary | ICD-10-CM | POA: Diagnosis not present

## 2021-12-17 HISTORY — PX: INCISION AND DRAINAGE: SHX5863

## 2021-12-17 LAB — CBC
HCT: 29.6 % — ABNORMAL LOW (ref 36.0–46.0)
Hemoglobin: 8.8 g/dL — ABNORMAL LOW (ref 12.0–15.0)
MCH: 20.2 pg — ABNORMAL LOW (ref 26.0–34.0)
MCHC: 29.7 g/dL — ABNORMAL LOW (ref 30.0–36.0)
MCV: 68 fL — ABNORMAL LOW (ref 80.0–100.0)
Platelets: 206 10*3/uL (ref 150–400)
RBC: 4.35 MIL/uL (ref 3.87–5.11)
RDW: 15.7 % — ABNORMAL HIGH (ref 11.5–15.5)
WBC: 7.5 10*3/uL (ref 4.0–10.5)
nRBC: 0 % (ref 0.0–0.2)

## 2021-12-17 LAB — CREATININE, SERUM
Creatinine, Ser: 0.65 mg/dL (ref 0.44–1.00)
GFR, Estimated: >60 mL/min (ref >60)

## 2021-12-17 SURGERY — INCISION AND DRAINAGE
Anesthesia: General | Site: Knee | Laterality: Right

## 2021-12-17 MED ORDER — ONDANSETRON HCL 4 MG/2ML IJ SOLN
INTRAMUSCULAR | Status: DC | PRN
Start: 2021-12-17 — End: 2021-12-17
  Administered 2021-12-17: 4 mg via INTRAVENOUS

## 2021-12-17 MED ORDER — FENTANYL CITRATE (PF) 100 MCG/2ML IJ SOLN
INTRAMUSCULAR | Status: DC | PRN
  Administered 2021-12-17: 50 ug via INTRAVENOUS
  Administered 2021-12-17 (×2): 25 ug via INTRAVENOUS

## 2021-12-17 MED ORDER — DEXMEDETOMIDINE HCL IN NACL 200 MCG/50ML IV SOLN
INTRAVENOUS | Status: DC | PRN
  Administered 2021-12-17 (×2): 8 ug via INTRAVENOUS

## 2021-12-17 MED ORDER — LIDOCAINE HCL (PF) 1 % IJ SOLN
INTRAMUSCULAR | Status: AC
  Filled 2021-12-17: qty 30

## 2021-12-17 MED ORDER — PHENYLEPHRINE 80 MCG/ML (10ML) SYRINGE FOR IV PUSH (FOR BLOOD PRESSURE SUPPORT)
PREFILLED_SYRINGE | INTRAVENOUS | Status: AC
  Filled 2021-12-17: qty 10

## 2021-12-17 MED ORDER — LACTATED RINGERS IV SOLN: INTRAVENOUS | Status: DC | PRN

## 2021-12-17 MED ORDER — RINGERS IRRIGATION IR SOLN
Status: DC | PRN
  Administered 2021-12-17: 15000 mL

## 2021-12-17 MED ORDER — ONDANSETRON HCL 4 MG/2ML IJ SOLN
INTRAMUSCULAR | Status: AC
Start: 2021-12-17 — End: ?
  Filled 2021-12-17: qty 2

## 2021-12-17 MED ORDER — FENTANYL CITRATE (PF) 100 MCG/2ML IJ SOLN
INTRAMUSCULAR | Status: AC
  Filled 2021-12-17: qty 2

## 2021-12-17 MED ORDER — PROPOFOL 10 MG/ML IV BOLUS
INTRAVENOUS | Status: AC
  Filled 2021-12-17: qty 20

## 2021-12-17 MED ORDER — VANCOMYCIN HCL 1500 MG/300ML IV SOLN
1500.0000 mg | INTRAVENOUS | Status: DC
  Administered 2021-12-18: 1500 mg via INTRAVENOUS
  Filled 2021-12-17: qty 300

## 2021-12-17 MED ORDER — BUPIVACAINE-EPINEPHRINE (PF) 0.25% -1:200000 IJ SOLN
INTRAMUSCULAR | Status: AC
  Filled 2021-12-17: qty 30

## 2021-12-17 MED ORDER — EPHEDRINE SULFATE (PRESSORS) 50 MG/ML IJ SOLN
INTRAMUSCULAR | Status: DC | PRN
  Administered 2021-12-17: 5 mg via INTRAVENOUS
  Administered 2021-12-17: 10 mg via INTRAVENOUS
  Administered 2021-12-17 (×2): 5 mg via INTRAVENOUS
  Administered 2021-12-17: 10 mg via INTRAVENOUS

## 2021-12-17 MED ORDER — LIDOCAINE HCL 1 % IJ SOLN
INTRAMUSCULAR | Status: DC | PRN
  Administered 2021-12-17: 5 mL

## 2021-12-17 MED ORDER — LIDOCAINE HCL (CARDIAC) PF 100 MG/5ML IV SOSY
PREFILLED_SYRINGE | INTRAVENOUS | Status: DC | PRN
  Administered 2021-12-17: 60 mg via INTRAVENOUS

## 2021-12-17 MED ORDER — OXYCODONE HCL 5 MG PO TABS: 5.0000 mg | ORAL_TABLET | Freq: Once | ORAL | Status: DC | PRN

## 2021-12-17 MED ORDER — 0.9 % SODIUM CHLORIDE (POUR BTL) OPTIME
TOPICAL | Status: DC | PRN
  Administered 2021-12-17: 1000 mL

## 2021-12-17 MED ORDER — ENOXAPARIN SODIUM 40 MG/0.4ML IJ SOSY
40.0000 mg | PREFILLED_SYRINGE | INTRAMUSCULAR | Status: DC
  Administered 2021-12-19: 40 mg via SUBCUTANEOUS
  Filled 2021-12-17 (×3): qty 0.4

## 2021-12-17 MED ORDER — FENTANYL CITRATE (PF) 100 MCG/2ML IJ SOLN
25.0000 ug | INTRAMUSCULAR | Status: DC | PRN
  Administered 2021-12-17: 50 ug via INTRAVENOUS

## 2021-12-17 MED ORDER — PROPOFOL 10 MG/ML IV BOLUS
INTRAVENOUS | Status: DC | PRN
  Administered 2021-12-17: 50 mg via INTRAVENOUS
  Administered 2021-12-17: 150 mg via INTRAVENOUS

## 2021-12-17 MED ORDER — OXYCODONE HCL 5 MG/5ML PO SOLN: 5.0000 mg | Freq: Once | ORAL | Status: DC | PRN

## 2021-12-17 MED ORDER — MORPHINE SULFATE (PF) 2 MG/ML IV SOLN: 1.0000 mg | INTRAVENOUS | Status: DC | PRN

## 2021-12-17 MED ORDER — EPHEDRINE 5 MG/ML INJ
INTRAVENOUS | Status: AC
  Filled 2021-12-17: qty 15

## 2021-12-17 MED ORDER — HYDROCODONE-ACETAMINOPHEN 5-325 MG PO TABS
1.0000 | ORAL_TABLET | ORAL | Status: DC | PRN
  Administered 2021-12-18 – 2021-12-19 (×3): 1 via ORAL
  Filled 2021-12-17 (×2): qty 1
  Filled 2021-12-17: qty 2

## 2021-12-17 MED ORDER — HYDROMORPHONE HCL 1 MG/ML IJ SOLN
INTRAMUSCULAR | Status: DC | PRN
  Administered 2021-12-17 (×2): .5 mg via INTRAVENOUS

## 2021-12-17 MED ORDER — BUPIVACAINE-EPINEPHRINE 0.25% -1:200000 IJ SOLN
INTRAMUSCULAR | Status: DC | PRN
  Administered 2021-12-17: 30 mL

## 2021-12-17 MED ORDER — FENTANYL CITRATE (PF) 100 MCG/2ML IJ SOLN
INTRAMUSCULAR | Status: AC
Start: 2021-12-17 — End: 2021-12-18
  Filled 2021-12-17: qty 2

## 2021-12-17 MED ORDER — PROMETHAZINE HCL 25 MG/ML IJ SOLN
INTRAMUSCULAR | Status: AC
  Administered 2021-12-17: 6.25 mg via INTRAMUSCULAR
  Filled 2021-12-17: qty 1

## 2021-12-17 MED ORDER — HYDROMORPHONE HCL 1 MG/ML IJ SOLN
INTRAMUSCULAR | Status: AC
  Filled 2021-12-17: qty 1

## 2021-12-17 SURGICAL SUPPLY — 51 items
ADAPTER IRRIG TUBE 2 SPIKE SOL (ADAPTER) ×4 IMPLANT
BLADE FULL RADIUS 3.5 (BLADE) IMPLANT
BLADE SHAVER 4.5X7 STR FR (MISCELLANEOUS) IMPLANT
BUR BR 5.5 WIDE MOUTH (BURR) IMPLANT
CNTNR SPEC 2.5X3XGRAD LEK (MISCELLANEOUS)
CONT SPEC 4OZ STER OR WHT (MISCELLANEOUS)
CONTAINER SPEC 2.5X3XGRAD LEK (MISCELLANEOUS) IMPLANT
COOLER POLAR GLACIER W/PUMP (MISCELLANEOUS) IMPLANT
CUFF TOURN SGL QUICK 24 (TOURNIQUET CUFF)
CUFF TOURN SGL QUICK 34 (TOURNIQUET CUFF)
CUFF TRNQT CYL 24X4X16.5-23 (TOURNIQUET CUFF) IMPLANT
CUFF TRNQT CYL 34X4.125X (TOURNIQUET CUFF) IMPLANT
DEVICE SUCT BLK HOLE OR FLOOR (MISCELLANEOUS) IMPLANT
DRAPE ARTHRO LIMB 89X125 STRL (DRAPES) ×2 IMPLANT
DRAPE IMP U-DRAPE 54X76 (DRAPES) ×2 IMPLANT
DURAPREP 26ML APPLICATOR (WOUND CARE) ×6 IMPLANT
GAUZE SPONGE 4X4 12PLY STRL (GAUZE/BANDAGES/DRESSINGS) ×2 IMPLANT
GAUZE XEROFORM 1X8 LF (GAUZE/BANDAGES/DRESSINGS) ×2 IMPLANT
GLOVE SURG ORTHO LTX SZ9 (GLOVE) ×4 IMPLANT
GLOVE SURG UNDER POLY LF SZ9 (GLOVE) ×2 IMPLANT
GOWN STRL REUS TWL 2XL XL LVL4 (GOWN DISPOSABLE) ×2 IMPLANT
GOWN STRL REUS W/ TWL LRG LVL3 (GOWN DISPOSABLE) ×1 IMPLANT
GOWN STRL REUS W/TWL LRG LVL3 (GOWN DISPOSABLE) ×1
IV LACTATED RINGER IRRG 3000ML (IV SOLUTION) ×5
IV LR IRRIG 3000ML ARTHROMATIC (IV SOLUTION) ×4 IMPLANT
KIT TURNOVER KIT A (KITS) ×2 IMPLANT
MANIFOLD NEPTUNE II (INSTRUMENTS) ×4 IMPLANT
MAT ABSORB  FLUID 56X50 GRAY (MISCELLANEOUS) ×2
MAT ABSORB FLUID 56X50 GRAY (MISCELLANEOUS) ×2 IMPLANT
NEEDLE HYPO 22GX1.5 SAFETY (NEEDLE) ×2 IMPLANT
PACK ARTHROSCOPY KNEE (MISCELLANEOUS) ×2 IMPLANT
PAD ABD DERMACEA PRESS 5X9 (GAUZE/BANDAGES/DRESSINGS) ×4 IMPLANT
PAD WRAPON POLAR KNEE (MISCELLANEOUS) ×1 IMPLANT
SHAVER BLADE BONE CUTTER  5.5 (BLADE)
SHAVER BLADE BONE CUTTER 4.5 (BLADE) ×2 IMPLANT
SHAVER BLADE BONE CUTTER 5.5 (BLADE) IMPLANT
SHAVER BLADE TAPERED BLUNT 4 (BLADE) ×2 IMPLANT
SOL PREP PVP 2OZ (MISCELLANEOUS)
SOLUTION PREP PVP 2OZ (MISCELLANEOUS) IMPLANT
SPONGE T-LAP 18X18 ~~LOC~~+RFID (SPONGE) ×3 IMPLANT
STRIP CLOSURE SKIN 1/2X4 (GAUZE/BANDAGES/DRESSINGS) ×2 IMPLANT
SUT ETHILON 4-0 (SUTURE) ×1
SUT ETHILON 4-0 FS2 18XMFL BLK (SUTURE) ×1
SUT MNCRL 3-0 UNDYED SH (SUTURE) IMPLANT
SUT MONOCRYL 3-0 UNDYED (SUTURE) ×1
SUTURE ETHLN 4-0 FS2 18XMF BLK (SUTURE) ×1 IMPLANT
TUBING INFLOW SET DBFLO PUMP (TUBING) ×2 IMPLANT
TUBING OUTFLOW SET DBLFO PUMP (TUBING) ×2 IMPLANT
WAND WEREWOLF FLOW 90D (MISCELLANEOUS) ×2 IMPLANT
WATER STERILE IRR 500ML POUR (IV SOLUTION) ×2 IMPLANT
WRAPON POLAR PAD KNEE (MISCELLANEOUS) ×2

## 2021-12-17 NOTE — Progress Notes (Signed)
Lydia Elliott at [facility] ? ? ?PATIENT NAME: Lydia Elliott   ? ?MR#:  [id number] ? ?DATE OF BIRTH:  15-Feb-1951 ? ?SUBJECTIVE:  ? ?patient came in with right knee swelling since Saturday night denies any recent injury. She had previous right knee arthroscopy for partial medial meniscectomy in February 6440 complicated by subcutaneous abscess treated with arthroscopic wash out with IND and completed a course of antibiotic. ? ?No fever. No family at bedside. Patient has history of psoriatic arthritis and was started on humira and prednisone by outpatient rheumatology ? ? ?VITALS:  ?Blood pressure 115/61, pulse 84, temperature 98.3 ?F (36.8 ?C), temperature source Oral, resp. rate 18, height 5' (1.524 m), weight 67.6 kg, SpO2 93 %. ? ?PHYSICAL EXAMINATION:  ? ?GENERAL:  71 y.o.-year-old patient lying in the bed with no acute distress.  ?LUNGS: Normal breath sounds bilaterally, no wheezing, rales, rhonchi.  ?CARDIOVASCULAR: S1, S2 normal. No murmurs, rubs, or gallops.  ?ABDOMEN: Soft, nontender, nondistended. Bowel sounds present.  ?EXTREMITIES: right knee swelling ? ?NEUROLOGIC: nonfocal  patient is alert and awake ?SKIN: as above ? ?LABORATORY PANEL:  ?CBC ?Recent Labs  ?Lab 12/17/21 ?3474  ?WBC 7.5  ?HGB 8.8*  ?HCT 29.6*  ?PLT 206  ? ? ?Chemistries  ?Recent Labs  ?Lab 12/16/21 ?0913 12/17/21 ?2595  ?NA 139  --   ?K 3.7  --   ?CL 105  --   ?CO2 24  --   ?GLUCOSE 113*  --   ?BUN 17  --   ?CREATININE 0.72 0.65  ?CALCIUM 8.9  --   ?AST 17  --   ?ALT 14  --   ?ALKPHOS 75  --   ?BILITOT 1.4*  --   ? ?Cardiac Enzymes ?No results for input(s): TROPONINI in the last 168 hours. ?RADIOLOGY:  ?DG Knee Complete 4 Views Right ? ?Result Date: 12/16/2021 ?CLINICAL DATA:  71 year old female with increased knee pain and swelling. Status post knee surgery last month. EXAM: RIGHT KNEE - COMPLETE 4+ VIEW COMPARISON:  None Available. FINDINGS: Patellofemoral compartment joint space loss and some degenerative  spurring. Medial and lateral joint spaces better maintained. Patella appears intact. Increased density in Hoffa's fat pad, possible small joint effusion. No soft tissue gas. No acute osseous abnormality identified. IMPRESSION: 1. Patellofemoral compartment degeneration with superimposed joint space granulation versus small effusion. 2. No acute osseous abnormality identified. Electronically Signed   By: Genevie Ann M.D.   On: 12/16/2021 10:05   ? ?Assessment and Plan ? [name] is a 71 y.o. female with medical history significant of hypertension, hyperlipidemia, GERD, depression with anxiety, anemia, thalassemia, Takotsubo cardiomyopathy, Psoriatec arthritis, IBS, CAD, who presents with right knee pain. ?  ?She has a complex history of recent arthroscopy of her right knee, complicated by infection. She does not have any hardware in her knee. Her last procedure was in March, when she had an arthroscopy that showed no joint infection or inflammation, and she has been off antibiotics since then. ? ?Right knee swelling and fluid aspiration suggestive septic right knee joint ?history of right knee arthroscopy and repair for partial meniscal tear February 2023 ?history of right knee infection with arthroscopic washout and incision drainage of abscess March 2023 ?-- continue IV vancomycin and cefepime ?-- synovial fluid white count more than 57,000 predominantly neutrophil. ?-- Orthopedic consultation with Dr. Mack Guise-- patient is scheduled for right knee arthroscopic washout this afternoon ?-- blood culture negative ?-- MR SA PCR negative ?-- ID consultation with  Dr. Steva Ready ?-- PRN pain meds ? ?Depression with anxiety ?- Continue home medications ?  ?Thalassemia ?--Thalassemia and microcytic anemia: Hemoglobin stable 10.6 (9.6 on 10/30/2021) ?  ?CAD (coronary artery disease) ?- Aspirin, Lipitor ?  ?Hyperlipidemia ?- Lipitor ?  ?Hypertension ?- IV hydralazine as needed ?-Coreg, Cozaar ? ? ?Family communication :none  today ?Consults : orthopedic, ID ?CODE STATUS: full ?DVT Prophylaxis : Lovenox after procedure ?Level of care: Med-Surg ?Status is: Inpatient ?Remains inpatient appropriate because: right knee septic arthritis. Patient needs knee wash out and IV antibiotics. ?  ? ?TOTAL TIME TAKING CARE OF THIS PATIENT: 35 minutes.  ?>50% time spent on counselling and coordination of care ? ?Note: This dictation was prepared with Dragon dictation along with smaller phrase technology. Any transcriptional errors that result from this process are unintentional. ? ?Fritzi Mandes M.D  ? ? ?[facility]  ? ?CC: ?Primary care physician; Leonel Ramsay, MD  ?

## 2021-12-17 NOTE — Anesthesia Procedure Notes (Signed)
Procedure Name: LMA Insertion ?Date/Time: 12/17/2021 5:47 PM ?Performed by: Kerri Perches, CRNA ?Pre-anesthesia Checklist: Patient identified, Patient being monitored, Timeout performed, Emergency Drugs available and Suction available ?Patient Re-evaluated:Patient Re-evaluated prior to induction ?Oxygen Delivery Method: Circle system utilized ?Preoxygenation: Pre-oxygenation with 100% oxygen ?Induction Type: IV induction ?LMA: LMA inserted ?LMA Size: 3.0 ?Tube type: Oral ?Number of attempts: 1 ?Placement Confirmation: positive ETCO2 and breath sounds checked- equal and bilateral ?Tube secured with: Tape ?Dental Injury: Teeth and Oropharynx as per pre-operative assessment  ? ? ? ? ?

## 2021-12-17 NOTE — Consult Note (Signed)
NAME: Khiana Camino  ?DOB: Jun 13, 1951  ?MRN: [id number]  ?Date/Time: 12/17/2021 12:24 PM ? ?REQUESTING PROVIDER: Dr.patel ?Subjective:  ?REASON FOR CONSULT: rt knee septic arthritis ?? ?Allyson Tineo is a 71 y.o. with a history of thalassemia, anemia, CAD, takotsubo cardiomyopathy, anxiety, depression recently diagnosed psoriatic arthritis on humira and prednisone presents with severe pain of right knee and swelling of few days duration. ?Patient has had bilateral knee pain for the past many years.  She has been receiving intra-articular steroid injection every 6 months.  Recently she was referred to rheumatology and was diagnosed with psoriatic arthritis.  She was started on prednisone and tumor necrosis factor inhibitor.  Her last dose of Humira was in February. ?Because of persistent right knee pain she was referred to orthopedic by her rheumatologist and underwent right knee arthroscopy on 10/04/2021.  The findings were tricompartmental chondromalacia with mild synovitis.  Small lateral meniscal tear and chondrocalcinosis.  She underwent partial lateral meniscectomy and limited synovectomy and lateral release.  She also underwent tricompartmental chondroplasty.  Following this procedure she has had discharge from the medial aspect of the arthroscopic entry point.  This was persistent in spite of oral antibiotics.  She says she was given doxycycline.  She was taken back for subcutaneous abscess drainage on 10/30/2021 and underwent right knee incision and drainage of the right medial portal with diagnostic arthroscopy with lavage.  Culture was Staph epidermidis.she received 10 days of cipro . It looks like she got better and on 4/19 Dr.krasinski had referred for PT. She apparently had a blister and ulceration sand it was covered with scab. She also had a fall and bruised her left arm ?She saw her PCP Dr.Fitzgerald on 12/07/21 ?She presented to the ED on 12/16/2021 with severe right knee pain and swelling that started  that morning.  She could not walk and hence had to call EMS and was brought in. ?In the ED vitals temperature 99.2, BP 132/51, pulse 84, sats 92%. ?WBC 16.4, Hb 10.6, platelet 293 and creatinine 0.72.  She had a fusion in the right knee and it was aspirated in the ED. ?WBC 57,058.  Neutrophils 79%.  No crystals.  Culture sent.  She was started on vancomycin and cefepime. ?X-ray of the right knee showed patellofemoral compartment degeneration with superimposed joint space granulation was a small effusion. ?I am asked to see the patient for antibiotic management for septic arthritis. ?She is going to be taken to the OR by Dr. Mack Guise.  I communicated with him to get fungal and AFB cultures as well as pathology. ?In February 2023 for a patient had a dental abscess and had to undergo root canal treatment ?she has a cat and has sustained some scratches and nips .None in the recent past ? ?Past Medical History:  ?Diagnosis Date  ? Anemia   ? Anxiety   ? a.) on BZO  ? Aortic atherosclerosis ([place])   ? Arthritis   ? hands, knees, toes  ? Bone island 10/15/2016  ? a.) RIGHT humeral head  ? CAD (coronary artery disease)   ? a.) 2010 --> PCI with stent placement x 1 (type location unknown)  ? Cataract   ? Depression   ? GERD (gastroesophageal reflux disease)   ? Hyperlipidemia   ? Hypertension   ? IBS (irritable bowel syndrome)   ? Insomnia   ? a.) on zolpidem  ? Nodule of right lung 10/18/2016  ? Psoriatic arthritis ([place])   ? Takotsubo cardiomyopathy   ? Thalassemia   ?  Vertigo   ? none - 10 yrs  ? Wears dentures   ? partial lower  ?  ?Past Surgical History:  ?Procedure Laterality Date  ? APPENDECTOMY  08/12/1965  ? CARPAL TUNNEL RELEASE  08/12/2005  ? CATARACT EXTRACTION  08/13/2011  ? CHOLECYSTECTOMY  08/12/1998  ? COLONOSCOPY  03/11/2014  ? Dr.Wohl  ? COLONOSCOPY WITH PROPOFOL N/A 12/11/2015  ? Procedure: COLONOSCOPY WITH PROPOFOL;  Surgeon: Lucilla Lame, MD;  Location: [place];  Service: Endoscopy;   Laterality: N/A;  DESCENDING COLON POLYP ?  ? CORONARY ANGIOPLASTY WITH STENT PLACEMENT  08/12/2008  ? ESOPHAGOGASTRODUODENOSCOPY  02/21/2014  ? Dr.Wohl  ? ESOPHAGOGASTRODUODENOSCOPY (EGD) WITH PROPOFOL N/A 12/11/2015  ? Procedure: ESOPHAGOGASTRODUODENOSCOPY (EGD) WITH PROPOFOL;  Surgeon: Lucilla Lame, MD;  Location: [place];  Service: Endoscopy;  Laterality: N/A;  ? KNEE ARTHROSCOPY Right 10/04/2021  ? Procedure: ARTHROSCOPY KNEE;  Surgeon: Thornton Park, MD;  Location: [facility];  Service: Orthopedics;  Laterality: Right;  ? KNEE ARTHROSCOPY Right 10/30/2021  ? Procedure: ARTHROSCOPY KNEE, I&D OF SUPERFICIAL ABSCESS;  Surgeon: Thornton Park, MD;  Location: [facility];  Service: Orthopedics;  Laterality: Right;  ? POLYPECTOMY N/A 12/11/2015  ? Procedure: POLYPECTOMY;  Surgeon: Lucilla Lame, MD;  Location: [place];  Service: Endoscopy;  Laterality: N/A;  ? TONSILLECTOMY    ? TONSILLECTOMY AND ADENOIDECTOMY  08/13/1967  ?  ?Social History  ? ?Socioeconomic History  ? Marital status: Widowed  ?  Spouse name: Not on file  ? Number of children: Not on file  ? Years of education: Not on file  ? Highest education level: Not on file  ?Occupational History  ? Not on file  ?Tobacco Use  ? Smoking status: Former  ?  Packs/day: 0.50  ?  Years: 1.00  ?  Pack years: 0.50  ?  Types: Cigarettes  ?  Quit date: 9  ?  Years since quitting: 51.3  ? Smokeless tobacco: Never  ? Tobacco comments:  ?  smoked occasionally  ?Vaping Use  ? Vaping Use: Never used  ?Substance and Sexual Activity  ? Alcohol use: Yes  ?  Alcohol/week: 1.0 standard drink  ?  Types: 1 Shots of liquor per week  ?  Comment: socially  ? Drug use: No  ? Sexual activity: Yes  ?Other Topics Concern  ? Not on file  ?Social History Narrative  ? Not on file  ? ?Social Determinants of Health  ? ?Financial Resource Strain: Not on file  ?Food Insecurity: Not on file  ?Transportation Needs: Not on file  ?Physical Activity: Not on file  ?Stress: Not on  file  ?Social Connections: Not on file  ?Intimate Partner Violence: Not on file  ?  ?Family History  ?Problem Relation Age of Onset  ? Cirrhosis Mother   ? Hepatitis Mother   ? Thalassemia Mother   ? Diabetes Mother   ? Heart disease Father   ? Diabetes Father   ? Hyperlipidemia Father   ? Hypertension Father   ? Stroke Father   ? Cancer Sister   ?     lung  ? Diabetes Sister   ? Stroke Sister   ?     during a surgery  ? Diabetes Brother   ? Heart disease Brother   ? Hypertension Brother   ? Stroke Brother   ? Thalassemia Maternal Grandmother   ? COPD Neg Hx   ? ?Allergies  ?Allergen Reactions  ? Rosuvastatin   ?  Muscle Pain  ? ?  I? ?Current Facility-Administered Medications  ?Medication Dose Route Frequency Provider Last Rate Last Admin  ? 0.9 %  sodium chloride infusion   Intravenous Continuous Ivor Costa, MD 75 mL/hr at 12/17/21 0531 New Bag at 12/17/21 0531  ? acetaminophen (TYLENOL) tablet 650 mg  650 mg Oral Q6H PRN Ivor Costa, MD   650 mg at 12/16/21 2140  ? ALPRAZolam Duanne Moron) tablet 0.25 mg  0.25 mg Oral BID PRN Ivor Costa, MD      ? aspirin EC tablet 325 mg  325 mg Oral Daily Ivor Costa, MD   325 mg at 12/17/21 0847  ? atorvastatin (LIPITOR) tablet 20 mg  20 mg Oral Daily Ivor Costa, MD   20 mg at 12/16/21 2140  ? busPIRone (BUSPAR) tablet 5 mg  5 mg Oral Daily Ivor Costa, MD   5 mg at 12/17/21 0847  ? carvedilol (COREG) tablet 3.125 mg  3.125 mg Oral BID WC Ivor Costa, MD   3.125 mg at 12/17/21 0847  ? ceFEPIme (MAXIPIME) 2 g in sodium chloride 0.9 % 100 mL IVPB  2 g Intravenous Q12H Ivor Costa, MD 200 mL/hr at 12/17/21 0854 2 g at 12/17/21 0854  ? docusate sodium (COLACE) capsule 100 mg  100 mg Oral Daily PRN Ivor Costa, MD      ? escitalopram (LEXAPRO) tablet 20 mg  20 mg Oral Daily Ivor Costa, MD   20 mg at 12/17/21 7858  ? hydrALAZINE (APRESOLINE) injection 5 mg  5 mg Intravenous Q2H PRN Ivor Costa, MD      ? HYDROcodone-acetaminophen (NORCO/VICODIN) 5-325 MG per tablet 1 tablet  1 tablet Oral Q4H PRN  Ivor Costa, MD   1 tablet at 12/17/21 1023  ? losartan (COZAAR) tablet 25 mg  25 mg Oral Daily Ivor Costa, MD   25 mg at 12/16/21 2140  ? morphine (PF) 2 MG/ML injection 1 mg  1 mg Intravenous Q4H PRN Ivor Costa,

## 2021-12-17 NOTE — Consult Note (Addendum)
?ORTHOPAEDIC CONSULTATION ? ?REQUESTING PHYSICIAN: Fritzi Mandes, MD ? ?Chief Complaint: Right knee pain and swelling ? ?HPI: ?Lydia Elliott is a 71 y.o. female presented to the [facility] ER after developing knee swelling and pain Saturday night.  Patient denied fevers or chills.  She denies recent injury.  Patient has had a previous right knee arthroscopy for partial medial meniscectomy on 3/81/01 which was complicated by a subcutaneous abscess below the medial portal incision.  Patient was treated with an arthroscopic washout  along with an I&D of the medial incision on 10/30/21 with antibiotics.  Culture showed rare Staph epidermidis.  Patient developed a blister over the lateral knee which developed into a small (1 cm ) lateral ulceration over the right knee.   Patient developed a large knee effusion and pain on Saturday night presented to the ER on Sunday.  She was admitted to the hospital service after aspiration revealed purulent fluid.  Cell count for the aspirated fluid was 57,000.  White blood cell count was 16.4.  ESR was 9.  Patient was seen in the office on 12/05/2021 and her knee showed no signs of infection.  Patient has a history of psoriatic arthritis and restarted on her Humira and prednisone last week. ? ?Past Medical History:  ?Diagnosis Date  ? Anemia   ? Anxiety   ? a.) on BZO  ? Aortic atherosclerosis ([place])   ? Arthritis   ? hands, knees, toes  ? Bone island 10/15/2016  ? a.) RIGHT humeral head  ? CAD (coronary artery disease)   ? a.) 2010 --> PCI with stent placement x 1 (type location unknown)  ? Cataract   ? Depression   ? GERD (gastroesophageal reflux disease)   ? Hyperlipidemia   ? Hypertension   ? IBS (irritable bowel syndrome)   ? Insomnia   ? a.) on zolpidem  ? Nodule of right lung 10/18/2016  ? Psoriatic arthritis ([place])   ? Takotsubo cardiomyopathy   ? Thalassemia   ? Vertigo   ? none - 10 yrs  ? Wears dentures   ? partial lower  ? ?Past Surgical History:  ?Procedure Laterality Date  ?  APPENDECTOMY  08/12/1965  ? CARPAL TUNNEL RELEASE  08/12/2005  ? CATARACT EXTRACTION  08/13/2011  ? CHOLECYSTECTOMY  08/12/1998  ? COLONOSCOPY  03/11/2014  ? Dr.Wohl  ? COLONOSCOPY WITH PROPOFOL N/A 12/11/2015  ? Procedure: COLONOSCOPY WITH PROPOFOL;  Surgeon: Lucilla Lame, MD;  Location: [place];  Service: Endoscopy;  Laterality: N/A;  DESCENDING COLON POLYP ?  ? CORONARY ANGIOPLASTY WITH STENT PLACEMENT  08/12/2008  ? ESOPHAGOGASTRODUODENOSCOPY  02/21/2014  ? Dr.Wohl  ? ESOPHAGOGASTRODUODENOSCOPY (EGD) WITH PROPOFOL N/A 12/11/2015  ? Procedure: ESOPHAGOGASTRODUODENOSCOPY (EGD) WITH PROPOFOL;  Surgeon: Lucilla Lame, MD;  Location: [place];  Service: Endoscopy;  Laterality: N/A;  ? KNEE ARTHROSCOPY Right 10/04/2021  ? Procedure: ARTHROSCOPY KNEE;  Surgeon: Thornton Park, MD;  Location: [facility];  Service: Orthopedics;  Laterality: Right;  ? KNEE ARTHROSCOPY Right 10/30/2021  ? Procedure: ARTHROSCOPY KNEE, I&D OF SUPERFICIAL ABSCESS;  Surgeon: Thornton Park, MD;  Location: [facility];  Service: Orthopedics;  Laterality: Right;  ? POLYPECTOMY N/A 12/11/2015  ? Procedure: POLYPECTOMY;  Surgeon: Lucilla Lame, MD;  Location: [place];  Service: Endoscopy;  Laterality: N/A;  ? TONSILLECTOMY    ? TONSILLECTOMY AND ADENOIDECTOMY  08/13/1967  ? ?Social History  ? ?Socioeconomic History  ? Marital status: Widowed  ?  Spouse name: Not on file  ? Number of  children: Not on file  ? Years of education: Not on file  ? Highest education level: Not on file  ?Occupational History  ? Not on file  ?Tobacco Use  ? Smoking status: Former  ?  Packs/day: 0.50  ?  Years: 1.00  ?  Pack years: 0.50  ?  Types: Cigarettes  ?  Quit date: 79  ?  Years since quitting: 51.3  ? Smokeless tobacco: Never  ? Tobacco comments:  ?  smoked occasionally  ?Vaping Use  ? Vaping Use: Never used  ?Substance and Sexual Activity  ? Alcohol use: Yes  ?  Alcohol/week: 1.0 standard drink  ?  Types: 1 Shots of liquor per week  ?   Comment: socially  ? Drug use: No  ? Sexual activity: Yes  ?Other Topics Concern  ? Not on file  ?Social History Narrative  ? Not on file  ? ?Social Determinants of Health  ? ?Financial Resource Strain: Not on file  ?Food Insecurity: Not on file  ?Transportation Needs: Not on file  ?Physical Activity: Not on file  ?Stress: Not on file  ?Social Connections: Not on file  ? ?Family History  ?Problem Relation Age of Onset  ? Cirrhosis Mother   ? Hepatitis Mother   ? Thalassemia Mother   ? Diabetes Mother   ? Heart disease Father   ? Diabetes Father   ? Hyperlipidemia Father   ? Hypertension Father   ? Stroke Father   ? Cancer Sister   ?     lung  ? Diabetes Sister   ? Stroke Sister   ?     during a surgery  ? Diabetes Brother   ? Heart disease Brother   ? Hypertension Brother   ? Stroke Brother   ? Thalassemia Maternal Grandmother   ? COPD Neg Hx   ? ?Allergies  ?Allergen Reactions  ? Rosuvastatin   ?  Muscle Pain  ? ?Prior to Admission medications   ?Medication Sig Start Date End Date Taking? Authorizing Provider  ?aspirin EC 325 MG tablet Take 1 tablet (325 mg total) by mouth daily. 10/04/21  Yes Thornton Park, MD  ?atorvastatin (LIPITOR) 20 MG tablet Take 20 mg by mouth daily. 09/05/21  Yes [provider]  ?busPIRone (BUSPAR) 5 MG tablet Take 5 mg by mouth daily. 01/25/20  Yes [provider]  ?carvedilol (COREG) 3.125 MG tablet Take 3.125 mg by mouth 2 (two) times daily with a meal. 03/15/16  Yes [provider]  ?diclofenac Sodium (VOLTAREN) 1 % GEL Apply 1 application topically 2 (two) times daily as needed (pain).   Yes [provider]  ?escitalopram (LEXAPRO) 20 MG tablet TAKE 1 TABLET BY MOUTH EVERY DAY 11/10/19  Yes Delsa Grana, PA-C  ?HYDROcodone-acetaminophen (NORCO) 5-325 MG tablet Take 1-2 tablets by mouth every 4 (four) hours as needed for moderate pain. 10/04/21  Yes Thornton Park, MD  ?losartan (COZAAR) 25 MG tablet Take 25 mg by mouth 2 (two) times a day.  08/03/18   Yes [provider]  ?omeprazole (PRILOSEC) 20 MG capsule Take 20 mg by mouth daily.   Yes [provider]  ?Adalimumab 40 MG/0.8ML PSKT Inject 0.8 mLs into the skin every 14 (fourteen) days.    [provider]  ?ALPRAZolam Duanne Moron) 0.25 MG tablet Take 0.25 mg by mouth 2 (two) times daily as needed for anxiety.    [provider]  ?docusate sodium (COLACE) 100 MG capsule Take 1 capsule (100 mg total)  by mouth daily as needed. 10/04/21 10/04/22  Thornton Park, MD  ?zolpidem (AMBIEN) 10 MG tablet Take 5 mg by mouth at bedtime as needed for sleep. 05/19/15   [provider]  ? ?DG Knee Complete 4 Views Right ? ?Result Date: 12/16/2021 ?CLINICAL DATA:  71 year old female with increased knee pain and swelling. Status post knee surgery last month. EXAM: RIGHT KNEE - COMPLETE 4+ VIEW COMPARISON:  None Available. FINDINGS: Patellofemoral compartment joint space loss and some degenerative spurring. Medial and lateral joint spaces better maintained. Patella appears intact. Increased density in Hoffa's fat pad, possible small joint effusion. No soft tissue gas. No acute osseous abnormality identified. IMPRESSION: 1. Patellofemoral compartment degeneration with superimposed joint space granulation versus small effusion. 2. No acute osseous abnormality identified. Electronically Signed   By: Genevie Ann M.D.   On: 12/16/2021 10:05   ? ?Positive ROS: All other systems have been reviewed and were otherwise negative with the exception of those mentioned in the HPI and as above. ? ?Physical Exam: ?General: Alert, no acute distress ? ?MUSCULOSKELETAL: Right knee: On examination at the bedside this morning, the patient has a moderate effusion.  The lateral ulceration shows fibropurulent material in the bed of the wound.  There is serous drainage on her dressings.  Distally the patient is neurovascular intact.  There is no swelling of the leg or thigh.  Compartments are soft.  Patient has mild to  moderate pain with knee range of motion. ? ?Assessment: ?Right knee swelling and fluid aspiration suggestive of septic right knee joint. ? ?Plan: ?Patient was admitted yesterday and started on IV antibioti

## 2021-12-17 NOTE — Op Note (Signed)
12/17/2021 ? ?7:51 PM ? ?PATIENT:  Lydia Elliott   ? ?PRE-OPERATIVE DIAGNOSIS: Right septic knee joint ? ?POST-OPERATIVE DIAGNOSIS:  Same ? ?PROCEDURE: Arthroscopic irrigation and debridement of right septic knee joint with open debridement of the lateral knee ulceration wound ? ?SURGEON:  Thornton Park, MD ? ?ANESTHESIA:   General ? ?PREOPERATIVE INDICATIONS:  Lydia Elliott is a 71 y.o. female presented to the [facility] ER after developing knee swelling and pain Saturday night.  Patient denied fevers or chills.  She denies recent injury.  Patient has had a previous right knee arthroscopy for partial medial meniscectomy on 1/95/09 which was complicated by a subcutaneous abscess below the medial portal incision.  Patient was treated with an arthroscopic washout  along with an I&D of the medial incision on 10/30/21 with antibiotics.  Culture showed rare Staph epidermidis.  Patient developed a blister over the lateral knee which developed into a small (1 cm ) lateral ulceration over the right knee.   Patient developed a large knee effusion and pain on Saturday night presented to the ER on Sunday.  She was admitted to the hospital service after aspiration revealed purulent fluid.  Cell count for the aspirated fluid was 57,000.  White blood cell count was 16.4.  ESR was 9.   ? ?I discussed the risks and benefits of surgery. The risks include but are not limited to persistent or recurrent infection, bleeding, nerve or blood vessel injury, joint stiffness or loss of motion, persistent pain, weakness or instability, and the need for further surgery.  Patient understood these risks and wished to proceed.  ? ?OPERATIVE FINDINGS: Right knee effusion with cloudy synovial fluid.  Patient had fibropurulent material in the lateral ulceration wound to the right knee. ? ?OPERATIVE PROCEDURE: Patient was met in the preoperative area.  The right knee was marked according to hospital's correct site of surgery protocol.  She was brought to  the operating room where she was placed supine on the operative table.  Patient had already been receiving antibiotics on the floor including vancomycin and cefepime.  Timeout was performed to verify the patient's name, date of birth, medical record number and correct site of surgery and correct procedure to be performed.  Once all in attendance were in agreement the case began. ? ?An 11 blade was used to establish a lateral portal in the right knee.  The arthroscopic cannula was placed through the lateral portal.  Synovial fluid was collected in the special cup for Gram stain and bacterial and fungal culture as well as AFB.  The arthroscope was placed through the lateral portal and a full diagnostic examination of the knee was undertaken.  Patient had cloudy synovial fluid but no overt purulence seen.  There was a few areas of adhesions and scar tissue.   ? ?An inferomedial portal was established under direct visualization using an 18-gauge spinal needle.  Samples of synovial tissue and the adhesions and scar tissue were collected using a pituitary grasper and sent for both culture and pathology as recommended by infectious disease. ? ?A synovectomy was performed using a 90 degree [employer] werewolf wand.  Adhesions and scar tissue was debrided using a [employer] flyer shaver blade.  The knee was copiously irrigated.  All synovial bleeding was cauterized with the werewolf wand.  Arthroscopic instruments were then removed. ? ?The patient had developed a small, less than 1 cm ulceration over the lateral right knee which had fibropurulent material in the bed of the wound.  This was debrided.  Samples of the fibropurulent material was sent for culture and pathology.  The ulceration wound was then copiously irrigated.  This wound communicated with the knee joint.  The ulceration was closed with a 3-0 Monocryl for deep closure and 4-0 nylon for skin closure.  The 2 anterior arthroscopy portals were closed with 4-0  nylon.  Patient was injected with quarter percent Marcaine with epinephrine x30 cc for postop pain control.  Steri-Strips, Xeroform and a dry sterile bandage was applied to the right knee.  Patient was awoken and brought to the PACU in stable condition.  I scrubbed and present for the entire case.  All sharp, sponge and instrument counts were correct at the conclusion of the case. ? ?I spoke with the patient's daughter, Bertis Ruddy, to let her know the case had been performed without complication and her mother was stable in the recovery room.  Patient will be admitted for further IV antibiotics.  Infectious disease consultation has been ordered and results of the intraoperative cultures will be followed closely. ?

## 2021-12-17 NOTE — Progress Notes (Signed)
Patient is awake to self, confused post-op. Discussed with anesthesia:  Dr. Amie Critchley  patient received '1mg'$  morphine @ 1600, in pre-op patient had verbalized "don't give me anymore of that." ?Nursing staff updated, family updated, will keep patient longer in pacu  ?

## 2021-12-17 NOTE — Transfer of Care (Signed)
Immediate Anesthesia Transfer of Care Note ? ?Patient: Lydia Elliott ? ?Procedure(s) Performed: INCISION AND DRAINAGE (Right: Knee) ? ?Patient Location: PACU ? ?Anesthesia Type:General ? ?Level of Consciousness: sedated ? ?Airway & Oxygen Therapy: Patient Spontanous Breathing and Patient connected to face mask oxygen ? ?Post-op Assessment: Report given to RN and Post -op Vital signs reviewed and stable ? ?Post vital signs: Reviewed and stable ? ?Last Vitals:  ?Vitals Value Taken Time  ?BP 188/87 12/17/21 1933  ?Temp    ?Pulse 98 12/17/21 1938  ?Resp 16 12/17/21 1938  ?SpO2 90 % 12/17/21 1938  ?Vitals shown include unvalidated device data. ? ?Last Pain:  ?Vitals:  ? 12/17/21 1646  ?TempSrc: Oral  ?PainSc:   ?   ? ?Patients Stated Pain Goal: 0 (12/17/21 1646) ? ?Complications: No notable events documented. ?

## 2021-12-17 NOTE — Plan of Care (Signed)
  Problem: Activity: Goal: Risk for activity intolerance will decrease Outcome: Not Progressing   Problem: Pain Managment: Goal: General experience of comfort will improve Outcome: Not Progressing   

## 2021-12-17 NOTE — Progress Notes (Signed)
Patient awake/alert x4, baseline. Vitals stable, afebrile, dressing c/d/I right knee with ice bag as ordered.  ?Reviewed procedure with patient, plan of care. ?Verbalizes understanding  ?

## 2021-12-17 NOTE — Anesthesia Preprocedure Evaluation (Signed)
Anesthesia Evaluation  ?Patient identified by MRN, date of birth, ID band ?Patient awake ? ? ? ?Reviewed: ?Allergy & Precautions, NPO status , Patient's Chart, lab work & pertinent test results ? ?History of Anesthesia Complications ?(+) PONV and history of anesthetic complications ? ?Airway ?Mallampati: III ? ?TM Distance: <3 FB ?Neck ROM: full ? ? ? Dental ? ?(+) Chipped ?  ?Pulmonary ?neg shortness of breath, former smoker,  ?  ?Pulmonary exam normal ? ? ? ? ? ? ? Cardiovascular ?Exercise Tolerance: Good ?hypertension, (-) anginaNormal cardiovascular exam ? ? ?  ?Neuro/Psych ?PSYCHIATRIC DISORDERS negative neurological ROS ?   ? GI/Hepatic ?Neg liver ROS, GERD  Controlled,  ?Endo/Other  ?negative endocrine ROS ? Renal/GU ?  ? ?  ?Musculoskeletal ? ? Abdominal ?  ?Peds ? Hematology ?  ?Anesthesia Other Findings ?Past Medical History: ?No date: Anemia ?No date: Anxiety ?    Comment:  a.) on BZO ?No date: Aortic atherosclerosis ([place]) ?No date: Arthritis ?    Comment:  hands, knees, toes ?10/15/2016: Bone island ?    Comment:  a.) RIGHT humeral head ?No date: CAD (coronary artery disease) ?    Comment:  a.) 2010 --> PCI with stent placement x 1 (type location ?             unknown) ?No date: Cataract ?No date: Depression ?No date: GERD (gastroesophageal reflux disease) ?No date: Hyperlipidemia ?No date: Hypertension ?No date: IBS (irritable bowel syndrome) ?No date: Insomnia ?    Comment:  a.) on zolpidem ?10/18/2016: Nodule of right lung ?No date: Psoriatic arthritis ([place]) ?No date: Takotsubo cardiomyopathy ?No date: Thalassemia ?No date: Vertigo ?    Comment:  none - 10 yrs ?No date: Wears dentures ?    Comment:  partial lower ? ?Past Surgical History: ?08/12/1965: APPENDECTOMY ?08/12/2005: CARPAL TUNNEL RELEASE ?08/13/2011: CATARACT EXTRACTION ?08/12/1998: CHOLECYSTECTOMY ?03/11/2014: COLONOSCOPY ?    Comment:  Dr.Wohl ?12/11/2015: COLONOSCOPY WITH PROPOFOL; N/A ?    Comment:   Procedure: COLONOSCOPY WITH PROPOFOL;  Surgeon: Evangeline Gula  ?             Allen Norris, MD;  Location: [place];  Service:  ?             Endoscopy;  Laterality: N/A;  DESCENDING COLON POLYP ? ?08/12/2008: CORONARY ANGIOPLASTY WITH STENT PLACEMENT ?02/21/2014: ESOPHAGOGASTRODUODENOSCOPY ?    Comment:  Dr.Wohl ?12/11/2015: ESOPHAGOGASTRODUODENOSCOPY (EGD) WITH PROPOFOL; N/A ?    Comment:  Procedure: ESOPHAGOGASTRODUODENOSCOPY (EGD) WITH  ?             PROPOFOL;  Surgeon: Lucilla Lame, MD;  Location: [place]  ?             [facility];  Service: Endoscopy;  Laterality: N/A; ?10/04/2021: KNEE ARTHROSCOPY; Right ?    Comment:  Procedure: ARTHROSCOPY KNEE;  Surgeon: Thornton Park, ?             MD;  Location: [facility];  Service: Orthopedics;   ?             Laterality: Right; ?10/30/2021: KNEE ARTHROSCOPY; Right ?    Comment:  Procedure: ARTHROSCOPY KNEE, I&D OF SUPERFICIAL ABSCESS; ?             Surgeon: Thornton Park, MD;  Location: [facility];   ?             Service: Orthopedics;  Laterality: Right; ?12/11/2015: POLYPECTOMY; N/A ?    Comment:  Procedure: POLYPECTOMY;  Surgeon: Lucilla Lame, MD;   ?  Location: [place];  Service: Endoscopy;   ?             Laterality: N/A; ?No date: TONSILLECTOMY ?08/13/1967: TONSILLECTOMY AND ADENOIDECTOMY ? ?BMI   ? Body Mass Index: 29.11 kg/m?  ?  ? ? Reproductive/Obstetrics ?negative OB ROS ? ?  ? ? ? ? ? ? ? ? ? ? ? ? ? ?  ?  ? ? ? ? ? ? ? ? ?Anesthesia Physical ?Anesthesia Plan ? ?ASA: 2 ? ?Anesthesia Plan: General LMA  ? ?Post-op Pain Management:   ? ?Induction: Intravenous ? ?PONV Risk Score and Plan: Dexamethasone, Ondansetron, Midazolam and Treatment may vary due to age or medical condition ? ?Airway Management Planned: LMA ? ?Additional Equipment:  ? ?Intra-op Plan:  ? ?Post-operative Plan: Extubation in OR ? ?Informed Consent: I have reviewed the patients History and Physical, chart, labs and discussed the procedure including the risks, benefits and  alternatives for the proposed anesthesia with the patient or authorized representative who has indicated his/her understanding and acceptance.  ? ? ? ?Dental Advisory Given ? ?Plan Discussed with: Anesthesiologist, CRNA and Surgeon ? ?Anesthesia Plan Comments: (Patient consented for risks of anesthesia including but not limited to:  ?- adverse reactions to medications ?- damage to eyes, teeth, lips or other oral mucosa ?- nerve damage due to positioning  ?- sore throat or hoarseness ?- Damage to heart, brain, nerves, lungs, other parts of body or loss of life ? ?Patient voiced understanding.)  ? ? ? ? ? ? ?Anesthesia Quick Evaluation ? ?

## 2021-12-18 ENCOUNTER — Encounter: Payer: Self-pay | Admitting: Orthopedic Surgery

## 2021-12-18 DIAGNOSIS — M009 Pyogenic arthritis, unspecified: Secondary | ICD-10-CM | POA: Diagnosis not present

## 2021-12-18 DIAGNOSIS — M00861 Arthritis due to other bacteria, right knee: Secondary | ICD-10-CM | POA: Diagnosis not present

## 2021-12-18 LAB — GLUCOSE, BODY FLUID OTHER: Glucose, Body Fluid Other: 2 mg/dL

## 2021-12-18 LAB — ACID FAST SMEAR (AFB, MYCOBACTERIA): Acid Fast Smear: NEGATIVE

## 2021-12-18 LAB — PROTEIN, BODY FLUID (OTHER): Total Protein, Body Fluid Other: 3.4 g/dL

## 2021-12-18 MED ORDER — VANCOMYCIN HCL 1250 MG/250ML IV SOLN
1250.0000 mg | INTRAVENOUS | Status: DC
  Administered 2021-12-19: 1250 mg via INTRAVENOUS
  Filled 2021-12-18: qty 250

## 2021-12-18 NOTE — Evaluation (Signed)
Physical Therapy Evaluation ?Patient Details ?Name: Lydia Elliott ?MRN: [id number] ?DOB: 1950/11/30 ?Today's Date: 12/18/2021 ? ?History of Present Illness ? Patient is a 71 year old female who presented to the [facility] ED on 12/16/21 due to R knee pain. Patient has a PMH (+) for hypertension, hyperlipidemia, GERD, depression with anxiety, anemia, thalassemia, Takotsubo cardiomyopathy, Psoriatec arthritis, IBS, CAD. Patient was here end of March for a R knee arthroscopy. Arthroscopic irrigation and debridement of right septic knee joint with open debridement of the lateral knee ulceration wound performed on 12/17/21. ?  ?Clinical Impression ? Physical Therapy Evaluation completed this date. Due to patient's RLE pain (9/10) participation was limited. Patient states she lives alone in a multi-level house, however lives on the main floor and never goes upstairs. There are 5 steps to enter with B hand rails. At baseline, patient was ambulating with a SPC, and was Mod I with all ADLs and mobility. Patient however was not driving, states she would have her friends drive her to appointments due to pain.  ? ?Patient's RLE ROM demonstrated decreased knee extension at rest. ROM and MMT were unable to be assessed due to significant pain. LLE strength was at least 3+/5. Patient required CGA/MIN A to complete supine to sit transfer to EOB due to R knee pain. Increased pain reported from patient with RLE hanging off the side of the bed. CGA from elevated surface was required for Sit to stand from EOB and BSC, however patient was able to complete step pivot transfer with RW and NWB on RLE to/from [facility]. Upon returning to EOB, patient was instructed on lateral weight-shifting in TTWB to work on [place] through the RLE. Once patient became comfortable with this, patient was able to ambulate from EOB>doorway>recliner at [facility] with RW. Initially patient ambulated with NWB however progressed to TTWB. Patient was left in recliner with all needs met and  in reach. Patient would continue to benefit from skilled physical therapy in order to optimize patient's return to PLOF. Recommend STR upon discharge from acute hospitalization. If STR declined recommend HHPT with intermittent supervision.  ?   ? ?Recommendations for follow up therapy are one component of a multi-disciplinary discharge planning process, led by the attending physician.  Recommendations may be updated based on patient status, additional functional criteria and insurance authorization. ? ?Follow Up Recommendations Skilled nursing-short term rehab (<3 hours/day) ? ?  ?Assistance Recommended at Discharge Intermittent Supervision/Assistance  ?Patient can return home with the following ? A little help with walking and/or transfers;A little help with bathing/dressing/bathroom;Assistance with cooking/housework;Assist for transportation;Help with stairs or ramp for entrance ? ?  ?Equipment Recommendations Other (comment) (TBD)  ?Recommendations for Other Services ?    ?  ?Functional Status Assessment Patient has had a recent decline in their functional status and demonstrates the ability to make significant improvements in function in a reasonable and predictable amount of time.  ? ?  ?Precautions / Restrictions Precautions ?Precautions: Fall ?Restrictions ?Weight Bearing Restrictions: No  ? ?  ? ?Mobility ? Bed Mobility ?Overal bed mobility: Needs Assistance ?Bed Mobility: Supine to Sit ?  ?  ?Supine to sit: Min guard, Min assist ?  ?  ?General bed mobility comments: due to pain patient required CGA/Min A at the RLE ?  ? ?Transfers ?Overall transfer level: Needs assistance ?Equipment used: Rolling walker (2 wheels) ?Transfers: Sit to/from Stand ?Sit to Stand: Min guard, From elevated surface ?  ?  ?  ?  ?  ?General transfer comment: cueing  on hand placement ?  ? ?Ambulation/Gait ?Ambulation/Gait assistance: Min guard ?Gait Distance (Feet): 40 Feet ?Assistive device: Rolling walker (2 wheels) ?Gait  Pattern/deviations: Step-to pattern, Antalgic ?Gait velocity: decreased ?  ?  ?General Gait Details: step to pattern due to pain, patient initially started with NWB on RLE, however progressed to TTWB when ambulating around the room ? ?Stairs ?  ?  ?  ?  ?  ? ?Wheelchair Mobility ?  ? ?Modified Rankin (Stroke Patients Only) ?  ? ?  ? ?Balance Overall balance assessment: Needs assistance ?Sitting-balance support: No upper extremity supported, Feet supported ?Sitting balance-Leahy Scale: Fair ?  ?  ?Standing balance support: Bilateral upper extremity supported, During functional activity, Reliant on assistive device for balance ?Standing balance-Leahy Scale: Fair ?Standing balance comment: CGA during functional ambulation ?  ?  ?  ?  ?  ?  ?  ?  ?  ?  ?  ?   ? ? ? ?Pertinent Vitals/Pain Pain Assessment ?Pain Assessment: 0-10 ?Pain Score: 9  ?Pain Location: R knee ?Pain Descriptors / Indicators: Dull, Aching, Sharp, Discomfort ?Pain Intervention(s): Monitored during session, Limited activity within patient's tolerance, Repositioned  ? ? ?Home Living Family/patient expects to be discharged to:: Private residence ?Living Arrangements: Alone ?Available Help at Discharge: Friend(s);Available PRN/intermittently;Family (patient has not been driving, gets rides from her friends) ?Type of Home: House ?Home Access: Stairs to enter ?Entrance Stairs-Rails: Right;Left ?Entrance Stairs-Number of Steps: 5 ?  ?Home Layout: Two level;Able to live on main level with bedroom/bathroom ?Home Equipment: Conservation officer, nature (2 wheels);Cane - single point;Grab bars - tub/shower ?   ?  ?Prior Function Prior Level of Function : Independent/Modified Independent ?  ?  ?  ?  ?  ?  ?Mobility Comments: Mod I with SPC ?ADLs Comments: Mod I ?  ? ? ?Hand Dominance  ? Dominant Hand: Right ? ?  ?Extremity/Trunk Assessment  ? Upper Extremity Assessment ?Upper Extremity Assessment: Overall WFL for tasks assessed ?  ? ?Lower Extremity Assessment ?Lower Extremity  Assessment: RLE deficits/detail;Generalized weakness;LLE deficits/detail ?RLE: Unable to fully assess due to pain (unable to place weight on RLE due to increased pain) ?LLE Deficits / Details: At least 3+/5 ?  ? ?   ?Communication  ? Communication: No difficulties  ?Cognition Arousal/Alertness: Awake/alert ?Behavior During Therapy: [facility] for tasks assessed/performed ?Overall Cognitive Status: Within Functional Limits for tasks assessed ?  ?  ?  ?  ?  ?  ?  ?  ?  ?  ?  ?  ?  ?  ?  ?  ?General Comments: A&Ox3 self location and situation ?  ?  ? ?  ?General Comments   ? ?  ?Exercises Other Exercises ?Other Exercises: patient educated on role of PT in acute care setting, fall risk, and d/c recommendations  ? ?Assessment/Plan  ?  ?PT Assessment Patient needs continued PT services  ?PT Problem List Decreased strength;Decreased mobility;Decreased range of motion;Decreased balance;Decreased activity tolerance;Pain ? ?   ?  ?PT Treatment Interventions Therapeutic activities;Therapeutic exercise;Gait training;Balance training;Stair training   ? ?PT Goals (Current goals can be found in the Care Plan section)  ?Acute Rehab PT Goals ?Patient Stated Goal: to not be in pain ?PT Goal Formulation: With patient ?Time For Goal Achievement: 01/01/22 ?Potential to Achieve Goals: Fair ? ?  ?Frequency 7X/week ?  ? ? ?Co-evaluation   ?  ?  ?  ?  ? ? ?  ?AM-PAC PT "6 Clicks" Mobility  ?Outcome Measure Help  needed turning from your back to your side while in a flat bed without using bedrails?: A Little ?Help needed moving from lying on your back to sitting on the side of a flat bed without using bedrails?: A Little ?Help needed moving to and from a bed to a chair (including a wheelchair)?: A Little ?Help needed standing up from a chair using your arms (e.g., wheelchair or bedside chair)?: A Little ?Help needed to walk in hospital room?: A Little ?Help needed climbing 3-5 steps with a railing? : A Lot ?6 Click Score: 17 ? ?  ?End of Session  Equipment Utilized During Treatment: Gait belt ?Activity Tolerance: Patient limited by pain ?Patient left: in chair;with call bell/phone within reach;with chair alarm set ?Nurse Communication: Mobility status ?PT

## 2021-12-18 NOTE — Progress Notes (Signed)
? ?Date of Admission:  12/16/2021   T ? ?ID: Lydia Elliott is a 71 y.o. female  ?Principal Problem: ?  Septic joint of right knee joint ([place]) ?Active Problems: ?  Hypertension ?  Hyperlipidemia ?  CAD (coronary artery disease) ?  Thalassemia ?  Depression with anxiety ? ?Friends at bedside ? ?Subjective: ?Patient states she could not participate with PT today because of pain in her knee ?No fever ? ? ?Medications:  ? aspirin EC  325 mg Oral Daily  ? atorvastatin  20 mg Oral Daily  ? busPIRone  5 mg Oral Daily  ? carvedilol  3.125 mg Oral BID WC  ? enoxaparin (LOVENOX) injection  40 mg Subcutaneous Q24H  ? escitalopram  20 mg Oral Daily  ? losartan  25 mg Oral Daily  ? pantoprazole  40 mg Oral Daily  ? ? ?Objective: ?Vital signs in last 24 hours: ?Temp:  [97.5 ?F (36.4 ?C)-99.2 ?F (37.3 ?C)] 98.7 ?F (37.1 ?C) (05/09 1144) ?Pulse Rate:  [79-98] 88 (05/09 1144) ?Resp:  [16-20] 18 (05/09 1144) ?BP: (115-190)/(46-77) 115/46 (05/09 1144) ?SpO2:  [92 %-100 %] 94 % (05/09 1144) ?Weight:  [67.6 kg] 67.6 kg (05/08 1646) ? ?LDA ?None ?PHYSICAL EXAM:  ?General: Alert, cooperative, no distress, appears stated age.  ?Head: Normocephalic, without obvious abnormality, atraumatic. ?Eyes: Conjunctivae clear, anicteric sclerae. Pupils are equal ?ENT Nares normal. No drainage or sinus tenderness. ?Lips, mucosa, and tongue normal. No Thrush ?Neck: Supple, symmetrical, no adenopathy, thyroid: non tender ?no carotid bruit and no JVD. ?Back: No CVA tenderness. ?Lungs: Clear to auscultation bilaterally. No Wheezing or Rhonchi. No rales. ?Heart: Regular rate and rhythm, no murmur, rub or gallop. ?Abdomen: Soft, non-tender,not distended. Bowel sounds normal. No masses ?Extremities: Right knee dressing not removed ?Skin: No rashes or lesions. Or bruising ?Lymph: Cervical, supraclavicular normal. ?Neurologic: Grossly non-focal ? ?Lab Results ?Recent Labs  ?  12/16/21 ?0913 12/17/21 ?0865 12/17/21 ?7846  ?WBC 16.4* 7.5  --   ?HGB 10.6* 8.8*  --    ?HCT 36.1 29.6*  --   ?NA 139  --   --   ?K 3.7  --   --   ?CL 105  --   --   ?CO2 24  --   --   ?BUN 17  --   --   ?CREATININE 0.72  --  0.65  ? ?Liver Panel ?Recent Labs  ?  12/16/21 ?0913  ?PROT 6.9  ?ALBUMIN 3.8  ?AST 17  ?ALT 14  ?ALKPHOS 75  ?BILITOT 1.4*  ? ?Sedimentation Rate ?Recent Labs  ?  12/16/21 ?0913  ?ESRSEDRATE 9  ? ?C-Reactive Protein ?Recent Labs  ?  12/16/21 ?0913  ?CRP 0.5  ? ? ?Microbiology: ?Synovial tissue right knee cultures pending ?Synovial fluid right knee cultures so far negative ? ? ? ?Assessment/Plan: ?71 year old female with history of psoriatic arthritis on Humira and prednisone with recurrent intra-articular steroid injection to the right knee has had right knee pain for many months.  Underwent arthroscopy in February followed by a subcutaneous abscess at the site of the arthroscopy which was Staph epidermidis and was treated with ciprofloxacin for 10 days.  Now presents with acute onset of pain and severe swelling.  Has had a wound at the site of the medial tibial area. ? ?Right knee septic arthritis to be ruled out in a patient with immune compromised history.underwent Washout ?Patient is currently on vancomycin and cefepime.  Cultures were sent from synovial fluid and synovial tissue yesterday ?  she is going to need IV antibiotics on discharge for at least 4 weeks. ? 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 ? ?Thalassemia ? ?Anxiety/depression on Lexapro, BuSpar, Ambien, Xanax as needed ?CAD status post stent.  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 ?On atorvastatin, Not taking aspirin anymore. ? ?Hypertension on losartan and carvedilol. ? ?Discussed the management with the patient at bedside. ?

## 2021-12-18 NOTE — Consult Note (Signed)
Pharmacy Antibiotic Note ? ?Lydia Elliott is a 71 y.o. female admitted on 12/16/2021 with  septic joint .  Pharmacy has been consulted for Cefepime and Vancomycin dosing. She had I&D by Orthopedics on 5/8 ? ?Today, 12/18/2021 ?Day #3 antibiotics ?WBC improved to WNL ?Afebrile ?5/7  Synovial fluid (aspiration): NGTD ?FLuid: 57K WBC, 79% neuts ?5/8 synovial fluid (I&D): NGTD ? ?Plan: ?Continue Cefepime 2gm q 12 hours ?Adjust vancomycin '1250mg'$  IV q24h ?Goal AUC 400-600. ?Expected AUC: 540 ?Calculated Css max 36.6 ?Calculated Css min 11.8 ?SCr used: 0.8 but used Adj BW for CrCL/Ke equation so that CrCl is what would expect for age and normal SCr.  ? ? ?Height: 5' (152.4 cm) ?Weight: 67.6 kg (149 lb 0.5 oz) ?IBW/kg (Calculated) : 45.5 ? ?Temp (24hrs), Avg:98.4 ?F (36.9 ?C), Min:97.5 ?F (36.4 ?C), Max:99.2 ?F (37.3 ?C) ? ?Recent Labs  ?Lab 12/16/21 ?0913 12/16/21 ?1536 12/17/21 ?6803 12/17/21 ?2122  ?WBC 16.4*  --  7.5  --   ?CREATININE 0.72  --   --  0.65  ?LATICACIDVEN  --  1.4  --   --   ? ?  ?Estimated Creatinine Clearance: 55.3 mL/min (by C-G formula based on SCr of 0.65 mg/dL).   ? ?Allergies  ?Allergen Reactions  ? Rosuvastatin   ?  Muscle Pain  ? ? ?Antimicrobials this admission: ?5/7 Vancomycin >>  ?5/7 Cefepime >>  ? ?Dose adjustments this admission: none ? ?Microbiology results: ?5/7 BCx: NGTD ?5/7 MRSA PCR: neg ?5/7  Synovial fluid (aspiration): NGTD ?FLuid: 57K WBC, 79% neuts ?5/8 synovial fluid (I&D): NGTD ? ?Thank you for allowing pharmacy to be a part of this patient?s care. ? ?Doreene Eland, PharmD, BCPS, BCIDP ?Work Cell: [phone] ?12/18/2021 12:12 PM ? ? ? ?

## 2021-12-18 NOTE — TOC Progression Note (Signed)
Transition of Care (TOC) - Progression Note  ? ? ?Patient Details  ?Name: Lydia Elliott ?MRN: [id number] ?Date of Birth: 15-Jan-1951 ? ?Transition of Care (TOC) CM/SW Contact  ?Conception Oms, RN ?Phone Number: ?12/18/2021, 2:55 PM ? ?Clinical Narrative:    ? ?Recommendation is STR, SNF, Patient agreeable to a bedsearch, [place] sent, FL2 completed, PASSR obtained, will review offers once received ? ?  ?  ? ?Expected Discharge Plan and Services ?  ?  ?  ?  ?  ?                ?  ?  ?  ?  ?  ?  ?  ?  ?  ?  ? ? ?Social Determinants of Health (SDOH) Interventions ?  ? ?Readmission Risk Interventions ?   ? View : No data to display.  ?  ?  ?  ? ? ?

## 2021-12-18 NOTE — NC FL2 (Signed)
?[place] MEDICAID FL2 LEVEL OF CARE SCREENING TOOL  ?  ? ?IDENTIFICATION  ?Patient Name: ?Lydia Elliott Birthdate: 1951-07-09 Sex: female Admission Date (Current Location): ?12/16/2021  ?[place] and [place] Number: ? [place] ?  Facility and Address:  ?[facility], [address] [place], [place] [address] ?     Provider Number: ?[id number]  ?Attending Physician Name and Address:  ?Fritzi Mandes, MD ? Relative Name and Phone Number:  ?Izora Gala, [place] ?   ?Current Level of Care: ?Hospital Recommended Level of Care: ?[place] Prior Approval Number: ?  ? ?Date Approved/Denied: ?  PASRR Number: ?[id number] ? ?Discharge Plan: ?SNF ?  ? ?Current Diagnoses: ?Patient Active Problem List  ? Diagnosis Date Noted  ? Septic joint of right knee joint ([place]) 12/16/2021  ? Depression with anxiety 12/16/2021  ? Aortic atherosclerosis ([place]) 10/18/2016  ? Nodule of right lung 10/18/2016  ? GERD (gastroesophageal reflux disease) 04/24/2016  ? Anxiety about health 12/23/2015  ? Overweight (BMI 25.0-29.9) 12/23/2015  ? Benign neoplasm of descending colon   ? Impaired fasting glucose 07/03/2015  ? Anemia 07/03/2015  ? Takotsubo cardiomyopathy 06/18/2015  ? Hypertension   ? Hyperlipidemia   ? Insomnia   ? Polyarthralgia   ? CAD (coronary artery disease)   ? Thalassemia   ? Heart disease   ? ? ?Orientation RESPIRATION BLADDER Height & Weight   ?  ?Self, Time, Situation, Place ? Normal Continent Weight: 67.6 kg ?Height:  5' (152.4 cm)  ?BEHAVIORAL SYMPTOMS/MOOD NEUROLOGICAL BOWEL NUTRITION STATUS  ?    Continent Diet  ?AMBULATORY STATUS COMMUNICATION OF NEEDS Skin   ?Extensive Assist Verbally Normal, Surgical wounds ?  ?  ?  ?    ?     ?     ? ? ?Personal Care Assistance Level of Assistance  ?Bathing, Feeding, Dressing Bathing Assistance: Maximum assistance ?Feeding assistance: Limited assistance ?Dressing Assistance: Maximum assistance ?   ? ?Functional Limitations Info  ?    ?  ?    ? ? ?SPECIAL CARE FACTORS FREQUENCY  ?PT (By licensed PT), OT (By licensed OT)   ?  ?PT Frequency: 5 time per week ?OT Frequency: 5 times per week ?  ?  ?  ?   ? ? ?Contractures Contractures Info: Not present  ? ? ?Additional Factors Info  ?Code Status, Allergies Code Status Info: FUll code ?Allergies Info: Rosuvastatin ?  ?  ?  ?   ? ?Current Medications (12/18/2021):  This is the current hospital active medication list ?Current Facility-Administered Medications  ?Medication Dose Route Frequency Provider Last Rate Last Admin  ? 0.9 %  sodium chloride infusion   Intravenous Continuous Thornton Park, MD 75 mL/hr at 12/18/21 1428 New Bag at 12/18/21 1428  ? acetaminophen (TYLENOL) tablet 650 mg  650 mg Oral Q6H PRN Thornton Park, MD   650 mg at 12/16/21 2140  ? ALPRAZolam Duanne Moron) tablet 0.25 mg  0.25 mg Oral BID PRN Thornton Park, MD      ? aspirin EC tablet 325 mg  325 mg Oral Daily Thornton Park, MD   325 mg at 12/17/21 0847  ? atorvastatin (LIPITOR) tablet 20 mg  20 mg Oral Daily Thornton Park, MD   20 mg at 12/17/21 2213  ? busPIRone (BUSPAR) tablet 5 mg  5 mg Oral Daily Thornton Park, MD   5 mg at 12/18/21 1100  ? carvedilol (COREG) tablet 3.125 mg  3.125 mg Oral BID WC Thornton Park, MD  3.125 mg at 12/18/21 0900  ? ceFEPIme (MAXIPIME) 2 g in sodium chloride 0.9 % 100 mL IVPB  2 g Intravenous Q12H Thornton Park, MD 200 mL/hr at 12/18/21 1303 2 g at 12/18/21 1303  ? docusate sodium (COLACE) capsule 100 mg  100 mg Oral Daily PRN Thornton Park, MD      ? enoxaparin (LOVENOX) injection 40 mg  40 mg Subcutaneous Q24H Thornton Park, MD      ? escitalopram (LEXAPRO) tablet 20 mg  20 mg Oral Daily Thornton Park, MD   20 mg at 12/18/21 1058  ? hydrALAZINE (APRESOLINE) injection 5 mg  5 mg Intravenous Q2H PRN Thornton Park, MD      ? HYDROcodone-acetaminophen (NORCO/VICODIN) 5-325 MG per tablet 1-2 tablet  1-2 tablet Oral Q4H PRN Thornton Park, MD   1 tablet at 12/18/21 1100  ?  losartan (COZAAR) tablet 25 mg  25 mg Oral Daily Thornton Park, MD   25 mg at 12/17/21 2213  ? morphine (PF) 2 MG/ML injection 1-2 mg  1-2 mg Intravenous Q2H PRN Thornton Park, MD      ? ondansetron [facility]) injection 4 mg  4 mg Intravenous Q8H PRN Thornton Park, MD      ? pantoprazole (PROTONIX) EC tablet 40 mg  40 mg Oral Daily Thornton Park, MD   40 mg at 12/18/21 1058  ? [START ON 12/19/2021] vancomycin (VANCOREADY) IVPB 1250 mg/250 mL  1,250 mg Intravenous Q24H Zeigler, Sandi Mealy, RPH      ? zolpidem (AMBIEN) tablet 5 mg  5 mg Oral QHS PRN Thornton Park, MD   5 mg at 12/16/21 2140  ? ? ? ?Discharge Medications: ?Please see discharge summary for a list of discharge medications. ? ?Relevant Imaging Results: ? ?Relevant Lab Results: ? ? ?Additional Information ?SS# [id number] ? ?Conception Oms, RN ? ? ? ? ?

## 2021-12-18 NOTE — Anesthesia Postprocedure Evaluation (Signed)
Anesthesia Post Note ? ?Patient: Lydia Elliott ? ?Procedure(s) Performed: INCISION AND DRAINAGE (Right: Knee) ? ?Patient location during evaluation: PACU ?Anesthesia Type: General ?Level of consciousness: awake and alert ?Pain management: pain level controlled ?Vital Signs Assessment: post-procedure vital signs reviewed and stable ?Respiratory status: spontaneous breathing, nonlabored ventilation, respiratory function stable and patient connected to nasal cannula oxygen ?Cardiovascular status: blood pressure returned to baseline and stable ?Postop Assessment: no apparent nausea or vomiting ?Anesthetic complications: no ? ? ?No notable events documented. ? ? ?Last Vitals:  ?Vitals:  ? 12/17/21 2045 12/17/21 2100  ?BP: (!) 119/53 (!) 131/52  ?Pulse: 87 85  ?Resp: 16 16  ?Temp:  36.4 ?C  ?SpO2: 93% 95%  ?  ?Last Pain:  ?Vitals:  ? 12/17/21 2100  ?TempSrc:   ?PainSc: 0-No pain  ? ? ?  ?  ?  ?  ?  ?  ? ?Lydia Elliott Lydia Elliott ? ? ? ? ?

## 2021-12-18 NOTE — Progress Notes (Signed)
?  Subjective: ? ?POD #1 s/p arthroscopic I&D of the right knee with debridement of lateral ulcer.   Patient reports right knee pain as mild to moderate.  Patient states earlier today her knee pain was significant.  Patient is currently receiving IV vancomycin and cefepime while cultures are pending.  There is no growth on cultures from the OR so far.  Tissue samples sent to pathology are pending.  Patient is afebrile. ? ?Objective:  ? ?VITALS:   ?Vitals:  ? 12/17/21 2100 12/18/21 0923 12/18/21 1144 12/18/21 1609  ?BP: (!) 131/52 (!) 145/47 (!) 115/46 (!) 107/31  ?Pulse: 85 93 88 85  ?Resp: 16  18   ?Temp: 97.6 ?F (36.4 ?C) 98.6 ?F (37 ?C) 98.7 ?F (37.1 ?C) 98.8 ?F (37.1 ?C)  ?TempSrc:  Oral    ?SpO2: 95% 92% 94% 98%  ?Weight:      ?Height:      ? ? ?PHYSICAL EXAM: ?Right lower extremity ?Neurovascular intact ?Sensation intact distally ?Intact pulses distally ?Dorsiflexion/Plantar flexion intact ?Incision: dressing C/D/I ?No cellulitis present ?Compartment soft ? ?LABS ? ?Results for orders placed or performed during the hospital encounter of 12/16/21 (from the past 24 hour(s))  ?Aerobic/Anaerobic Culture w Gram Stain (surgical/deep wound)     Status: None (Preliminary result)  ? Collection Time: 12/17/21  6:04 PM  ? Specimen: PATH Other; Body Fluid  ?Result Value Ref Range  ? Specimen Description FLUID   ? Special Requests RIGHT KNEE   ? Gram Stain    ?  NO SQUAMOUS EPITHELIAL CELLS SEEN ?FEW WBC SEEN ?NO ORGANISMS SEEN ?  ? Culture    ?  NO GROWTH < 12 HOURS ?Performed at [facility], [place] [address] [place], [place] [address] ?  ? Report Status PENDING   ?Aerobic/Anaerobic Culture w Gram Stain (surgical/deep wound)     Status: None (Preliminary result)  ? Collection Time: 12/17/21  6:15 PM  ? Specimen: Tissue  ?Result Value Ref Range  ? Specimen Description TISSUE   ? Special Requests RIGHT KNEE SYNOVIAL TIS SPEC B   ? Gram Stain    ?  NO SQUAMOUS EPITHELIAL CELLS SEEN ?FEW WBC SEEN ?NO ORGANISMS SEEN ?  ?  Culture    ?  NO GROWTH < 12 HOURS ?Performed at [facility], [place] [address], [place], [place] [address] ?  ? Report Status PENDING   ? ? ?No results found. ? ?Assessment/Plan: ?1 Day Post-Op  ? ?Principal Problem: ?  Septic joint of right knee joint ([place]) ?Active Problems: ?  Hypertension ?  Hyperlipidemia ?  CAD (coronary artery disease) ?  Thalassemia ?  Depression with anxiety ? ?Patient stable postop.  Recheck labs in the a.m.  Patient seen by physical therapy earlier and has been recommended for skilled nursing facility.  Continue IV antibiotics per infectious disease until cultures are available.  Patient will likely need a PICC line for IV antibiotics upon discharge. ? ? ?Thornton Park , MD ?12/18/2021, 5:03 PM ? ? ? ? ?  ?

## 2021-12-18 NOTE — Progress Notes (Signed)
[place] at [facility] ? ? ?PATIENT NAME: Lydia Elliott   ? ?MR#:  [id number] ? ?DATE OF BIRTH:  August 04, 1951 ? ?SUBJECTIVE:  ? ?patient came in with right knee swelling since Saturday night denies any recent injury. She had previous right knee arthroscopy for partial medial meniscectomy in February 9371 complicated by subcutaneous abscess treated with arthroscopic wash out with IND and completed a course of antibiotic. ? ?No fever. No family at bedside. Patient has history of psoriatic arthritis and was started on humira and prednisone by outpatient rheumatology ? ? ?VITALS:  ?Blood pressure (!) 115/46, pulse 88, temperature 98.7 ?F (37.1 ?C), resp. rate 18, height 5' (1.524 m), weight 67.6 kg, SpO2 94 %. ? ?PHYSICAL EXAMINATION:  ? ?GENERAL:  71 y.o.-year-old patient lying in the bed with no acute distress.  ?LUNGS: Normal breath sounds bilaterally, no wheezing, rales, rhonchi.  ?CARDIOVASCULAR: S1, S2 normal. No murmurs, rubs, or gallops.  ?ABDOMEN: Soft, nontender, nondistended. Bowel sounds present.  ?EXTREMITIES: right knee swelling ? ?NEUROLOGIC: nonfocal  patient is alert and awake ?SKIN: as above ? ?LABORATORY PANEL:  ?CBC ?Recent Labs  ?Lab 12/17/21 ?6967  ?WBC 7.5  ?HGB 8.8*  ?HCT 29.6*  ?PLT 206  ? ? ? ?Chemistries  ?Recent Labs  ?Lab 12/16/21 ?0913 12/17/21 ?8938  ?NA 139  --   ?K 3.7  --   ?CL 105  --   ?CO2 24  --   ?GLUCOSE 113*  --   ?BUN 17  --   ?CREATININE 0.72 0.65  ?CALCIUM 8.9  --   ?AST 17  --   ?ALT 14  --   ?ALKPHOS 75  --   ?BILITOT 1.4*  --   ? ? ?Cardiac Enzymes ?No results for input(s): TROPONINI in the last 168 hours. ?RADIOLOGY:  ?No results found. ? ?Assessment and Plan ? [name] is a 71 y.o. female with medical history significant of hypertension, hyperlipidemia, GERD, depression with anxiety, anemia, thalassemia, Takotsubo cardiomyopathy, Psoriatec arthritis, IBS, CAD, who presents with right knee pain. ?  ?She has a complex history of recent  arthroscopy of her right knee, complicated by infection. She does not have any hardware in her knee. Her last procedure was in March, when she had an arthroscopy that showed no joint infection or inflammation, and she has been off antibiotics since then. ? ?Right knee swelling and fluid aspiration suggestive septic right knee joint ?history of right knee arthroscopy and repair for partial meniscal tear February 2023 ?history of right knee infection with arthroscopic washout and incision drainage of abscess March 2023 ?-- continue IV vancomycin and cefepime ?-- synovial fluid white count more than 57,000 predominantly neutrophil. ?-- Orthopedic consultation with Dr. Mack Guise-- patient is s/p right knee arthroscopic washout 12/17/2021 ?-- blood culture negative ?-- MR SA PCR negative ?-- ID consultation with Dr. Steva Ready-- will need IV antibiotics for four weeks. Will get PICC line prior to d/c ?-- PRN pain meds ?--WBC 16.4--7.5 ?--Synovial fluid Culture-- NGTD ?--BC negative ?--PT recommends rehab ? ?Depression with anxiety ?-- Continue home medications ?  ?Thalassemia ?--Thalassemia and microcytic anemia: Hemoglobin stable 10.6 (9.6 on 10/30/2021) ?  ?CAD (coronary artery disease) ?- Aspirin, Lipitor ?  ?Hyperlipidemia ?- Lipitor ?  ?Hypertension ?- IV hydralazine as needed ?-Coreg, Cozaar ? ? ?Family communication :none today ?Consults : orthopedic, ID ?CODE STATUS: full ?DVT Prophylaxis : Lovenox after procedure ?Level of care: Med-Surg ?Status is: Inpatient ?Remains inpatient appropriate because: right knee septic arthritis. Patient  needs knee wash out and IV antibiotics. ? ?TOC for d/c to rehab ?  ? ?TOTAL TIME TAKING CARE OF THIS PATIENT: 35 minutes.  ?>50% time spent on counselling and coordination of care ? ?Note: This dictation was prepared with Dragon dictation along with smaller phrase technology. Any transcriptional errors that result from this process are unintentional. ? ?Lydia Elliott M.D  ? ? ?[facility]  ? ?CC: ?Primary care physician; Leonel Ramsay, MD  ?

## 2021-12-18 NOTE — Plan of Care (Signed)
  Problem: Education: Goal: Knowledge of General Education information will improve Description Including pain rating scale, medication(s)/side effects and non-pharmacologic comfort measures Outcome: Progressing   Problem: Health Behavior/Discharge Planning: Goal: Ability to manage health-related needs will improve Outcome: Progressing   

## 2021-12-19 ENCOUNTER — Inpatient Hospital Stay: Payer: Self-pay

## 2021-12-19 DIAGNOSIS — M009 Pyogenic arthritis, unspecified: Secondary | ICD-10-CM | POA: Diagnosis not present

## 2021-12-19 LAB — BODY FLUID CULTURE W GRAM STAIN
Culture: NO GROWTH
Gram Stain: NONE SEEN

## 2021-12-19 LAB — CK: Total CK: 54 U/L (ref 38–234)

## 2021-12-19 LAB — ACID FAST SMEAR (AFB, MYCOBACTERIA)
Acid Fast Smear: NEGATIVE
Acid Fast Smear: NEGATIVE

## 2021-12-19 LAB — GLUCOSE, CAPILLARY: Glucose-Capillary: 102 mg/dL — ABNORMAL HIGH (ref 70–99)

## 2021-12-19 LAB — CREATININE, SERUM
Creatinine, Ser: 0.66 mg/dL (ref 0.44–1.00)
GFR, Estimated: >60 mL/min (ref >60)

## 2021-12-19 MED ORDER — CHLORHEXIDINE GLUCONATE CLOTH 2 % EX PADS: 6.0000 | MEDICATED_PAD | Freq: Every day | CUTANEOUS | Status: DC

## 2021-12-19 MED ORDER — SODIUM CHLORIDE 0.9% FLUSH
10.0000 mL | Freq: Two times a day (BID) | INTRAVENOUS | Status: DC
  Administered 2021-12-19 – 2021-12-20 (×2): 10 mL

## 2021-12-19 MED ORDER — SODIUM CHLORIDE 0.9 % IV SOLN
2.0000 g | INTRAVENOUS | Status: DC
  Administered 2021-12-20: 2 g via INTRAVENOUS
  Filled 2021-12-19: qty 20

## 2021-12-19 MED ORDER — SODIUM CHLORIDE 0.9% FLUSH: 10.0000 mL | INTRAVENOUS | Status: DC | PRN

## 2021-12-19 MED ORDER — CYANOCOBALAMIN 1000 MCG/ML IJ SOLN
1000.0000 ug | Freq: Once | INTRAMUSCULAR | Status: AC
  Administered 2021-12-19: 1000 ug via INTRAMUSCULAR
  Filled 2021-12-19: qty 1

## 2021-12-19 NOTE — Progress Notes (Signed)
Physical Therapy Treatment ?Patient Details ?Name: Lydia Elliott ?MRN: [id number] ?DOB: April 09, 1951 ?Today's Date: 12/19/2021 ? ? ?History of Present Illness Patient is a 71 year old female who presented to the [facility] ED on 12/16/21 due to R knee pain. Patient has a PMH (+) for hypertension, hyperlipidemia, GERD, depression with anxiety, anemia, thalassemia, Takotsubo cardiomyopathy, Psoriatec arthritis, IBS, CAD. Patient was here end of March for a R knee arthroscopy. Arthroscopic irrigation and debridement of right septic knee joint with open debridement of the lateral knee ulceration wound performed on 12/17/21. ? ?  ?PT Comments  ? ? Physical Therapy session completed this date. Patient tolerated session much better compared to yesterday due to decreased pain. Patient was highly motivated to participate in therapy services. Patient started and ended session in recliner, completing sit to stands at SBA/SUP with RW. Patient was able to tolerated increased weightbearing on the RLE this session at [facility] with ~<50%. Increased ambulation distance noted this session, ambulating ~250 with RW at SBA/SUP, and ascended and descended 4 steps with step to pattern and bilateral hand rails at [facility]. Patient would continue to benefit from skilled physical therapy in order to optimize patient's return to PLOF. Updating discharge recommendations to outpatient PT.  ?    ?Recommendations for follow up therapy are one component of a multi-disciplinary discharge planning process, led by the attending physician.  Recommendations may be updated based on patient status, additional functional criteria and insurance authorization. ? ?Follow Up Recommendations ? Outpatient PT ?  ?  ?Assistance Recommended at Discharge Intermittent Supervision/Assistance  ?Patient can return home with the following A little help with walking and/or transfers;A little help with bathing/dressing/bathroom;Assistance with cooking/housework;Assist for transportation;Help  with stairs or ramp for entrance ?  ?Equipment Recommendations ? None recommended by PT (Patient reports she has all required DME at home (2 RWs and a [facility]))  ?  ?Recommendations for Other Services   ? ? ?  ?Precautions / Restrictions Precautions ?Precautions: Fall ?Restrictions ?Weight Bearing Restrictions: No  ?  ? ?Mobility ? Bed Mobility ?  ?  ?  ?  ?  ?  ?  ?General bed mobility comments: patient started and ended session in recliner ?Patient Response: Cooperative ? ?Transfers ?Overall transfer level: Needs assistance ?Equipment used: Rolling walker (2 wheels) ?Transfers: Sit to/from Stand ?Sit to Stand: Supervision ?  ?  ?  ?  ?  ?General transfer comment: good recall on hand placement ?  ? ?Ambulation/Gait ?Ambulation/Gait assistance: Supervision (SBA) ?Gait Distance (Feet): 250 Feet ?Assistive device: Rolling walker (2 wheels) ?Gait Pattern/deviations: Step-to pattern, Decreased step length - right, Decreased step length - left, Decreased stride length, Antalgic ?Gait velocity: decreased ?  ?  ?General Gait Details: due to less pain, patient was able to tolerate a slight step through gait pattern with a WBAT <50% on the RLE. No LOB noted ? ? ?Stairs ?Stairs: Yes ?Stairs assistance: Min guard ?Stair Management: Two rails, Step to pattern ?Number of Stairs: 4 ?General stair comments: patient educated on proper stair mechanics ? ? ?Wheelchair Mobility ?  ? ?Modified Rankin (Stroke Patients Only) ?  ? ? ?  ?Balance Overall balance assessment: Needs assistance ?Sitting-balance support: No upper extremity supported, Feet supported ?Sitting balance-Leahy Scale: Normal ?  ?  ?Standing balance support: Bilateral upper extremity supported, During functional activity, Reliant on assistive device for balance ?Standing balance-Leahy Scale: Fair ?Standing balance comment: SBA/SUP with functional ambulation ?  ?  ?  ?  ?  ?  ?  ?  ?  ?  ?  ?  ? ?  ?  Cognition Arousal/Alertness: Awake/alert ?Behavior During Therapy: [facility] for  tasks assessed/performed ?Overall Cognitive Status: Within Functional Limits for tasks assessed ?  ?  ?  ?  ?  ?  ?  ?  ?  ?  ?  ?  ?  ?  ?  ?  ?General Comments: Patient was more motivated and in much less pain this session ?  ?  ? ?  ?Exercises   ? ?  ?General Comments   ?  ?  ? ?Pertinent Vitals/Pain Pain Assessment ?Pain Score: 5  ?Pain Location: R knee ?Pain Descriptors / Indicators: Dull, Aching, Discomfort ?Pain Intervention(s): Monitored during session, Limited activity within patient's tolerance, Repositioned  ? ? ?Home Living   ?  ?  ?  ?  ?  ?  ?  ?  ?  ?   ?  ?Prior Function    ?  ?  ?   ? ?PT Goals (current goals can now be found in the care plan section) Acute Rehab PT Goals ?Patient Stated Goal: to not be in pain ?PT Goal Formulation: With patient ?Time For Goal Achievement: 01/01/22 ?Potential to Achieve Goals: Fair ?Additional Goals ?Additional Goal #1: Patient will be able to complete all bed mobility at Mod I ?Progress towards PT goals: Progressing toward goals ? ?  ?Frequency ? ? ? 7X/week ? ? ? ?  ?PT Plan Discharge plan needs to be updated  ? ? ?Co-evaluation   ?  ?  ?  ?  ? ?  ?AM-PAC PT "6 Clicks" Mobility   ?Outcome Measure ? Help needed turning from your back to your side while in a flat bed without using bedrails?: A Little ?Help needed moving from lying on your back to sitting on the side of a flat bed without using bedrails?: A Little ?Help needed moving to and from a bed to a chair (including a wheelchair)?: A Little ?Help needed standing up from a chair using your arms (e.g., wheelchair or bedside chair)?: A Little ?Help needed to walk in hospital room?: A Little ?Help needed climbing 3-5 steps with a railing? : A Little ?6 Click Score: 18 ? ?  ?End of Session Equipment Utilized During Treatment: Gait belt ?Activity Tolerance: Patient tolerated treatment well ?Patient left: in chair;with call bell/phone within reach ?Nurse Communication: Mobility status ?PT Visit Diagnosis:  Unsteadiness on feet (R26.81);Muscle weakness (generalized) (M62.81);Pain;Difficulty in walking, not elsewhere classified (R26.2) ?Pain - Right/Left: Right ?Pain - part of body: Knee ?  ? ? ?Time: 4854-6270 ?PT Time Calculation (min) (ACUTE ONLY): 12 min ? ?Charges:  $Therapeutic Activity: 8-22 mins          ?          ?Iva Boop, PT  ?12/19/21. 9:45 AM ? ? ?

## 2021-12-19 NOTE — Plan of Care (Signed)
  Problem: Education: Goal: Knowledge of General Education information will improve Description: Including pain rating scale, medication(s)/side effects and non-pharmacologic comfort measures Outcome: Progressing   Problem: Health Behavior/Discharge Planning: Goal: Ability to manage health-related needs will improve Outcome: Progressing   Problem: Clinical Measurements: Goal: Ability to maintain clinical measurements within normal limits will improve Outcome: Progressing Goal: Will remain free from infection Outcome: Progressing Goal: Diagnostic test results will improve Outcome: Progressing Goal: Cardiovascular complication will be avoided Outcome: Progressing   Problem: Activity: Goal: Risk for activity intolerance will decrease Outcome: Progressing   Problem: Nutrition: Goal: Adequate nutrition will be maintained Outcome: Progressing   Problem: Elimination: Goal: Will not experience complications related to bowel motility Outcome: Progressing   Problem: Pain Managment: Goal: General experience of comfort will improve Outcome: Progressing   Problem: Safety: Goal: Ability to remain free from injury will improve Outcome: Progressing   Problem: Skin Integrity: Goal: Risk for impaired skin integrity will decrease Outcome: Progressing   

## 2021-12-19 NOTE — TOC Progression Note (Signed)
Transition of Care (TOC) - Progression Note  ? ? ?Patient Details  ?Name: Taffie Eckmann ?MRN: [id number] ?Date of Birth: 08-27-1950 ? ?Transition of Care (TOC) CM/SW Contact  ?Conception Oms, RN ?Phone Number: ?12/19/2021, 9:20 AM ? ?Clinical Narrative:    ? ?Spoke with the patient's daughter [name] and the patient on the phone, We discussed the pros and cons of going home without having Home health including PT and Nursing, We discussed she would have to go to outpatient day surgery once weekly to get PICC line dressing changed and labs drawn, We also discussed that she would have to go to Outpatient PT a couple of times per week, we discussed that she would need someone with her daily and have help, She would have IV ABX for 4 weeks and some one would need to assist in  administering it.  We then discussed the pros of going to SNF [facility], we talked about she would get PT daily instead of a couple of times per week, she would have a nurse administer  the IV ABX and manage the PICC line, and she would have 24 hour assistance when needed,  We reviewed the bed options, Her daughter [name] will call and speak to her sister in [place] and she will go to [employer] and tour the facility. She is leaning toward taking her home, She will call me back, she has had the opportunity to see the facility and to call her sister. ? ?  ?  ? ?Expected Discharge Plan and Services ?  ?  ?  ?  ?  ?                ?  ?  ?  ?  ?  ?  ?  ?  ?  ?  ? ? ?Social Determinants of Health (SDOH) Interventions ?  ? ?Readmission Risk Interventions ?   ? View : No data to display.  ?  ?  ?  ? ? ?

## 2021-12-19 NOTE — Progress Notes (Signed)
? ?Date of Admission:  12/16/2021   T ? ?ID: Lydia Elliott is a 71 y.o. female  ?Principal Problem: ?  Septic joint of right knee joint ([place]) ?Active Problems: ?  Hypertension ?  Hyperlipidemia ?  CAD (coronary artery disease) ?  Thalassemia ?  Depression with anxiety ? ? ? ?Subjective: ?Pt doing better ?Pain rt knee better ? ? ?Medications:  ? aspirin EC  325 mg Oral Daily  ? atorvastatin  20 mg Oral Daily  ? busPIRone  5 mg Oral Daily  ? carvedilol  3.125 mg Oral BID WC  ? enoxaparin (LOVENOX) injection  40 mg Subcutaneous Q24H  ? escitalopram  20 mg Oral Daily  ? losartan  25 mg Oral Daily  ? pantoprazole  40 mg Oral Daily  ? ? ?Objective: ?Vital signs in last 24 hours: ?Temp:  [98 ?F (36.7 ?C)-99.2 ?F (37.3 ?C)] 99.2 ?F (37.3 ?C) (05/10 1955) ?Pulse Rate:  [77-87] 81 (05/10 1955) ?Resp:  [16-20] 18 (05/10 1955) ?BP: (113-175)/(54-70) 144/55 (05/10 1955) ?SpO2:  [91 %-100 %] 100 % (05/10 1955) ? ?LDA ?None ?PHYSICAL EXAM:  ?General: Alert, cooperative, no distress, appears stated age.  ?Head: Normocephalic, without obvious abnormality, atraumatic. ?Eyes: Conjunctivae clear, anicteric sclerae. Pupils are equal ?ENT Nares normal. No drainage or sinus tenderness. ?Lips, mucosa, and tongue normal. No Thrush ?Neck: Supple, symmetrical, no adenopathy, thyroid: non tender ?no carotid bruit and no JVD. ?Back: No CVA tenderness. ?Lungs: Clear to auscultation bilaterally. No Wheezing or Rhonchi. No rales. ?Heart: Regular rate and rhythm, no murmur, rub or gallop. ?Abdomen: Soft, non-tender,not distended. Bowel sounds normal. No masses ?Extremities: Right knee dressing not removed ?Skin: No rashes or lesions. Or bruising ?Lymph: Cervical, supraclavicular normal. ?Neurologic: Grossly non-focal ? ?Lab Results ?Recent Labs  ?  12/17/21 ?6144 12/17/21 ?3154 12/19/21 ?0327  ?WBC 7.5  --   --   ?HGB 8.8*  --   --   ?HCT 29.6*  --   --   ?CREATININE  --  0.65 0.66  ? ? ? ?Microbiology: ?Synovial tissue right knee cultures- Strep  Group G ?Synovial fluid right knee cultures so far negative ?Pathology pending ? ? ?Assessment/Plan: ?71 year old female with history of psoriatic arthritis on Humira and prednisone with recurrent intra-articular steroid injection to the right knee has had right knee pain for many months.  Underwent arthroscopy in February followed by a subcutaneous abscess at the site of the arthroscopy which was Staph epidermidis and was treated with ciprofloxacin for 10 days.  Now presents with acute onset of pain and severe swelling.  Has had a wound at the site of the medial tibial area. ? ?Right knee septic arthritis to be ruled out in a patient with immune compromised history.underwent [facility] ?Patient is currently on vancomycin and cefepime.  Cultures were sent from synovial fluid and synovial tissue  - Streptococcus Group G - not sure whether this is a true pathogen or contaminant- Synovial fluid culture pending. Blood culture neg so far ?Discussed with patient- may treat her with ceftriaxone and see whether the infection resolves- if no significant improvement in 1 week then may add dapto  ? ? she is going to need IV antibiotics on discharge for at least 4 weeks. ? 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 ? ?Thalassemia ? ?Anxiety/depression on Lexapro, BuSpar, Ambien, Xanax as needed ?CAD status post stent.  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 ?On atorvastatin, Not taking aspirin anymore. ? ?Hypertension on losartan and carvedilol. ? ?  Discussed the management with the patient and care team ?

## 2021-12-19 NOTE — Progress Notes (Signed)
?  Subjective: ? ?POD #2 s/p arthroscopic irrigation debridement of right septic knee joint with open debridement of lateral ulcer wound.   Patient reports right knee pain as mild to moderate.  Patient states she was able to walk out in the hallway today.  Her pain has improved compared to preop. ? ?Objective:  ? ?VITALS:   ?Vitals:  ? 12/18/21 2334 12/19/21 0457 12/19/21 0804 12/19/21 1222  ?BP: (!) 134/54 (!) 159/56 (!) 175/70 (!) 113/57  ?Pulse: 82 79 84 77  ?Resp: '20 20  16  '$ ?Temp: 98.2 ?F (36.8 ?C) 98.2 ?F (36.8 ?C) 98.2 ?F (36.8 ?C) 98.1 ?F (36.7 ?C)  ?TempSrc:      ?SpO2: 96% 91% 98% 97%  ?Weight:      ?Height:      ? ? ?PHYSICAL EXAM: ?Right lower extremity: ?I personally change the patient's dressings today.  Patient's knee effusion has greatly improved.  There is no associated erythema.  There appears to be no active drainage from her incisions.  There is no purulent drainage seen.Marland Kitchen ?Neurovascular intact ?Sensation intact distally ?Intact pulses distally ?Dorsiflexion/Plantar flexion intact ?Dressings: Moderate serous drainage on her bandages. ?No cellulitis present ?Compartment soft ? ?LABS ? ?Results for orders placed or performed during the hospital encounter of 12/16/21 (from the past 24 hour(s))  ?Creatinine, serum     Status: None  ? Collection Time: 12/19/21  3:27 AM  ?Result Value Ref Range  ? Creatinine, Ser 0.66 0.44 - 1.00 mg/dL  ? GFR, Estimated >60 >60 mL/min  ?CK     Status: None  ? Collection Time: 12/19/21  3:27 AM  ?Result Value Ref Range  ? Total CK 54 38 - 234 U/L  ?Glucose, capillary     Status: Abnormal  ? Collection Time: 12/19/21  8:06 AM  ?Result Value Ref Range  ? Glucose-Capillary 102 (H) 70 - 99 mg/dL  ? ? ?No results found. ? ?Assessment/Plan: ?2 Days Post-Op  ? ?Principal Problem: ?  Septic joint of right knee joint ([place]) ?Active Problems: ?  Hypertension ?  Hyperlipidemia ?  CAD (coronary artery disease) ?  Thalassemia ?  Depression with anxiety ? ?The patient has strep G  species growing from her surgical synovial fluid culture.  Continue current IV antibiotic treatment.  Patient will need a PICC line for prolonged IV antibiotics per infectious disease.  Appreciate Dr. Steva Ready consulting.  Patient has no fevers or other signs of systemic infection.  Ace wrap applied over the right knee for compression.  Continue elevation.  Patient may apply ice packs to the right knee as needed.  Possible discharge tomorrow pending PICC placement and antibiotic administration training. ? ? ? ?Thornton Park , MD ?12/19/2021, 2:07 PM ? ? ? ? ?  ?

## 2021-12-19 NOTE — Progress Notes (Signed)
Peripherally Inserted Central Catheter Placement ? ?The IV Nurse has discussed with the patient and/or persons authorized to consent for the patient, the purpose of this procedure and the potential benefits and risks involved with this procedure.  The benefits include less needle sticks, lab draws from the catheter, and the patient may be discharged home with the catheter. Risks include, but not limited to, infection, bleeding, blood clot (thrombus formation), and puncture of an artery; nerve damage and irregular heartbeat and possibility to perform a PICC exchange if needed/ordered by physician.  Alternatives to this procedure were also discussed.  Bard Power PICC patient education guide, fact sheet on infection prevention and patient information card has been provided to patient /or left at bedside.PICC placed by Norlen Apolinario RN.   ? ?PICC Placement Documentation  ?PICC Single Lumen 54/00/86 Right Basilic 35 cm 0 cm (Active)  ?Indication for Insertion or Continuance of Line Prolonged intravenous therapies 12/19/21 2255  ?Exposed Catheter (cm) 0 cm 12/19/21 2255  ?Site Assessment Clean, Dry, Intact 12/19/21 2255  ?Line Status Blood return noted;Flushed;Saline locked 12/19/21 2255  ?Dressing Type Transparent;Securing device 12/19/21 2255  ?Dressing Status Clean, Dry, Intact;Antimicrobial disc in place 12/19/21 2255  ?Safety Lock Not Applicable 76[birth year] 9326  ?Line Care Connections checked and tightened 12/19/21 2255  ?Line Adjustment (NICU/IV Team Only) No 12/19/21 2255  ?Dressing Intervention New dressing 12/19/21 2255  ?Dressing Change Due 12/26/21 12/19/21 2255  ? ? ? ? ? ?Aldona Lento L ?12/19/2021, 10:59 PM ? ?

## 2021-12-19 NOTE — Treatment Plan (Signed)
Diagnosis: ?Septic arthritis rt knee ?Baseline Creatinine <1 ? ?Culture Result: Group G streptococcus ? ?Allergies  ?Allergen Reactions  ? Rosuvastatin   ?  Muscle Pain  ? ? ?OPAT Orders ?Discharge antibiotics: ?Ceftriaxone 2 grams Iv every 24 hours  ?Duration: 4 weeks ? ?End Date:01/15/22 ? ? ?[facility] Care Per Protocol: ? ?Labs weekly while on IV antibiotics: ?_X_ CBC with differential ?__ BMP ?_X_ CMP ?_X_ CRP ?_X_ ESR ? ? ?_X_ Please pull PIC at completion of IV antibiotics ? ? ?Fax weekly labs to [phone] ? ?Clinic Follow Up Appt: 01/01/22 at 10.15 am ? ? ?Call [phone] with any questions ? ?  ?

## 2021-12-19 NOTE — Progress Notes (Cosign Needed)
Patient is not able to walk the distance required to go the bathroom, or he/she is unable to safely negotiate stairs required to access the bathroom.  A 3in1 BSC will alleviate this problem  

## 2021-12-19 NOTE — Plan of Care (Signed)
  Problem: Education: Goal: Knowledge of General Education information will improve Description Including pain rating scale, medication(s)/side effects and non-pharmacologic comfort measures Outcome: Progressing   Problem: Activity: Goal: Risk for activity intolerance will decrease Outcome: Progressing   Problem: Safety: Goal: Ability to remain free from injury will improve Outcome: Progressing   

## 2021-12-19 NOTE — Progress Notes (Cosign Needed)
DATE ____5/10/23_________________________ ? ?     PATIENT NAME__[name]________________________________________________  PATIENT MRN_[id number]_________ ? ?     DIAGNOSIS/DIAGNOSIS CODE ____M00.9___________________________________ DATE OF DISCHARGE: ______________ ? ?     PRIMARY CARE PHYSICIAN __Fitzgerald______________________________    PCP PHONE/FAX___________________________ ? ?  ? ? Dear Provider (Name: [facility] [phone]___________  Fax: ___________________________): ?  ?I certify that I have examined this patient and that occupational/physical/speech therapy is necessary on an outpatient basis.   ? ?The patient has expressed interest in completing their recommended course of therapy at your  ?location.  Once a formal order from the patient's primary care physician has been obtained, please  contact him/her to schedule an appointment for evaluation at your earliest convenience. ? ? ?[  X]  Physical Therapy Evaluate and Treat ? ?        [  ]  Occupational Therapy Evaluate and Treat ? ?                                                           [  ]  Speech Therapy Evaluate and Treat ? ? ? ? ? ? ? ? ?The patient's primary care physician (listed above) must furnish and be responsible for a formal order such that the recommended services may be furnished while under the primary physician's care, and that the plan of care will be established and reviewed every 30 days (or more often if condition necessitates). ? ? ?_________________________________________________                             ____________                                ?Hospitalist/Attending Physician Signature  NOTE:  Diagnosis and signature are required to validate order                 Date                                                ? ? ?____________________________________________________________________ ?Printed Hospitalist/Attending Physician Name ?

## 2021-12-19 NOTE — Progress Notes (Signed)
[place] at [facility] ? ? ?PATIENT NAME: Lydia Elliott   ? ?MR#:  [id number] ? ?DATE OF BIRTH:  07-19-1951 ? ?SUBJECTIVE:  ? ?patient came in with right knee swelling since Saturday night denies any recent injury. She had previous right knee arthroscopy for partial medial meniscectomy in February 9379 complicated by subcutaneous abscess treated with arthroscopic wash out with IND and completed a course of antibiotic. ? ?No fever. No family at bedside. Patient has history of psoriatic arthritis and was started on humira and prednisone by outpatient rheumatology ? ?Ambulating in the hallways with PT ? ?VITALS:  ?Blood pressure (!) 113/57, pulse 77, temperature 98.1 ?F (36.7 ?C), resp. rate 16, height 5' (1.524 m), weight 67.6 kg, SpO2 97 %. ? ?PHYSICAL EXAMINATION:  ? ?GENERAL:  71 y.o.-year-old patient lying in the bed with no acute distress.  ?LUNGS: Normal breath sounds bilaterally, no wheezing, rales, rhonchi.  ?CARDIOVASCULAR: S1, S2 normal. No murmurs, rubs, or gallops.  ?ABDOMEN: Soft, nontender, nondistended. Bowel sounds present.  ?EXTREMITIES: right knee swelling much improved per Dr Raliegh Ip ? ?NEUROLOGIC: nonfocal  patient is alert and awake ?SKIN: as above ? ?LABORATORY PANEL:  ?CBC ?Recent Labs  ?Lab 12/17/21 ?0240  ?WBC 7.5  ?HGB 8.8*  ?HCT 29.6*  ?PLT 206  ? ? ? ?Chemistries  ?Recent Labs  ?Lab 12/16/21 ?0913 12/17/21 ?9735 12/19/21 ?0327  ?NA 139  --   --   ?K 3.7  --   --   ?CL 105  --   --   ?CO2 24  --   --   ?GLUCOSE 113*  --   --   ?BUN 17  --   --   ?CREATININE 0.72   < > 0.66  ?CALCIUM 8.9  --   --   ?AST 17  --   --   ?ALT 14  --   --   ?ALKPHOS 75  --   --   ?BILITOT 1.4*  --   --   ? < > = values in this interval not displayed.  ? ? ?Cardiac Enzymes ?No results for input(s): TROPONINI in the last 168 hours. ?RADIOLOGY:  ?No results found. ? ?Assessment and Plan ? Lydia Elliott is a 71 y.o. female with medical history significant of hypertension, hyperlipidemia, GERD,  depression with anxiety, anemia, thalassemia, Takotsubo cardiomyopathy, Psoriatec arthritis, IBS, CAD, who presents with right knee pain. ?  ?She has a complex history of recent arthroscopy of her right knee, complicated by infection. She does not have any hardware in her knee. Her last procedure was in March, when she had an arthroscopy that showed no joint infection or inflammation, and she has been off antibiotics since then. ? ?Right knee swelling and fluid aspiration suggestive septic right knee joint ?history of right knee arthroscopy and repair for partial meniscal tear February 2023 ?history of right knee infection with arthroscopic washout and incision drainage of abscess March 2023 ?-- continue IV vancomycin and cefepime ?-- synovial fluid white count more than 57,000 predominantly neutrophil. ?-- Orthopedic consultation with Dr. Mack Guise-- patient is s/p right knee arthroscopic washout 12/17/2021 ?-- blood culture negative ?-- MR SA PCR negative ?-- ID consultation with Dr. Steva Ready-- will need IV antibiotics for four weeks. Will get PICC line ?-- PRN pain meds ?--WBC 16.4--7.5 ?--Synovial fluid Culture-- grp G streptococcus. ?--BC negative x 3 days ?--PT recommends rehab ? ?Depression with anxiety ?-- Continue home medications ?  ?Thalassemia ?--Thalassemia and microcytic anemia: Hemoglobin stable  10.6 (9.6 on 10/30/2021) ?  ?CAD (coronary artery disease) ?- Aspirin, Lipitor ?  ?Hyperlipidemia ?- Lipitor ?  ?Hypertension ?- IV hydralazine as needed ?-Coreg, Cozaar ? ? ?Family communication :dter on the phone 5/9 ?Consults : orthopedic, ID ?CODE STATUS: full ?DVT Prophylaxis : Lovenox after procedure ?Level of care: Med-Surg ?Status is: Inpatient ?Remains inpatient appropriate because: right knee septic arthritis. Awaiting fluid cx results and determination for IV abx ?PICC to be placed ? ?TOC for d/c to rehab ?  ? ?TOTAL TIME TAKING CARE OF THIS PATIENT: 35 minutes.  ?>50% time spent on counselling and  coordination of care ? ?Note: This dictation was prepared with Dragon dictation along with smaller phrase technology. Any transcriptional errors that result from this process are unintentional. ? ?Fritzi Mandes M.D  ? ? ?[facility]  ? ?CC: ?Primary care physician; Leonel Ramsay, MD  ?

## 2021-12-19 NOTE — TOC Progression Note (Signed)
Transition of Care (TOC) - Progression Note  ? ? ?Patient Details  ?Name: Lydia Elliott ?MRN: [id number] ?Date of Birth: 02-Apr-1951 ? ?Transition of Care (TOC) CM/SW Contact  ?Conception Oms, RN ?Phone Number: ?12/19/2021, 10:22 AM ? ?Clinical Narrative:    ? ?Spoke with the patient and her daughter Lydia Elliott again, They have decided she will go home, Her daughter will be staying with her for the 4 weeks needed for the IV ABX, Pam with [employer] will provide the teaching, The patient will need a 3 in1 and [employer] will deliver to the bedside ?She has a rolling walker at home with front wheels, her daughter will provide transportation to go to Outpatient PT here at [facility], I explained they would call her for appointment, she will go to Outpatient day surgery for PICC line care and Labs ?She worked with PT today and did much better with ambulation. ? ? ?  ?  ? ?Expected Discharge Plan and Services ?  ?  ?  ?  ?  ?                ?  ?  ?  ?  ?  ?  ?  ?  ?  ?  ? ? ?Social Determinants of Health (SDOH) Interventions ?  ? ?Readmission Risk Interventions ?   ? View : No data to display.  ?  ?  ?  ? ? ?

## 2021-12-20 DIAGNOSIS — M009 Pyogenic arthritis, unspecified: Secondary | ICD-10-CM | POA: Diagnosis not present

## 2021-12-20 LAB — C-REACTIVE PROTEIN: CRP: 6.3 mg/dL — ABNORMAL HIGH (ref <1.0)

## 2021-12-20 LAB — SEDIMENTATION RATE: Sed Rate: 56 mm/hr — ABNORMAL HIGH (ref 0–30)

## 2021-12-20 LAB — LYME DISEASE SEROLOGY W/REFLEX: Lyme Total Antibody EIA: NEGATIVE

## 2021-12-20 MED ORDER — HYDROCODONE-ACETAMINOPHEN 5-325 MG PO TABS: 1.0000 | ORAL_TABLET | Freq: Four times a day (QID) | ORAL | 0 refills | Status: DC | PRN

## 2021-12-20 MED ORDER — CEFTRIAXONE IV (FOR PTA / DISCHARGE USE ONLY): 2.0000 g | INTRAVENOUS | 0 refills | Status: AC

## 2021-12-20 MED ORDER — ADALIMUMAB 40 MG/0.8ML ~~LOC~~ PSKT: PREFILLED_SYRINGE | SUBCUTANEOUS | 0 refills | Status: DC

## 2021-12-20 NOTE — Progress Notes (Signed)
PHARMACY CONSULT NOTE FOR: ? ?OUTPATIENT  PARENTERAL ANTIBIOTIC THERAPY (OPAT) ? ?Indication: septic arthritis of knee ?Regimen: ceftriaxone 2gm IV q24h ?End date: 01/15/2022 ? ?IV antibiotic discharge orders are pended. ?To discharging provider:  please sign these orders via discharge navigator,  ?Select New Orders & click on the button choice - Manage This Unsigned Work.  ?  ? ?Thank you for allowing pharmacy to be a part of this patient's care. ? ?Doreene Eland, PharmD, BCPS, BCIDP ?Work Cell: [phone] ?12/20/2021 8:41 AM ? ? ?

## 2021-12-20 NOTE — TOC Progression Note (Signed)
Transition of Care (TOC) - Progression Note  ? ? ?Patient Details  ?Name: Jayah Balthazar ?MRN: [id number] ?Date of Birth: 11/01/1950 ? ?Transition of Care (TOC) CM/SW Contact  ?Conception Oms, RN ?Phone Number: ?12/20/2021, 8:56 AM ? ?Clinical Narrative:    ?Requested appointments in same day surgery for Labs and PICC line care for  ? ?5/16, 5/23, 5/30, and 6/6  ?She will go to Outpatient PT for Physical therapy ? ?Expected Discharge Plan: Home/Self Care ?Barriers to Discharge: Continued Medical Work up ? ?Expected Discharge Plan and Services ?Expected Discharge Plan: Home/Self Care ?  ?Discharge Planning Services: CM Consult ?  ?Living arrangements for the past 2 months: [place] ?                ?DME Arranged: 3-N-1 ?DME Agency: [employer] ?Date DME Agency Contacted: 12/19/21 ?Time DME Agency Contacted: [id number] ?Representative spoke with at DME Agency: intake ?HH Arranged: NA ?[place] Agency: NA ?  ?  ?  ? ? ?Social Determinants of Health (SDOH) Interventions ?  ? ?Readmission Risk Interventions ?   ? View : No data to display.  ?  ?  ?  ? ? ?

## 2021-12-20 NOTE — Discharge Summary (Signed)
?Physician Discharge Summary ?  ?Patient: Lydia Elliott MRN: [id number] DOB: Apr 02, 1951  ?Admit date:     12/16/2021  ?Discharge date: 12/20/21  ?Discharge Physician: Fritzi Mandes  ? ?PCP: Leonel Ramsay, MD  ? ?Recommendations at discharge:  ? ?follow-up with Dr. Mack Guise in 1 to 2 week ?follow-up infectious disease Dr. Steva Ready 23rd may at 1015 am ?follow-up with your PCP in 1 to 2 weeks ? ?Discharge Diagnoses: ?right knee septic arthritis status post arthroscopic washout ? ?Hospital Course: ?Lydia Elliott is a 71 y.o. female with medical history significant of hypertension, hyperlipidemia, GERD, depression with anxiety, anemia, thalassemia, Takotsubo cardiomyopathy, Psoriatec arthritis, IBS, CAD, who presents with right knee pain. ?  ?She has a complex history of recent arthroscopy of her right knee, complicated by infection. She does not have any hardware in her knee. Her last procedure was in March, when she had an arthroscopy that showed no joint infection or inflammation, and she has been off antibiotics since then. ?  ?Right knee swelling and fluid aspiration suggestive septic right knee joint ?history of right knee arthroscopy and repair for partial meniscal tear February 2023 ?history of right knee infection with arthroscopic washout and incision drainage of abscess March 2023 ?-- was started on  IV vancomycin and cefepime-->IV rocephin per ID at d/c ?-- synovial fluid white count more than 57,000 predominantly neutrophil. ?-- Orthopedic consultation with Dr. Mack Guise-- patient is s/p right knee arthroscopic washout 12/17/2021 ?-- blood culture negative ?-- MR SA PCR negative ?-- ID consultation with Dr. Steva Ready-- will need IV rocephin for four weeks. S/p PICC line ?-- PRN pain meds ?--WBC 16.4--7.5 ?--Synovial fluid Culture-- grp G streptococcus. ?--BC negative x 3 days ?--PT recommends rehab--pt opted for out pt PT ?  ?Depression with anxiety ?-- Continue home medications ?   ?Thalassemia ?--Thalassemia and microcytic anemia: Hemoglobin stable 10.6 (9.6 on 10/30/2021) ?  ?CAD (coronary artery disease) ?- Aspirin, Lipitor ?  ?Hyperlipidemia ?- Lipitor ?  ?Hypertension ?- IV hydralazine as needed ?-Coreg, Cozaar ?  ?  ?Family communication : none today ?Consults : orthopedic, ID ?CODE STATUS: full ?DVT Prophylaxis : Lovenox after procedure ?Level of care: Med-Surg ?Status is: Inpatient ? ?D/c home with out pt PT and f/u. Pt agreeable ? ?  ? ?Ace wrap applied over the right knee for compression.  Continue elevation.  Patient may apply ice packs to the right knee as needed. ? ?Procedures performed: right knee arthroscopic washout  ?Disposition: Home ?Diet recommendation:  ?Discharge Diet Orders (From admission, onward)  ? ?  Start     Ordered  ? 12/20/21 0000  Diet - low sodium heart healthy       ? 12/20/21 1239  ? ?  ?  ? ?  ? ?Cardiac diet ?DISCHARGE MEDICATION: ?Allergies as of 12/20/2021   ? ?   Reactions  ? Rosuvastatin   ? Muscle Pain  ? ?  ? ?  ?Medication List  ?  ? ?TAKE these medications   ? ?Adalimumab 40 MG/0.8ML Pskt ?Discuss with your Rheumatologist before taking this given current infection ?What changed:  ?how much to take ?how to take this ?when to take this ?additional instructions ?  ?ALPRAZolam 0.25 MG tablet ?Commonly known as: Duanne Moron ?Take 0.25 mg by mouth 2 (two) times daily as needed for anxiety. ?  ?aspirin EC 325 MG tablet ?Take 1 tablet (325 mg total) by mouth daily. ?  ?atorvastatin 20 MG tablet ?Commonly known as: LIPITOR ?Take 20 mg by mouth daily. ?  ?  busPIRone 5 MG tablet ?Commonly known as: BUSPAR ?Take 5 mg by mouth daily. ?  ?carvedilol 3.125 MG tablet ?Commonly known as: COREG ?Take 3.125 mg by mouth 2 (two) times daily with a meal. ?  ?cefTRIAXone  IVPB ?Commonly known as: ROCEPHIN ?Inject 2 g into the vein daily for 25 days. Indication:  septic arthritis of knee ?First Dose: Yes ?Last Day of Therapy:  01/15/2022 ?Labs - Once weekly:  CBC/D, CMP, ESR and  CRP ?Please pull PIC at completion of IV antibiotics ?Fax weekly labs to [phone] ?Method of administration: IV Push ?Method of administration may be changed at the discretion of home infusion pharmacist based upon assessment of the patient and/or caregiver's ability to self-administer the medication ordered. ?Start taking on: Dec 21, 2021 ?  ?diclofenac Sodium 1 % Gel ?Commonly known as: VOLTAREN ?Apply 1 application topically 2 (two) times daily as needed (pain). ?  ?docusate sodium 100 MG capsule ?Commonly known as: Colace ?Take 1 capsule (100 mg total) by mouth daily as needed. ?  ?escitalopram 20 MG tablet ?Commonly known as: LEXAPRO ?TAKE 1 TABLET BY MOUTH EVERY DAY ?  ?HYDROcodone-acetaminophen 5-325 MG tablet ?Commonly known as: Norco ?Take 1-2 tablets by mouth every 6 (six) hours as needed for moderate pain. ?What changed: when to take this ?  ?losartan 25 MG tablet ?Commonly known as: COZAAR ?Take 25 mg by mouth 2 (two) times a day. ?  ?omeprazole 20 MG capsule ?Commonly known as: PRILOSEC ?Take 20 mg by mouth daily. ?  ?zolpidem 10 MG tablet ?Commonly known as: AMBIEN ?Take 5 mg by mouth at bedtime as needed for sleep. ?  ? ?  ? ?  ?  ? ? ?  ?Discharge Care Instructions  ?(From admission, onward)  ?  ? ? ?  ? ?  Start     Ordered  ? 12/20/21 0000  Change dressing on IV access line weekly and PRN  (Home infusion instructions - [facility] )       ? 12/20/21 1239  ? 12/20/21 0000  Discharge wound care:       ?Comments: Per Dr Marilynne Halsted wrap applied over the right knee for compression.  Continue elevation.  Patient may apply ice packs to the right knee as needed.  ? 12/20/21 1239  ? ?  ?  ? ?  ? ? Follow-up Information   ? ? Tsosie Billing, MD. Go on 01/01/2022.   ?Specialty: Infectious Diseases ?Why: at 10:15 am ?Contact information: ?[address] [place] [address] ?[phone] ? ? ?  ?  ? ? Leonel Ramsay, MD. Schedule an appointment as soon as possible for a  visit in 1 week(s).   ?Specialty: Infectious Diseases ?Why: hospital f/u ?Contact information: ?[address] ?[place] [address] ?[phone] ? ? ?  ?  ? ? Thornton Park, MD. Schedule an appointment as soon as possible for a visit in 1 week(s).   ?Specialty: Orthopedic Surgery ?Why: in 1-2 weeks ?Contact information: ?[place] [address] ?[phone] ? ? ?  ?  ? ?  ?  ? ?  ? ?Discharge Exam: ?Filed Weights  ? 12/16/21 1234 12/17/21 1646  ?Weight: 67.6 kg 67.6 kg  ? ? ? ?Condition at discharge: fair ? ?The results of significant diagnostics from this hospitalization (including imaging, microbiology, ancillary and laboratory) are listed below for reference.  ? ?Imaging Studies: ?DG Knee Complete 4 Views Right ? ?Result Date: 12/16/2021 ?CLINICAL DATA:  71 year old female with increased knee pain and swelling. Status post knee surgery last month. EXAM: RIGHT KNEE - COMPLETE 4+ VIEW COMPARISON:  None Available. FINDINGS: Patellofemoral compartment joint space loss and some degenerative spurring. Medial and lateral joint spaces better maintained. Patella appears intact. Increased density in Hoffa's fat pad, possible small joint effusion. No soft tissue gas. No acute osseous abnormality identified. IMPRESSION: 1. Patellofemoral compartment degeneration with superimposed joint space granulation versus small effusion. 2. No acute osseous abnormality identified. Electronically Signed   By: Genevie Ann M.D.   On: 12/16/2021 10:05  ? ?[place] EKG SITE RITE ? ?Result Date: 12/19/2021 ?If [employer] not attached, placement could not be confirmed due to current cardiac rhythm.  ? ?Microbiology: ?Results for orders placed or performed during the hospital encounter of 12/16/21  ?Blood culture (single)     Status: None (Preliminary result)  ? Collection Time: 12/16/21  9:14 AM  ? Specimen: BLOOD  ?Result Value Ref Range Status  ? Specimen Description BLOOD BLOOD RIGHT HAND  Final  ? Special Requests    Final  ?  BOTTLES DRAWN AEROBIC AND ANAEROBIC Blood Culture results may not be optimal due to an inadequate volume of blood received in culture bottles  ? Culture   Final  ?  NO GROWTH 4 DAYS ?Performed at [facility]

## 2021-12-20 NOTE — Care Management Important Message (Signed)
Important Message ? ?Patient Details  ?Name: Lydia Elliott ?MRN: [id number] ?Date of Birth: 1951-06-23 ? ? ?Medicare Important Message Given:  Yes ? ? ? ? ?Juliann Pulse A Kimmie Doren ?12/20/2021, 10:18 AM ?

## 2021-12-20 NOTE — Progress Notes (Signed)
Physical Therapy Treatment ?Patient Details ?Name: Lydia Elliott ?MRN: [id number] ?DOB: 01/17/1951 ?Today's Date: 12/20/2021 ? ? ?History of Present Illness Patient is a 71 year old female who presented to [facility] ED on 12/16/21 due to R knee pain. Patient has a PMH (+) for hypertension, hyperlipidemia, GERD, depression with anxiety, anemia, thalassemia, Takotsubo cardiomyopathy, Psoriatec arthritis, IBS, CAD. Patient was here end of March for a R knee arthroscopy. Arthroscopic irrigation and debridement of right septic knee joint with open debridement of the lateral knee ulceration wound performed on 12/17/21. ? ?  ?PT Comments  ? ? Pt was long sitting in bed upon arriving. She is A and O x 4. Agrees to session and is eager to DC home. She demonstrated safe ability to exit bed, stand, and ambulate without LOB or safety concern. Safely performed ascending/descending 4 stair with VC only. Pt is cleared for safe DC home with OP PT to assist pt to maximal independence with ADLs.  ?    ?Recommendations for follow up therapy are one component of a multi-disciplinary discharge planning process, led by the attending physician.  Recommendations may be updated based on patient status, additional functional criteria and insurance authorization. ? ?Follow Up Recommendations ? Outpatient PT ?  ?  ?Assistance Recommended at Discharge PRN  ?Patient can return home with the following A little help with walking and/or transfers;A little help with bathing/dressing/bathroom;Assistance with cooking/housework;Assist for transportation;Help with stairs or ramp for entrance ?  ?Equipment Recommendations ? None recommended by PT  ?  ?   ?Precautions / Restrictions Precautions ?Precautions: Fall ?Restrictions ?Weight Bearing Restrictions: No  ?  ? ?Mobility ? Bed Mobility ?Overal bed mobility: Modified Independent ?  ?Transfers ?Overall transfer level: Modified independent ?  ?Ambulation/Gait ?Ambulation/Gait assistance: Supervision ?Gait  Distance (Feet): 150 Feet ?Assistive device: Rolling walker (2 wheels) ?Gait Pattern/deviations: Step-through pattern ?Gait velocity: decreased ?  ?  ?General Gait Details: Pt was able to ambulate 150 ft with supervision ? ? ?Stairs ?Stairs: Yes ?Stairs assistance: Supervision ?Stair Management: One rail Right, Step to pattern, Sideways ?Number of Stairs: 4 ?General stair comments: no LOB or safety concerns with stair navigation ? ? ?  ?Balance Overall balance assessment: Modified Independent ?  ?  ?Cognition Arousal/Alertness: Awake/alert ?Behavior During Therapy: [facility] for tasks assessed/performed ?Overall Cognitive Status: Within Functional Limits for tasks assessed ?  ? General Comments: Pt is A and Ox 4 ?  ?  ? ?  ?   ?   ? ?Pertinent Vitals/Pain Pain Assessment ?Pain Assessment: 0-10 ?Pain Score: 3  ?Pain Location: R knee ?Pain Descriptors / Indicators: Dull, Aching, Discomfort ?Pain Intervention(s): Limited activity within patient's tolerance, Monitored during session, Premedicated before session, Repositioned, Ice applied  ? ? ? ?PT Goals (current goals can now be found in the care plan section) Acute Rehab PT Goals ?Patient Stated Goal: " go home today." ?Progress towards PT goals: Progressing toward goals ? ?  ?Frequency ? ? ? 7X/week ? ? ? ?  ?PT Plan Current plan remains appropriate  ? ? ?   ?AM-PAC PT "6 Clicks" Mobility   ?Outcome Measure ? Help needed turning from your back to your side while in a flat bed without using bedrails?: A Little ?Help needed moving from lying on your back to sitting on the side of a flat bed without using bedrails?: A Little ?Help needed moving to and from a bed to a chair (including a wheelchair)?: A Little ?Help needed standing up from a chair  using your arms (e.g., wheelchair or bedside chair)?: A Little ?Help needed to walk in hospital room?: A Little ?Help needed climbing 3-5 steps with a railing? : A Little ?6 Click Score: 18 ? ?  ?End of Session   ?Activity Tolerance:  Patient tolerated treatment well ?Patient left: in chair;with call bell/phone within reach ?Nurse Communication: Mobility status ?PT Visit Diagnosis: Unsteadiness on feet (R26.81);Muscle weakness (generalized) (M62.81);Pain;Difficulty in walking, not elsewhere classified (R26.2) ?Pain - Right/Left: Right ?Pain - part of body: Knee ?  ? ? ?Time: [phone] ?PT Time Calculation (min) (ACUTE ONLY): 18 min ? ?Charges:  $Gait Training: 8-22 mins          ?          ? ?Julaine Fusi PTA ?12/20/21, 9:25 AM  ? ?

## 2021-12-20 NOTE — TOC Progression Note (Signed)
Transition of Care (TOC) - Progression Note  ? ? ?Patient Details  ?Name: Lydia Elliott ?MRN: [id number] ?Date of Birth: 1951/04/07 ? ?Transition of Care (TOC) CM/SW Contact  ?Conception Oms, RN ?Phone Number: ?12/20/2021, 9:21 AM ? ?Clinical Narrative:    ?Faxed the outpatient PT referral to [phone], they will call the patient with appt ? ? ?Expected Discharge Plan: Home/Self Care ?Barriers to Discharge: Continued Medical Work up ? ?Expected Discharge Plan and Services ?Expected Discharge Plan: Home/Self Care ?  ?Discharge Planning Services: CM Consult ?  ?Living arrangements for the past 2 months: [place] ?                ?DME Arranged: 3-N-1 ?DME Agency: [employer] ?Date DME Agency Contacted: 12/19/21 ?Time DME Agency Contacted: [phone] ?Representative spoke with at DME Agency: intake ?HH Arranged: NA ?[place] Agency: NA ?  ?  ?  ? ? ?Social Determinants of Health (SDOH) Interventions ?  ? ?Readmission Risk Interventions ?   ? View : No data to display.  ?  ?  ?  ? ? ?

## 2021-12-20 NOTE — Progress Notes (Signed)
Patient already discharged home and will follow up with me in the office.  She is discharged on IV antibiotics. ?

## 2021-12-21 DIAGNOSIS — M009 Pyogenic arthritis, unspecified: Secondary | ICD-10-CM | POA: Diagnosis not present

## 2021-12-21 LAB — CULTURE, BLOOD (SINGLE)
Culture: NO GROWTH
Culture: NO GROWTH
Special Requests: ADEQUATE

## 2021-12-22 LAB — AEROBIC/ANAEROBIC CULTURE W GRAM STAIN (SURGICAL/DEEP WOUND): Gram Stain: NONE SEEN

## 2021-12-23 LAB — SURGICAL PATHOLOGY

## 2021-12-24 LAB — AEROBIC/ANAEROBIC CULTURE W GRAM STAIN (SURGICAL/DEEP WOUND)
Culture: NO GROWTH
Gram Stain: NONE SEEN

## 2021-12-25 ENCOUNTER — Encounter: Payer: Self-pay | Admitting: Infectious Diseases

## 2021-12-25 ENCOUNTER — Ambulatory Visit
Admit: 2021-12-25 | Discharge: 2021-12-25 | Disposition: A | Discharge status: Home / Self Care | Payer: PPO | Source: Ambulatory Visit | Attending: Infectious Diseases | Admitting: Infectious Diseases

## 2021-12-25 VITALS — BP 158/54 | HR 64 | Temp 98.5°F | Resp 16

## 2021-12-25 DIAGNOSIS — M009 Pyogenic arthritis, unspecified: Secondary | ICD-10-CM | POA: Diagnosis not present

## 2021-12-25 LAB — CBC WITH DIFFERENTIAL/PLATELET
Abs Immature Granulocytes: 0.03 10*3/uL (ref 0.00–0.07)
Basophils Absolute: 0.1 10*3/uL (ref 0.0–0.1)
Basophils Relative: 1 %
Eosinophils Absolute: 0.2 10*3/uL (ref 0.0–0.5)
Eosinophils Relative: 2 %
HCT: 28 % — ABNORMAL LOW (ref 36.0–46.0)
Hemoglobin: 8.3 g/dL — ABNORMAL LOW (ref 12.0–15.0)
Immature Granulocytes: 0 %
Lymphocytes Relative: 21 %
Lymphs Abs: 1.4 10*3/uL (ref 0.7–4.0)
MCH: 20 pg — ABNORMAL LOW (ref 26.0–34.0)
MCHC: 29.6 g/dL — ABNORMAL LOW (ref 30.0–36.0)
MCV: 67.3 fL — ABNORMAL LOW (ref 80.0–100.0)
Monocytes Absolute: 0.5 10*3/uL (ref 0.1–1.0)
Monocytes Relative: 7 %
Neutro Abs: 4.7 10*3/uL (ref 1.7–7.7)
Neutrophils Relative %: 69 %
Platelets: 284 10*3/uL (ref 150–400)
RBC: 4.16 MIL/uL (ref 3.87–5.11)
RDW: 15.7 % — ABNORMAL HIGH (ref 11.5–15.5)
Smear Review: NORMAL
WBC: 6.9 10*3/uL (ref 4.0–10.5)
nRBC: 0.3 % — ABNORMAL HIGH (ref 0.0–0.2)

## 2021-12-25 LAB — COMPREHENSIVE METABOLIC PANEL
ALT: 24 U/L (ref 0–44)
AST: 22 U/L (ref 15–41)
Albumin: 3.4 g/dL — ABNORMAL LOW (ref 3.5–5.0)
Alkaline Phosphatase: 67 U/L (ref 38–126)
Anion gap: 7 (ref 5–15)
BUN: 13 mg/dL (ref 8–23)
CO2: 27 mmol/L (ref 22–32)
Calcium: 8.7 mg/dL — ABNORMAL LOW (ref 8.9–10.3)
Chloride: 103 mmol/L (ref 98–111)
Creatinine, Ser: 0.68 mg/dL (ref 0.44–1.00)
GFR, Estimated: >60 mL/min (ref >60)
Glucose, Bld: 107 mg/dL — ABNORMAL HIGH (ref 70–99)
Potassium: 3.4 mmol/L — ABNORMAL LOW (ref 3.5–5.1)
Sodium: 137 mmol/L (ref 135–145)
Total Bilirubin: 1.1 mg/dL (ref 0.3–1.2)
Total Protein: 6.5 g/dL (ref 6.5–8.1)

## 2021-12-25 LAB — SEDIMENTATION RATE: Sed Rate: 17 mm/hr (ref 0–30)

## 2021-12-25 LAB — C-REACTIVE PROTEIN: CRP: 0.6 mg/dL (ref <1.0)

## 2021-12-25 MED ORDER — HEPARIN SOD (PORK) LOCK FLUSH 100 UNIT/ML IV SOLN: 250.0000 [IU] | INTRAVENOUS | Status: AC | PRN

## 2021-12-25 MED ORDER — HEPARIN SOD (PORK) LOCK FLUSH 100 UNIT/ML IV SOLN
INTRAVENOUS | Status: AC
  Administered 2021-12-25: 250 [IU]
  Filled 2021-12-25: qty 5

## 2021-12-25 NOTE — Telephone Encounter (Signed)
It's ok to skip tomorrow AM dose and give dose somewhere between 3pm to 5pm. This will only crease the interval by a few hours for only one day. Thanks - Estill Bamberg

## 2021-12-25 NOTE — Progress Notes (Signed)
Patient came in for labs and dressing change for the PICC.  Labs drawn with no issues from the PICC.  Line was flushed with NS and Heparin per protocol.  Dressing was changed.  Site looked good with  no signs of infection.  Set plan for labs and dressing changes the next two weeks. ?

## 2021-12-27 DIAGNOSIS — M00261 Other streptococcal arthritis, right knee: Secondary | ICD-10-CM | POA: Diagnosis not present

## 2021-12-28 DIAGNOSIS — M25561 Pain in right knee: Secondary | ICD-10-CM | POA: Diagnosis not present

## 2021-12-28 DIAGNOSIS — M25661 Stiffness of right knee, not elsewhere classified: Secondary | ICD-10-CM | POA: Diagnosis not present

## 2021-12-28 DIAGNOSIS — M009 Pyogenic arthritis, unspecified: Secondary | ICD-10-CM | POA: Diagnosis not present

## 2022-01-01 ENCOUNTER — Ambulatory Visit
Admission: RE | Admit: 2022-01-01 | Discharge: 2022-01-01 | Disposition: A | Discharge status: Home / Self Care | Payer: PPO | Source: Ambulatory Visit | Attending: Infectious Diseases | Admitting: Infectious Diseases

## 2022-01-01 ENCOUNTER — Inpatient Hospital Stay: Payer: PPO | Admitting: Infectious Diseases

## 2022-01-01 VITALS — BP 127/48 | HR 69 | Resp 18 | Ht 63.0 in | Wt 150.0 lb

## 2022-01-01 DIAGNOSIS — M009 Pyogenic arthritis, unspecified: Secondary | ICD-10-CM | POA: Insufficient documentation

## 2022-01-01 LAB — CBC WITH DIFFERENTIAL/PLATELET
Abs Immature Granulocytes: 0.03 10*3/uL (ref 0.00–0.07)
Basophils Absolute: 0.1 10*3/uL (ref 0.0–0.1)
Basophils Relative: 1 %
Eosinophils Absolute: 0.2 10*3/uL (ref 0.0–0.5)
Eosinophils Relative: 3 %
HCT: 32.2 % — ABNORMAL LOW (ref 36.0–46.0)
Hemoglobin: 9.2 g/dL — ABNORMAL LOW (ref 12.0–15.0)
Immature Granulocytes: 1 %
Lymphocytes Relative: 18 %
Lymphs Abs: 1 10*3/uL (ref 0.7–4.0)
MCH: 19.7 pg — ABNORMAL LOW (ref 26.0–34.0)
MCHC: 28.6 g/dL — ABNORMAL LOW (ref 30.0–36.0)
MCV: 69 fL — ABNORMAL LOW (ref 80.0–100.0)
Monocytes Absolute: 0.5 10*3/uL (ref 0.1–1.0)
Monocytes Relative: 9 %
Neutro Abs: 4 10*3/uL (ref 1.7–7.7)
Neutrophils Relative %: 68 %
Platelets: 303 10*3/uL (ref 150–400)
RBC: 4.67 MIL/uL (ref 3.87–5.11)
RDW: 16.7 % — ABNORMAL HIGH (ref 11.5–15.5)
Smear Review: NORMAL
WBC: 5.8 10*3/uL (ref 4.0–10.5)
nRBC: 0 % (ref 0.0–0.2)

## 2022-01-01 LAB — SEDIMENTATION RATE: Sed Rate: 43 mm/hr — ABNORMAL HIGH (ref 0–30)

## 2022-01-01 LAB — COMPREHENSIVE METABOLIC PANEL
ALT: 12 U/L (ref 0–44)
AST: 14 U/L — ABNORMAL LOW (ref 15–41)
Albumin: 3.9 g/dL (ref 3.5–5.0)
Alkaline Phosphatase: 74 U/L (ref 38–126)
Anion gap: 9 (ref 5–15)
BUN: 16 mg/dL (ref 8–23)
CO2: 26 mmol/L (ref 22–32)
Calcium: 9 mg/dL (ref 8.9–10.3)
Chloride: 101 mmol/L (ref 98–111)
Creatinine, Ser: 0.82 mg/dL (ref 0.44–1.00)
GFR, Estimated: >60 mL/min (ref >60)
Glucose, Bld: 121 mg/dL — ABNORMAL HIGH (ref 70–99)
Potassium: 4 mmol/L (ref 3.5–5.1)
Sodium: 136 mmol/L (ref 135–145)
Total Bilirubin: 1.1 mg/dL (ref 0.3–1.2)
Total Protein: 7.1 g/dL (ref 6.5–8.1)

## 2022-01-01 LAB — C-REACTIVE PROTEIN: CRP: 0.7 mg/dL (ref <1.0)

## 2022-01-01 MED ORDER — ALTEPLASE 2 MG IJ SOLR
2.0000 mg | Freq: Once | INTRAMUSCULAR | Status: AC
  Administered 2022-01-01: 2 mg
  Filled 2022-01-01: qty 2

## 2022-01-01 NOTE — Progress Notes (Addendum)
Patient came in for labs and dressing change for the PICC.  Flushed line, unable to draw labs from PICC; no blood return.  Attempted two times unsuccessfully.  Consulted with IV team for assistance with an intervention ( see note by Latricia Heft, RN) Patient delayed for next appointment due to interval needed for TPA.  Dressing was changed.  Site looked good with  no signs of infection.  Patient to call  to plan for labs and dressing change for next week.

## 2022-01-01 NOTE — Progress Notes (Signed)
Pump flush done with saline- followed by Alteplase for declotting purposes as directed. Pt and RN aware that this must stay instilled for a minimum of 2 hours and a recheck

## 2022-01-01 NOTE — Progress Notes (Signed)
Removed Alteplase ( 2.1 ml) PICC line now has great blood return . Flushed with 10 ml of sterile saline and instilled Heparin to single PICC port . Her RN will change PICC line dressing before patient leaves.

## 2022-01-01 NOTE — Progress Notes (Addendum)
PICC line flushes great - no blood return noted. Repositioned arm and changed cap - still no blood return. Pt tolerated well does not complain of any pain or discomfort. Ordered Alteplase for declotting agent

## 2022-01-03 ENCOUNTER — Ambulatory Visit: Payer: PPO | Attending: Infectious Diseases | Admitting: Infectious Diseases

## 2022-01-03 DIAGNOSIS — M00261 Other streptococcal arthritis, right knee: Secondary | ICD-10-CM | POA: Insufficient documentation

## 2022-01-03 DIAGNOSIS — Z7982 Long term (current) use of aspirin: Secondary | ICD-10-CM | POA: Diagnosis not present

## 2022-01-03 DIAGNOSIS — F418 Other specified anxiety disorders: Secondary | ICD-10-CM | POA: Insufficient documentation

## 2022-01-03 DIAGNOSIS — E785 Hyperlipidemia, unspecified: Secondary | ICD-10-CM | POA: Insufficient documentation

## 2022-01-03 DIAGNOSIS — L4050 Arthropathic psoriasis, unspecified: Secondary | ICD-10-CM | POA: Diagnosis not present

## 2022-01-03 DIAGNOSIS — Z792 Long term (current) use of antibiotics: Secondary | ICD-10-CM | POA: Insufficient documentation

## 2022-01-03 DIAGNOSIS — I10 Essential (primary) hypertension: Secondary | ICD-10-CM | POA: Diagnosis not present

## 2022-01-03 DIAGNOSIS — Z955 Presence of coronary angioplasty implant and graft: Secondary | ICD-10-CM | POA: Insufficient documentation

## 2022-01-03 DIAGNOSIS — I2510 Atherosclerotic heart disease of native coronary artery without angina pectoris: Secondary | ICD-10-CM | POA: Insufficient documentation

## 2022-01-03 DIAGNOSIS — D569 Thalassemia, unspecified: Secondary | ICD-10-CM | POA: Diagnosis not present

## 2022-01-03 DIAGNOSIS — B954 Other streptococcus as the cause of diseases classified elsewhere: Secondary | ICD-10-CM | POA: Diagnosis not present

## 2022-01-03 DIAGNOSIS — B955 Unspecified streptococcus as the cause of diseases classified elsewhere: Secondary | ICD-10-CM | POA: Diagnosis not present

## 2022-01-03 DIAGNOSIS — Z79899 Other long term (current) drug therapy: Secondary | ICD-10-CM | POA: Insufficient documentation

## 2022-01-03 DIAGNOSIS — K219 Gastro-esophageal reflux disease without esophagitis: Secondary | ICD-10-CM | POA: Diagnosis not present

## 2022-01-03 NOTE — Progress Notes (Signed)
NAME: Lydia Elliott  DOB: 03-Sep-1950  MRN: [id number]  Date/Time: 01/03/2022 11:08 AM   Subjective:   ?The purpose of this virtual visit is to provide medical care while limiting exposure to the novel coronavirus (COVID19) for both patient and office staff.   Consent was obtained for video  visit:  Yes.   Answered questions that patient had about telehealth interaction:  Yes.   I discussed the limitations, risks, security and privacy concerns of performing an evaluation and management service by telephone. I also discussed with the patient that there may be a patient responsible charge related to this service. The patient expressed understanding and agreed to proceed.   Patient Location: Home Provider Location: office Lydia Elliott is a 71 y.o. female is here for follow-up after recent hospitalization between 12/16/2021 until 12/20/2021 for right knee septic arthritis . Patient has a history of thalassemia, anemia, CAD, Takotsubo cardiomyopathy, anxiety, depression, Psoriatec arthritis who was started on Humira and prednisone presented with severe pain of the right knee and swelling a few days duration.  As per patient she has had bilateral knee pain for many years and has received multiple intra-articular steroid injections every 6 months.  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  Her last dose of Humira was in February.  Because of persistent right knee pain she was referred to orthopedic surgeon by her rheumatologist and underwent right knee arthroscopy on 10/04/2021.  The findings were tricompartmental chondromalacia with mild synovitis.  Small lateral meniscal tear and chondrocalcinosis was seen.  She underwent partial lateral meniscectomy and limited synovectomy and lateral release.  Following this procedure she had discharge from the medial aspect of the arthroscopic entry point.  This  was persistent in spite of oral doxycycline.  She was taken back for drainage of a subcutaneous abscess on 10/30/2021 and underwent right knee incision and drainage and also arthroscopy and lavage.  Culture was Staph epidermidis and she received 10 days of Cipro.  But since patient was continuing to have some blisters and discharge she presented to the ED on 12/16/2021 with severe right knee pain and swelling.  Her temperature was 99.2 that time WBC was 16.4.  The right knee was aspirated.  WBC was 57,000 with neutrophils of 79%.  Culture was sent and she was started on vancomycin and cefepime.  She was taken to the OR on 12/17/2021 and the tissue culture grew Streptococcus group G.  Antibiotic was de-escalated to ceftriaxone and she was discharged with a PICC line and to complete at least 4 weeks of ceftriaxone.  Patient is very tearful and depressed because its the week of her husband's first death anniversary.  She is not seeing much improvement in the pain.  She has a follow-up appointment with Dr. Mack Guise tomorrow Her labs done recently on 01/01/2022 shows a WBC of 5.8, ESR 43 and CRP of 0.7.  This was decreased from 6.3.  Creatinine was 0.82  Past Medical History:  Diagnosis Date   Anemia    Anxiety    a.) on BZO   Aortic atherosclerosis ([place])    Arthritis    hands, knees, toes   Bone island 10/15/2016   a.) RIGHT humeral head   CAD (coronary artery disease)    a.) 2010 --> PCI with stent placement x 1 (type location unknown)   Cataract    Depression    GERD (gastroesophageal reflux disease)  Hyperlipidemia    Hypertension    IBS (irritable bowel syndrome)    Insomnia    a.) on zolpidem   Nodule of right lung 10/18/2016   Psoriatic arthritis ([place])    Takotsubo cardiomyopathy    Thalassemia    Vertigo    none - 10 yrs   Wears dentures    partial lower    Past Surgical History:  Procedure Laterality Date   APPENDECTOMY  08/12/1965   CARPAL TUNNEL RELEASE  08/12/2005   CATARACT  EXTRACTION  08/13/2011   CHOLECYSTECTOMY  08/12/1998   COLONOSCOPY  03/11/2014   Dr.Wohl   COLONOSCOPY WITH PROPOFOL N/A 12/11/2015   Procedure: COLONOSCOPY WITH PROPOFOL;  Surgeon: Lucilla Lame, MD;  Location: [place];  Service: Endoscopy;  Laterality: N/A;  DESCENDING COLON POLYP    CORONARY ANGIOPLASTY WITH STENT PLACEMENT  08/12/2008   ESOPHAGOGASTRODUODENOSCOPY  02/21/2014   Dr.Wohl   ESOPHAGOGASTRODUODENOSCOPY (EGD) WITH PROPOFOL N/A 12/11/2015   Procedure: ESOPHAGOGASTRODUODENOSCOPY (EGD) WITH PROPOFOL;  Surgeon: Lucilla Lame, MD;  Location: [place];  Service: Endoscopy;  Laterality: N/A;   INCISION AND DRAINAGE Right 12/17/2021   Procedure: INCISION AND DRAINAGE;  Surgeon: Thornton Park, MD;  Location: [facility];  Service: Orthopedics;  Laterality: Right;   KNEE ARTHROSCOPY Right 10/04/2021   Procedure: ARTHROSCOPY KNEE;  Surgeon: Thornton Park, MD;  Location: [facility];  Service: Orthopedics;  Laterality: Right;   KNEE ARTHROSCOPY Right 10/30/2021   Procedure: ARTHROSCOPY KNEE, I&D OF SUPERFICIAL ABSCESS;  Surgeon: Thornton Park, MD;  Location: [facility];  Service: Orthopedics;  Laterality: Right;   POLYPECTOMY N/A 12/11/2015   Procedure: POLYPECTOMY;  Surgeon: Lucilla Lame, MD;  Location: [place];  Service: Endoscopy;  Laterality: N/A;   TONSILLECTOMY     TONSILLECTOMY AND ADENOIDECTOMY  08/13/1967    Social History   Socioeconomic History   Marital status: Widowed    Spouse name: Not on file   Number of children: Not on file   Years of education: Not on file   Highest education level: Not on file  Occupational History   Not on file  Tobacco Use   Smoking status: Former    Packs/day: 0.50    Years: 1.00    Pack years: 0.50    Types: Cigarettes    Quit date: 33    Years since quitting: 51.4   Smokeless tobacco: Never   Tobacco comments:    smoked occasionally  Vaping Use   Vaping Use: Never used  Substance and Sexual Activity    Alcohol use: Yes    Alcohol/week: 1.0 standard drink    Types: 1 Shots of liquor per week    Comment: socially   Drug use: No   Sexual activity: Yes  Other Topics Concern   Not on file  Social History Narrative   Not on file   Social Determinants of Health   Financial Resource Strain: Not on file  Food Insecurity: Not on file  Transportation Needs: Not on file  Physical Activity: Not on file  Stress: Not on file  Social Connections: Not on file  Intimate Partner Violence: Not on file    Family History  Problem Relation Age of Onset   Cirrhosis Mother    Hepatitis Mother    Thalassemia Mother    Diabetes Mother    Heart disease Father    Diabetes Father    Hyperlipidemia Father    Hypertension Father    Stroke Father    Cancer Sister  lung   Diabetes Sister    Stroke Sister        during a surgery   Diabetes Brother    Heart disease Brother    Hypertension Brother    Stroke Brother    Thalassemia Maternal Grandmother    COPD Neg Hx    Allergies  Allergen Reactions   Rosuvastatin     Muscle Pain   I? Current Outpatient Medications  Medication Sig Dispense Refill   Adalimumab 40 MG/0.8ML PSKT Discuss with your Rheumatologist before taking this given current infection 1 each 0   ALPRAZolam (XANAX) 0.25 MG tablet Take 0.25 mg by mouth 2 (two) times daily as needed for anxiety.     aspirin EC 325 MG tablet Take 1 tablet (325 mg total) by mouth daily. 45 tablet 0   atorvastatin (LIPITOR) 20 MG tablet Take 20 mg by mouth daily.     busPIRone (BUSPAR) 5 MG tablet Take 5 mg by mouth daily.     carvedilol (COREG) 3.125 MG tablet Take 3.125 mg by mouth 2 (two) times daily with a meal.     cefTRIAXone (ROCEPHIN) IVPB Inject 2 g into the vein daily for 25 days. Indication:  septic arthritis of knee First Dose: Yes Last Day of Therapy:  01/15/2022 Labs - Once weekly:  CBC/D, CMP, ESR and CRP Please pull PIC at completion of IV antibiotics Fax weekly labs to  [phone] Method of administration: IV Push Method of administration may be changed at the discretion of home infusion pharmacist based upon assessment of the patient and/or caregiver's ability to self-administer the medication ordered. 26 Units 0   diclofenac Sodium (VOLTAREN) 1 % GEL Apply 1 application topically 2 (two) times daily as needed (pain).     docusate sodium (COLACE) 100 MG capsule Take 1 capsule (100 mg total) by mouth daily as needed. 45 capsule 2   escitalopram (LEXAPRO) 20 MG tablet TAKE 1 TABLET BY MOUTH EVERY DAY 90 tablet 3   HYDROcodone-acetaminophen (NORCO) 5-325 MG tablet Take 1-2 tablets by mouth every 6 (six) hours as needed for moderate pain. 20 tablet 0   losartan (COZAAR) 25 MG tablet Take 25 mg by mouth 2 (two) times a day.      omeprazole (PRILOSEC) 20 MG capsule Take 20 mg by mouth daily.     zolpidem (AMBIEN) 10 MG tablet Take 5 mg by mouth at bedtime as needed for sleep.     No current facility-administered medications for this visit.     Abtx:  Anti-infectives (From admission, onward)    None       REVIEW OF SYSTEMS:  Const: negative fever, negative chills, negative weight loss Eyes: negative diplopia or visual changes, negative eye pain ENT: negative coryza, negative sore throat Resp: negative cough, hemoptysis, dyspnea Cards: negative for chest pain, palpitations, lower extremity edema GU: negative for frequency, dysuria and hematuria GI: Negative for abdominal pain, diarrhea, bleeding, constipation Skin: negative for rash and pruritus Heme: negative for easy bruising and gum/nose bleeding MS: rt knee pain Neurolo:negative for headaches, dizziness, vertigo, memory problems  Psych:tearful and  depressed  Endocrine: negative for thyroid, diabetes Allergy/Immunology- crestor ?  Objective:  VITALS:   PHYSICAL EXAM:  General: Alert, cooperative, tearful, Extremities: rt knee surgical site looks well- some swelling of rt  knee  Neurologic: Grossly non-focal Pertinent Labs Lab Results CBC    Component Value Date/Time   WBC 5.8 01/01/2022 1023   RBC 4.67 01/01/2022 1023   HGB 9.2 (  L) 01/01/2022 1023   HGB 9.9 (L) 06/20/2015 0854   HCT 32.2 (L) 01/01/2022 1023   HCT 32.3 (L) 06/20/2015 0854   PLT 303 01/01/2022 1023   PLT 215 06/20/2015 0854   MCV 69.0 (L) 01/01/2022 1023   MCV 67 (L) 06/20/2015 0854   MCH 19.7 (L) 01/01/2022 1023   MCHC 28.6 (L) 01/01/2022 1023   RDW 16.7 (H) 01/01/2022 1023   RDW 15.4 06/20/2015 0854   LYMPHSABS 1.0 01/01/2022 1023   LYMPHSABS 1.1 06/20/2015 0854   MONOABS 0.5 01/01/2022 1023   EOSABS 0.2 01/01/2022 1023   EOSABS 0.1 06/20/2015 0854   BASOSABS 0.1 01/01/2022 1023   BASOSABS 0.1 06/20/2015 0854       Latest Ref Rng & Units 01/01/2022   10:23 AM 12/25/2021    9:06 AM 12/19/2021    3:27 AM  CMP  Glucose 70 - 99 mg/dL 121   107     BUN 8 - 23 mg/dL 16   13     Creatinine 0.44 - 1.00 mg/dL 0.82   0.68   0.66    Sodium 135 - 145 mmol/L 136   137     Potassium 3.5 - 5.1 mmol/L 4.0   3.4     Chloride 98 - 111 mmol/L 101   103     CO2 22 - 32 mmol/L 26   27     Calcium 8.9 - 10.3 mg/dL 9.0   8.7     Total Protein 6.5 - 8.1 g/dL 7.1   6.5     Total Bilirubin 0.3 - 1.2 mg/dL 1.1   1.1     Alkaline Phos 38 - 126 U/L 74   67     AST 15 - 41 U/L 14   22     ALT 0 - 44 U/L 12   24       ? Impression/Recommendation ? 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

## 2022-01-04 DIAGNOSIS — M009 Pyogenic arthritis, unspecified: Secondary | ICD-10-CM | POA: Diagnosis not present

## 2022-01-04 DIAGNOSIS — M25461 Effusion, right knee: Secondary | ICD-10-CM | POA: Diagnosis not present

## 2022-01-08 ENCOUNTER — Telehealth: Payer: Self-pay

## 2022-01-08 ENCOUNTER — Ambulatory Visit
Admission: RE | Admit: 2022-01-08 | Discharge: 2022-01-08 | Disposition: A | Discharge status: Home / Self Care | Payer: PPO | Source: Ambulatory Visit | Attending: Infectious Diseases | Admitting: Infectious Diseases

## 2022-01-08 VITALS — BP 153/52 | HR 69 | Temp 98.0°F | Resp 18

## 2022-01-08 DIAGNOSIS — M009 Pyogenic arthritis, unspecified: Secondary | ICD-10-CM | POA: Insufficient documentation

## 2022-01-08 LAB — CBC WITH DIFFERENTIAL/PLATELET
Abs Immature Granulocytes: 0.01 10*3/uL (ref 0.00–0.07)
Basophils Absolute: 0.1 10*3/uL (ref 0.0–0.1)
Basophils Relative: 2 %
Eosinophils Absolute: 0.2 10*3/uL (ref 0.0–0.5)
Eosinophils Relative: 4 %
HCT: 30.6 % — ABNORMAL LOW (ref 36.0–46.0)
Hemoglobin: 9.1 g/dL — ABNORMAL LOW (ref 12.0–15.0)
Immature Granulocytes: 0 %
Lymphocytes Relative: 20 %
Lymphs Abs: 0.8 10*3/uL (ref 0.7–4.0)
MCH: 20.1 pg — ABNORMAL LOW (ref 26.0–34.0)
MCHC: 29.7 g/dL — ABNORMAL LOW (ref 30.0–36.0)
MCV: 67.7 fL — ABNORMAL LOW (ref 80.0–100.0)
Monocytes Absolute: 0.4 10*3/uL (ref 0.1–1.0)
Monocytes Relative: 9 %
Neutro Abs: 2.6 10*3/uL (ref 1.7–7.7)
Neutrophils Relative %: 65 %
Platelets: 233 10*3/uL (ref 150–400)
RBC: 4.52 MIL/uL (ref 3.87–5.11)
RDW: 16 % — ABNORMAL HIGH (ref 11.5–15.5)
Smear Review: NORMAL
WBC: 4 10*3/uL (ref 4.0–10.5)
nRBC: 0 % (ref 0.0–0.2)

## 2022-01-08 LAB — COMPREHENSIVE METABOLIC PANEL
ALT: 11 U/L (ref 0–44)
AST: 14 U/L — ABNORMAL LOW (ref 15–41)
Albumin: 3.7 g/dL (ref 3.5–5.0)
Alkaline Phosphatase: 74 U/L (ref 38–126)
Anion gap: 9 (ref 5–15)
BUN: 12 mg/dL (ref 8–23)
CO2: 26 mmol/L (ref 22–32)
Calcium: 9.3 mg/dL (ref 8.9–10.3)
Chloride: 103 mmol/L (ref 98–111)
Creatinine, Ser: 0.6 mg/dL (ref 0.44–1.00)
GFR, Estimated: >60 mL/min (ref >60)
Glucose, Bld: 117 mg/dL — ABNORMAL HIGH (ref 70–99)
Potassium: 3.7 mmol/L (ref 3.5–5.1)
Sodium: 138 mmol/L (ref 135–145)
Total Bilirubin: 0.8 mg/dL (ref 0.3–1.2)
Total Protein: 6.8 g/dL (ref 6.5–8.1)

## 2022-01-08 LAB — SEDIMENTATION RATE: Sed Rate: 46 mm/hr — ABNORMAL HIGH (ref 0–30)

## 2022-01-08 LAB — C-REACTIVE PROTEIN: CRP: 0.6 mg/dL (ref <1.0)

## 2022-01-08 MED ORDER — SODIUM CHLORIDE 0.9% FLUSH
10.0000 mL | Freq: Once | INTRAVENOUS | Status: AC
  Administered 2022-01-08: 10 mL via INTRAVENOUS

## 2022-01-08 MED ORDER — HEPARIN SOD (PORK) LOCK FLUSH 100 UNIT/ML IV SOLN
INTRAVENOUS | Status: AC
  Administered 2022-01-08: 250 [IU]
  Filled 2022-01-08: qty 5

## 2022-01-08 MED ORDER — HEPARIN SOD (PORK) LOCK FLUSH 100 UNIT/ML IV SOLN: 250.0000 [IU] | INTRAVENOUS | Status: AC | PRN

## 2022-01-08 NOTE — Telephone Encounter (Signed)
-----  Message from Tsosie Billing, MD sent at 01/08/2022  2:35 PM EDT ----- Please let her know that her labs are okay- The ESR is mildly elevated and it could be due to psoriasis and not necessarily infection in the knee- The ortho docotr took some fluid from the knee and still waiting on the results. She will finish IV antibiotic on 6/6. Will decide whether she needs oral antibiotic aftr that depending on the fluid test result- thx ----- Message ----- From: Interface, Lab In [place] Sent: 01/08/2022   9:54 AM EDT To: Tsosie Billing, MD

## 2022-01-08 NOTE — Telephone Encounter (Signed)
Patient advised of lab results and advised that we will discuss antibiotic treatment after we received results for fluid test results. Patient verbalized understanding.

## 2022-01-11 DIAGNOSIS — M009 Pyogenic arthritis, unspecified: Secondary | ICD-10-CM | POA: Diagnosis not present

## 2022-01-15 ENCOUNTER — Other Ambulatory Visit: Payer: PPO

## 2022-01-15 DIAGNOSIS — M25561 Pain in right knee: Secondary | ICD-10-CM | POA: Diagnosis not present

## 2022-01-15 DIAGNOSIS — M25661 Stiffness of right knee, not elsewhere classified: Secondary | ICD-10-CM | POA: Diagnosis not present

## 2022-01-16 ENCOUNTER — Ambulatory Visit
Admission: RE | Admit: 2022-01-16 | Discharge: 2022-01-16 | Disposition: A | Discharge status: Home / Self Care | Payer: PPO | Source: Ambulatory Visit | Attending: Infectious Diseases | Admitting: Infectious Diseases

## 2022-01-16 ENCOUNTER — Other Ambulatory Visit: Payer: Self-pay

## 2022-01-16 DIAGNOSIS — Z01812 Encounter for preprocedural laboratory examination: Secondary | ICD-10-CM | POA: Diagnosis not present

## 2022-01-16 DIAGNOSIS — M009 Pyogenic arthritis, unspecified: Secondary | ICD-10-CM | POA: Insufficient documentation

## 2022-01-16 LAB — CBC WITH DIFFERENTIAL/PLATELET
Abs Immature Granulocytes: 0.01 10*3/uL (ref 0.00–0.07)
Basophils Absolute: 0.1 10*3/uL (ref 0.0–0.1)
Basophils Relative: 2 %
Eosinophils Absolute: 0.1 10*3/uL (ref 0.0–0.5)
Eosinophils Relative: 3 %
HCT: 30.3 % — ABNORMAL LOW (ref 36.0–46.0)
Hemoglobin: 9 g/dL — ABNORMAL LOW (ref 12.0–15.0)
Immature Granulocytes: 0 %
Lymphocytes Relative: 19 %
Lymphs Abs: 0.8 10*3/uL (ref 0.7–4.0)
MCH: 19.9 pg — ABNORMAL LOW (ref 26.0–34.0)
MCHC: 29.7 g/dL — ABNORMAL LOW (ref 30.0–36.0)
MCV: 66.9 fL — ABNORMAL LOW (ref 80.0–100.0)
Monocytes Absolute: 0.5 10*3/uL (ref 0.1–1.0)
Monocytes Relative: 11 %
Neutro Abs: 2.6 10*3/uL (ref 1.7–7.7)
Neutrophils Relative %: 65 %
Platelets: 188 10*3/uL (ref 150–400)
RBC: 4.53 MIL/uL (ref 3.87–5.11)
RDW: 16.3 % — ABNORMAL HIGH (ref 11.5–15.5)
Smear Review: NORMAL
WBC: 4.1 10*3/uL (ref 4.0–10.5)
nRBC: 0 % (ref 0.0–0.2)

## 2022-01-16 LAB — COMPREHENSIVE METABOLIC PANEL
ALT: 13 U/L (ref 0–44)
AST: 18 U/L (ref 15–41)
Albumin: 3.7 g/dL (ref 3.5–5.0)
Alkaline Phosphatase: 71 U/L (ref 38–126)
Anion gap: 4 — ABNORMAL LOW (ref 5–15)
BUN: 15 mg/dL (ref 8–23)
CO2: 26 mmol/L (ref 22–32)
Calcium: 9 mg/dL (ref 8.9–10.3)
Chloride: 108 mmol/L (ref 98–111)
Creatinine, Ser: 0.7 mg/dL (ref 0.44–1.00)
GFR, Estimated: >60 mL/min (ref >60)
Glucose, Bld: 114 mg/dL — ABNORMAL HIGH (ref 70–99)
Potassium: 3.8 mmol/L (ref 3.5–5.1)
Sodium: 138 mmol/L (ref 135–145)
Total Bilirubin: 0.6 mg/dL (ref 0.3–1.2)
Total Protein: 6.8 g/dL (ref 6.5–8.1)

## 2022-01-16 LAB — SEDIMENTATION RATE: Sed Rate: 29 mm/hr (ref 0–30)

## 2022-01-16 LAB — FUNGUS CULTURE WITH STAIN

## 2022-01-16 LAB — FUNGAL ORGANISM REFLEX

## 2022-01-16 LAB — C-REACTIVE PROTEIN: CRP: 0.5 mg/dL (ref <1.0)

## 2022-01-16 LAB — FUNGUS CULTURE RESULT

## 2022-01-16 NOTE — Progress Notes (Signed)
Patient arrived to have labs drawn and  her PICC line removed.  PICC line was removed by myself without difficulty.  PICC was cut at the correct mark of 35cm.  Occlusive dressing was placed and pressure was held for 5 minutes.  Patient to be monitored for 30 minutes then will be able to leave. Patient waited the 30 minutes with no problems or concerns.  Vital signs were stable upon discharge.

## 2022-01-17 ENCOUNTER — Other Ambulatory Visit: Payer: Self-pay | Admitting: Infectious Diseases

## 2022-01-17 LAB — FUNGUS CULTURE WITH STAIN

## 2022-01-17 LAB — FUNGAL ORGANISM REFLEX

## 2022-01-17 LAB — FUNGUS CULTURE RESULT

## 2022-01-17 MED ORDER — AMOXICILLIN 500 MG PO TABS: 500.0000 mg | ORAL_TABLET | Freq: Two times a day (BID) | ORAL | 0 refills | Status: DC

## 2022-01-17 NOTE — Progress Notes (Signed)
Spoke to the patient and shared the CRP and esr which were normal She saw Dr.Krasinski and had fluid taken from the rt knee - no labs will available She completed IV ceftriaxone on 01/15/22 and PICC removed- she has pain Will give amoxicillin 526m Po TID for the streptococcus Group G septic arthritis of the rt knee until the synovial fluid tests are back

## 2022-01-21 DIAGNOSIS — M25569 Pain in unspecified knee: Secondary | ICD-10-CM | POA: Diagnosis not present

## 2022-01-21 DIAGNOSIS — M1711 Unilateral primary osteoarthritis, right knee: Secondary | ICD-10-CM | POA: Diagnosis not present

## 2022-01-21 DIAGNOSIS — M25461 Effusion, right knee: Secondary | ICD-10-CM | POA: Diagnosis not present

## 2022-01-23 DIAGNOSIS — M25661 Stiffness of right knee, not elsewhere classified: Secondary | ICD-10-CM | POA: Diagnosis not present

## 2022-01-23 DIAGNOSIS — M25561 Pain in right knee: Secondary | ICD-10-CM | POA: Diagnosis not present

## 2022-01-30 DIAGNOSIS — M25661 Stiffness of right knee, not elsewhere classified: Secondary | ICD-10-CM | POA: Diagnosis not present

## 2022-01-30 DIAGNOSIS — M25561 Pain in right knee: Secondary | ICD-10-CM | POA: Diagnosis not present

## 2022-01-30 LAB — ACID FAST CULTURE WITH REFLEXED SENSITIVITIES (MYCOBACTERIA)
Acid Fast Culture: NEGATIVE
Acid Fast Culture: NEGATIVE

## 2022-01-31 LAB — ACID FAST CULTURE WITH REFLEXED SENSITIVITIES (MYCOBACTERIA): Acid Fast Culture: NEGATIVE

## 2022-02-01 DIAGNOSIS — M25661 Stiffness of right knee, not elsewhere classified: Secondary | ICD-10-CM | POA: Diagnosis not present

## 2022-02-01 DIAGNOSIS — M25561 Pain in right knee: Secondary | ICD-10-CM | POA: Diagnosis not present

## 2022-02-04 ENCOUNTER — Encounter: Payer: Self-pay | Admitting: Infectious Diseases

## 2022-02-04 DIAGNOSIS — M25561 Pain in right knee: Secondary | ICD-10-CM | POA: Diagnosis not present

## 2022-02-22 ENCOUNTER — Telehealth: Payer: Self-pay

## 2022-02-22 NOTE — Telephone Encounter (Signed)
Called patient to follow up on unread MyChart message from Dr. Delaine Lame regarding restarting prednisone. No answer. Left HIPAA-compliant voicemail encouraging patient to call back with any additional questions. Will also send MyChart message.  Binnie Kand, RN

## 2022-02-25 DIAGNOSIS — M170 Bilateral primary osteoarthritis of knee: Secondary | ICD-10-CM | POA: Diagnosis not present

## 2022-03-08 DIAGNOSIS — M00261 Other streptococcal arthritis, right knee: Secondary | ICD-10-CM | POA: Diagnosis not present

## 2022-03-08 DIAGNOSIS — E538 Deficiency of other specified B group vitamins: Secondary | ICD-10-CM | POA: Diagnosis not present

## 2022-03-08 DIAGNOSIS — E782 Mixed hyperlipidemia: Secondary | ICD-10-CM | POA: Diagnosis not present

## 2022-03-08 DIAGNOSIS — I10 Essential (primary) hypertension: Secondary | ICD-10-CM | POA: Diagnosis not present

## 2022-03-14 DIAGNOSIS — F411 Generalized anxiety disorder: Secondary | ICD-10-CM | POA: Diagnosis not present

## 2022-03-14 DIAGNOSIS — M00261 Other streptococcal arthritis, right knee: Secondary | ICD-10-CM | POA: Diagnosis not present

## 2022-03-14 DIAGNOSIS — F5104 Psychophysiologic insomnia: Secondary | ICD-10-CM | POA: Diagnosis not present

## 2022-03-14 DIAGNOSIS — Z780 Asymptomatic menopausal state: Secondary | ICD-10-CM | POA: Diagnosis not present

## 2022-03-14 DIAGNOSIS — L4050 Arthropathic psoriasis, unspecified: Secondary | ICD-10-CM | POA: Diagnosis not present

## 2022-03-14 DIAGNOSIS — E782 Mixed hyperlipidemia: Secondary | ICD-10-CM | POA: Diagnosis not present

## 2022-03-14 DIAGNOSIS — I10 Essential (primary) hypertension: Secondary | ICD-10-CM | POA: Diagnosis not present

## 2022-04-09 DIAGNOSIS — M25561 Pain in right knee: Secondary | ICD-10-CM | POA: Diagnosis not present

## 2022-04-09 DIAGNOSIS — M25562 Pain in left knee: Secondary | ICD-10-CM | POA: Diagnosis not present

## 2022-04-09 DIAGNOSIS — R262 Difficulty in walking, not elsewhere classified: Secondary | ICD-10-CM | POA: Diagnosis not present

## 2022-04-10 DIAGNOSIS — M810 Age-related osteoporosis without current pathological fracture: Secondary | ICD-10-CM | POA: Diagnosis not present

## 2022-04-10 DIAGNOSIS — L4050 Arthropathic psoriasis, unspecified: Secondary | ICD-10-CM | POA: Diagnosis not present

## 2022-04-10 DIAGNOSIS — M1711 Unilateral primary osteoarthritis, right knee: Secondary | ICD-10-CM | POA: Diagnosis not present

## 2022-04-16 DIAGNOSIS — M25561 Pain in right knee: Secondary | ICD-10-CM | POA: Diagnosis not present

## 2022-04-16 DIAGNOSIS — R262 Difficulty in walking, not elsewhere classified: Secondary | ICD-10-CM | POA: Diagnosis not present

## 2022-04-16 DIAGNOSIS — M25562 Pain in left knee: Secondary | ICD-10-CM | POA: Diagnosis not present

## 2022-04-18 DIAGNOSIS — R262 Difficulty in walking, not elsewhere classified: Secondary | ICD-10-CM | POA: Diagnosis not present

## 2022-04-18 DIAGNOSIS — M25561 Pain in right knee: Secondary | ICD-10-CM | POA: Diagnosis not present

## 2022-04-18 DIAGNOSIS — M25562 Pain in left knee: Secondary | ICD-10-CM | POA: Diagnosis not present

## 2022-04-23 DIAGNOSIS — M25561 Pain in right knee: Secondary | ICD-10-CM | POA: Diagnosis not present

## 2022-04-23 DIAGNOSIS — R262 Difficulty in walking, not elsewhere classified: Secondary | ICD-10-CM | POA: Diagnosis not present

## 2022-04-23 DIAGNOSIS — M25562 Pain in left knee: Secondary | ICD-10-CM | POA: Diagnosis not present

## 2022-04-26 DIAGNOSIS — M25561 Pain in right knee: Secondary | ICD-10-CM | POA: Diagnosis not present

## 2022-04-26 DIAGNOSIS — R262 Difficulty in walking, not elsewhere classified: Secondary | ICD-10-CM | POA: Diagnosis not present

## 2022-04-26 DIAGNOSIS — M25562 Pain in left knee: Secondary | ICD-10-CM | POA: Diagnosis not present

## 2022-04-30 DIAGNOSIS — M25562 Pain in left knee: Secondary | ICD-10-CM | POA: Diagnosis not present

## 2022-04-30 DIAGNOSIS — R262 Difficulty in walking, not elsewhere classified: Secondary | ICD-10-CM | POA: Diagnosis not present

## 2022-04-30 DIAGNOSIS — M25561 Pain in right knee: Secondary | ICD-10-CM | POA: Diagnosis not present

## 2022-05-03 DIAGNOSIS — M25562 Pain in left knee: Secondary | ICD-10-CM | POA: Diagnosis not present

## 2022-05-03 DIAGNOSIS — R262 Difficulty in walking, not elsewhere classified: Secondary | ICD-10-CM | POA: Diagnosis not present

## 2022-05-03 DIAGNOSIS — M25561 Pain in right knee: Secondary | ICD-10-CM | POA: Diagnosis not present

## 2022-05-07 DIAGNOSIS — M25562 Pain in left knee: Secondary | ICD-10-CM | POA: Diagnosis not present

## 2022-05-07 DIAGNOSIS — R262 Difficulty in walking, not elsewhere classified: Secondary | ICD-10-CM | POA: Diagnosis not present

## 2022-05-07 DIAGNOSIS — M25561 Pain in right knee: Secondary | ICD-10-CM | POA: Diagnosis not present

## 2022-05-14 DIAGNOSIS — I10 Essential (primary) hypertension: Secondary | ICD-10-CM | POA: Diagnosis not present

## 2022-05-14 DIAGNOSIS — I5181 Takotsubo syndrome: Secondary | ICD-10-CM | POA: Diagnosis not present

## 2022-05-14 DIAGNOSIS — I2510 Atherosclerotic heart disease of native coronary artery without angina pectoris: Secondary | ICD-10-CM | POA: Diagnosis not present

## 2022-05-14 DIAGNOSIS — M25562 Pain in left knee: Secondary | ICD-10-CM | POA: Diagnosis not present

## 2022-05-14 DIAGNOSIS — I38 Endocarditis, valve unspecified: Secondary | ICD-10-CM | POA: Diagnosis not present

## 2022-05-14 DIAGNOSIS — R262 Difficulty in walking, not elsewhere classified: Secondary | ICD-10-CM | POA: Diagnosis not present

## 2022-05-14 DIAGNOSIS — M25561 Pain in right knee: Secondary | ICD-10-CM | POA: Diagnosis not present

## 2022-05-14 DIAGNOSIS — E782 Mixed hyperlipidemia: Secondary | ICD-10-CM | POA: Diagnosis not present

## 2022-05-21 DIAGNOSIS — M25562 Pain in left knee: Secondary | ICD-10-CM | POA: Diagnosis not present

## 2022-05-21 DIAGNOSIS — R262 Difficulty in walking, not elsewhere classified: Secondary | ICD-10-CM | POA: Diagnosis not present

## 2022-05-21 DIAGNOSIS — M25561 Pain in right knee: Secondary | ICD-10-CM | POA: Diagnosis not present

## 2022-05-26 ENCOUNTER — Encounter: Payer: Self-pay | Admitting: Emergency Medicine

## 2022-05-26 ENCOUNTER — Ambulatory Visit
Admission: EM | Admit: 2022-05-26 | Discharge: 2022-05-26 | Disposition: A | Discharge status: Home / Self Care | Payer: PPO | Attending: Emergency Medicine | Admitting: Emergency Medicine

## 2022-05-26 DIAGNOSIS — M546 Pain in thoracic spine: Secondary | ICD-10-CM

## 2022-05-26 MED ORDER — KETOROLAC TROMETHAMINE 60 MG/2ML IM SOLN
30.0000 mg | Freq: Once | INTRAMUSCULAR | Status: AC
  Administered 2022-05-26: 30 mg via INTRAMUSCULAR

## 2022-05-26 NOTE — ED Provider Notes (Signed)
[facility]    CSN: [id number] Arrival date & time: 05/26/22  1014      History   Chief Complaint Chief Complaint  Patient presents with   Cough   Back Pain    HPI Lydia Elliott is a 71 y.o. female.   Patient presents with pain to the center of the back beginning 1 day ago.  It can be felt with deep breathing and with twisting and turning.  Denies injury or trauma.  Has not attempted treatment.  Endorses a mild cough that began 3 to 4 days ago but is not concerned with this today.  Denies shortness of breath, wheezing, numbness, tingling, urinary or bowel incontinence.   Past Medical History:  Diagnosis Date   Anemia    Anxiety    a.) on BZO   Aortic atherosclerosis ([place])    Arthritis    hands, knees, toes   Bone island 10/15/2016   a.) RIGHT humeral head   CAD (coronary artery disease)    a.) 2010 --> PCI with stent placement x 1 (type location unknown)   Cataract    Depression    GERD (gastroesophageal reflux disease)    Hyperlipidemia    Hypertension    IBS (irritable bowel syndrome)    Insomnia    a.) on zolpidem   Nodule of right lung 10/18/2016   Psoriatic arthritis ([place])    Takotsubo cardiomyopathy    Thalassemia    Vertigo    none - 10 yrs   Wears dentures    partial lower    Patient Active Problem List   Diagnosis Date Noted   Septic joint of right knee joint ([place]) 12/16/2021   Depression with anxiety 12/16/2021   Aortic atherosclerosis ([place]) 10/18/2016   Nodule of right lung 10/18/2016   GERD (gastroesophageal reflux disease) 04/24/2016   Anxiety about health 12/23/2015   Overweight (BMI 25.0-29.9) 12/23/2015   Benign neoplasm of descending colon    Impaired fasting glucose 07/03/2015   Anemia 07/03/2015   Takotsubo cardiomyopathy 06/18/2015   Hypertension    Hyperlipidemia    Insomnia    Polyarthralgia    CAD (coronary artery disease)    Thalassemia    Heart disease     Past Surgical History:  Procedure Laterality  Date   APPENDECTOMY  08/12/1965   CARPAL TUNNEL RELEASE  08/12/2005   CATARACT EXTRACTION  08/13/2011   CHOLECYSTECTOMY  08/12/1998   COLONOSCOPY  03/11/2014   Dr.Wohl   COLONOSCOPY WITH PROPOFOL N/A 12/11/2015   Procedure: COLONOSCOPY WITH PROPOFOL;  Surgeon: Lucilla Lame, MD;  Location: [place];  Service: Endoscopy;  Laterality: N/A;  DESCENDING COLON POLYP    CORONARY ANGIOPLASTY WITH STENT PLACEMENT  08/12/2008   ESOPHAGOGASTRODUODENOSCOPY  02/21/2014   Dr.Wohl   ESOPHAGOGASTRODUODENOSCOPY (EGD) WITH PROPOFOL N/A 12/11/2015   Procedure: ESOPHAGOGASTRODUODENOSCOPY (EGD) WITH PROPOFOL;  Surgeon: Lucilla Lame, MD;  Location: [place];  Service: Endoscopy;  Laterality: N/A;   INCISION AND DRAINAGE Right 12/17/2021   Procedure: INCISION AND DRAINAGE;  Surgeon: Thornton Park, MD;  Location: [facility];  Service: Orthopedics;  Laterality: Right;   KNEE ARTHROSCOPY Right 10/04/2021   Procedure: ARTHROSCOPY KNEE;  Surgeon: Thornton Park, MD;  Location: [facility];  Service: Orthopedics;  Laterality: Right;   KNEE ARTHROSCOPY Right 10/30/2021   Procedure: ARTHROSCOPY KNEE, I&D OF SUPERFICIAL ABSCESS;  Surgeon: Thornton Park, MD;  Location: [facility];  Service: Orthopedics;  Laterality: Right;   POLYPECTOMY N/A 12/11/2015   Procedure: POLYPECTOMY;  Surgeon: Lucilla Lame, MD;  Location: [place];  Service: Endoscopy;  Laterality: N/A;   TONSILLECTOMY     TONSILLECTOMY AND ADENOIDECTOMY  08/13/1967    OB History   No obstetric history on file.      Home Medications    Prior to Admission medications   Medication Sig Start Date End Date Taking? Authorizing Provider  ALPRAZolam (XANAX) 0.25 MG tablet Take 0.25 mg by mouth 2 (two) times daily as needed for anxiety.   Yes [provider]  atorvastatin (LIPITOR) 20 MG tablet Take 20 mg by mouth daily. 09/05/21  Yes [provider]  busPIRone (BUSPAR) 5 MG tablet Take 5 mg by mouth daily. 01/25/20   Yes [provider]  carvedilol (COREG) 3.125 MG tablet Take 3.125 mg by mouth 2 (two) times daily with a meal. 03/15/16  Yes [provider]  diclofenac Sodium (VOLTAREN) 1 % GEL Apply 1 application topically 2 (two) times daily as needed (pain).   Yes [provider]  losartan (COZAAR) 25 MG tablet Take 25 mg by mouth 2 (two) times a day.  08/03/18  Yes [provider]  omeprazole (PRILOSEC) 20 MG capsule Take 20 mg by mouth daily.   Yes [provider]  zolpidem (AMBIEN) 10 MG tablet Take 5 mg by mouth at bedtime as needed for sleep. 05/19/15  Yes [provider]  Adalimumab 40 MG/0.8ML PSKT Discuss with your Rheumatologist before taking this given current infection 12/20/21   Fritzi Mandes, MD  amoxicillin (AMOXIL) 500 MG tablet Take 1 tablet (500 mg total) by mouth 2 (two) times daily. 01/17/22   Tsosie Billing, MD  aspirin EC 325 MG tablet Take 1 tablet (325 mg total) by mouth daily. 10/04/21   Thornton Park, MD  docusate sodium (COLACE) 100 MG capsule Take 1 capsule (100 mg total) by mouth daily as needed. 10/04/21 10/04/22  Thornton Park, MD  escitalopram (LEXAPRO) 20 MG tablet TAKE 1 TABLET BY MOUTH EVERY DAY 11/10/19   Delsa Grana, PA-C  HYDROcodone-acetaminophen (NORCO) 5-325 MG tablet Take 1-2 tablets by mouth every 6 (six) hours as needed for moderate pain. 12/20/21   Fritzi Mandes, MD    Family History Family History  Problem Relation Age of Onset   Cirrhosis Mother    Hepatitis Mother    Thalassemia Mother    Diabetes Mother    Heart disease Father    Diabetes Father    Hyperlipidemia Father    Hypertension Father    Stroke Father    Cancer Sister        lung   Diabetes Sister    Stroke Sister        during a surgery   Diabetes Brother    Heart disease Brother    Hypertension Brother    Stroke Brother    Thalassemia Maternal Grandmother    COPD Neg Hx     Social History Social History   Tobacco Use    Smoking status: Former    Packs/day: 0.50    Years: 1.00    Total pack years: 0.50    Types: Cigarettes    Quit date: 1972    Years since quitting: 51.8   Smokeless tobacco: Never   Tobacco comments:    smoked occasionally  Vaping Use   Vaping Use: Never used  Substance Use Topics   Alcohol use: Yes    Alcohol/week: 1.0 standard drink of alcohol    Types: 1 Shots of liquor per week  Comment: socially   Drug use: No     Allergies   Rosuvastatin   Review of Systems Review of Systems  Constitutional: Negative.   HENT: Negative.    Respiratory:  Positive for cough. Negative for apnea, choking, chest tightness, shortness of breath, wheezing and stridor.   Cardiovascular: Negative.   Gastrointestinal: Negative.   Musculoskeletal:  Positive for back pain. Negative for arthralgias, gait problem, joint swelling, myalgias, neck pain and neck stiffness.  Skin: Negative.      Physical Exam Triage Vital Signs ED Triage Vitals  Enc Vitals Group     BP 05/26/22 1128 (!) 172/84     Pulse Rate 05/26/22 1128 72     Resp 05/26/22 1128 16     Temp 05/26/22 1128 98.1 F (36.7 C)     Temp Source 05/26/22 1128 Oral     SpO2 05/26/22 1128 97 %     Weight 05/26/22 1126 147 lb 14.9 oz (67.1 kg)     Height 05/26/22 1126 '5\' 3"'$  (1.6 m)     Head Circumference --      Peak Flow --      Pain Score 05/26/22 1125 4     Pain Loc --      Pain Edu? --      Excl. in [place]? --    No data found.  Updated Vital Signs BP (!) 172/84 (BP Location: Right Arm)   Pulse 72   Temp 98.1 F (36.7 C) (Oral)   Resp 16   Ht '5\' 3"'$  (1.6 m)   Wt 147 lb 14.9 oz (67.1 kg)   SpO2 97%   BMI 26.20 kg/m   Visual Acuity Right Eye Distance:   Left Eye Distance:   Bilateral Distance:    Right Eye Near:   Left Eye Near:    Bilateral Near:     Physical Exam Constitutional:      Appearance: Normal appearance.  Eyes:     Extraocular Movements: Extraocular movements intact.  Cardiovascular:     Rate  and Rhythm: Normal rate and regular rhythm.     Pulses: Normal pulses.     Heart sounds: Normal heart sounds.  Pulmonary:     Effort: Pulmonary effort is normal.     Breath sounds: Normal breath sounds.  Musculoskeletal:     Comments: Spinal tenderness present to the thoracic region without ecchymosis, swelling or deformity, range of motion is intact, negative straight leg test  Neurological:     Mental Status: She is alert and oriented to person, place, and time. Mental status is at baseline.  Psychiatric:        Mood and Affect: Mood normal.        Behavior: Behavior normal.      UC Treatments / Results  Labs (all labs ordered are listed, but only abnormal results are displayed) Labs Reviewed - No data to display  EKG   Radiology No results found.  Procedures Procedures (including critical care time)  Medications Ordered in UC Medications - No data to display  Initial Impression / Assessment and Plan / UC Course  I have reviewed the triage vital signs and the nursing notes.  Pertinent labs & imaging results that were available during my care of the patient were reviewed by me and considered in my medical decision making (see chart for details).  Acute midline thoracic back pain  Etiology is most likely muscular, lungs are clear to auscultation, O2 saturation 97% on room air and  patient is nontoxic-appearing nor in respiratory distress, low suspicion of lung involvement at this time, discussed with patient, Toradol injection given in office and recommended over-the-counter analgesics and RICE for outpatient management with follow-up with PCP if symptoms persist or worsen Final Clinical Impressions(s) / UC Diagnoses   Final diagnoses:  None   Discharge Instructions   None    ED Prescriptions   None    PDMP not reviewed this encounter.   Hans Eden, NP 05/26/22 1150

## 2022-05-26 NOTE — Discharge Instructions (Addendum)
On exam today your lungs are clear and you are getting enough air without assistance, I am able to cause your pain by pressing on your back which gives reassurance that this is muscular pain and not related to your lungs or heart  You have been given an injection of Toradol today in the office to help reduce inflammation that occurs with pain which in turn will reduce your symptoms  You may apply heating pad over the affected area in 10 to 15-minute intervals  May place a pillow behind your back  You may stretch as tolerated  You may take 600 mg (3 tablets) of ibuprofen (Advil) every 6-8 hours as needed for comfort, may take Tylenol in addition to this  If your symptoms continue to persist or worsen you may follow-up with urgent care or your primary doctor for reevaluation

## 2022-05-26 NOTE — ED Triage Notes (Signed)
Pt c/o cough, thoracic back pain. Started last night. She states she got the flu and RSV vaccines yesterday. No known injury.

## 2022-05-28 DIAGNOSIS — M25562 Pain in left knee: Secondary | ICD-10-CM | POA: Diagnosis not present

## 2022-05-28 DIAGNOSIS — M25561 Pain in right knee: Secondary | ICD-10-CM | POA: Diagnosis not present

## 2022-05-28 DIAGNOSIS — R262 Difficulty in walking, not elsewhere classified: Secondary | ICD-10-CM | POA: Diagnosis not present

## 2022-06-04 DIAGNOSIS — M25562 Pain in left knee: Secondary | ICD-10-CM | POA: Diagnosis not present

## 2022-06-04 DIAGNOSIS — R262 Difficulty in walking, not elsewhere classified: Secondary | ICD-10-CM | POA: Diagnosis not present

## 2022-06-04 DIAGNOSIS — M25561 Pain in right knee: Secondary | ICD-10-CM | POA: Diagnosis not present

## 2022-07-03 DIAGNOSIS — F411 Generalized anxiety disorder: Secondary | ICD-10-CM | POA: Diagnosis not present

## 2022-07-26 DIAGNOSIS — M25569 Pain in unspecified knee: Secondary | ICD-10-CM | POA: Diagnosis not present

## 2022-07-26 DIAGNOSIS — M25561 Pain in right knee: Secondary | ICD-10-CM | POA: Diagnosis not present

## 2022-07-26 DIAGNOSIS — M170 Bilateral primary osteoarthritis of knee: Secondary | ICD-10-CM | POA: Diagnosis not present

## 2022-09-19 DIAGNOSIS — I10 Essential (primary) hypertension: Secondary | ICD-10-CM | POA: Diagnosis not present

## 2022-09-19 DIAGNOSIS — F411 Generalized anxiety disorder: Secondary | ICD-10-CM | POA: Diagnosis not present

## 2022-09-19 DIAGNOSIS — I2510 Atherosclerotic heart disease of native coronary artery without angina pectoris: Secondary | ICD-10-CM | POA: Diagnosis not present

## 2022-09-19 DIAGNOSIS — F32A Depression, unspecified: Secondary | ICD-10-CM | POA: Diagnosis not present

## 2022-09-19 DIAGNOSIS — M00261 Other streptococcal arthritis, right knee: Secondary | ICD-10-CM | POA: Diagnosis not present

## 2022-09-19 DIAGNOSIS — E782 Mixed hyperlipidemia: Secondary | ICD-10-CM | POA: Diagnosis not present

## 2022-09-19 DIAGNOSIS — L4050 Arthropathic psoriasis, unspecified: Secondary | ICD-10-CM | POA: Diagnosis not present

## 2022-10-29 DIAGNOSIS — S81812A Laceration without foreign body, left lower leg, initial encounter: Secondary | ICD-10-CM | POA: Diagnosis not present

## 2022-10-30 ENCOUNTER — Emergency Department: Payer: PPO

## 2022-10-30 ENCOUNTER — Emergency Department
Admission: EM | Admit: 2022-10-30 | Discharge: 2022-10-30 | Disposition: A | Discharge status: Home / Self Care | Payer: PPO | Attending: Emergency Medicine | Admitting: Emergency Medicine

## 2022-10-30 DIAGNOSIS — Y92410 Unspecified street and highway as the place of occurrence of the external cause: Secondary | ICD-10-CM | POA: Insufficient documentation

## 2022-10-30 DIAGNOSIS — S81012A Laceration without foreign body, left knee, initial encounter: Secondary | ICD-10-CM | POA: Diagnosis not present

## 2022-10-30 DIAGNOSIS — Z23 Encounter for immunization: Secondary | ICD-10-CM | POA: Diagnosis not present

## 2022-10-30 DIAGNOSIS — I2510 Atherosclerotic heart disease of native coronary artery without angina pectoris: Secondary | ICD-10-CM | POA: Diagnosis not present

## 2022-10-30 DIAGNOSIS — I119 Hypertensive heart disease without heart failure: Secondary | ICD-10-CM | POA: Insufficient documentation

## 2022-10-30 DIAGNOSIS — R079 Chest pain, unspecified: Secondary | ICD-10-CM | POA: Diagnosis not present

## 2022-10-30 DIAGNOSIS — S8991XA Unspecified injury of right lower leg, initial encounter: Secondary | ICD-10-CM | POA: Diagnosis not present

## 2022-10-30 DIAGNOSIS — I10 Essential (primary) hypertension: Secondary | ICD-10-CM | POA: Diagnosis not present

## 2022-10-30 DIAGNOSIS — S81812A Laceration without foreign body, left lower leg, initial encounter: Secondary | ICD-10-CM | POA: Diagnosis not present

## 2022-10-30 DIAGNOSIS — S299XXA Unspecified injury of thorax, initial encounter: Secondary | ICD-10-CM | POA: Diagnosis not present

## 2022-10-30 MED ORDER — ACETAMINOPHEN 500 MG PO TABS
1000.0000 mg | ORAL_TABLET | Freq: Once | ORAL | Status: AC
  Administered 2022-10-30: 1000 mg via ORAL
  Filled 2022-10-30: qty 2

## 2022-10-30 MED ORDER — LIDOCAINE-EPINEPHRINE 2 %-1:100000 IJ SOLN
20.0000 mL | Freq: Once | INTRAMUSCULAR | Status: AC
  Administered 2022-10-30: 20 mL
  Filled 2022-10-30: qty 1

## 2022-10-30 MED ORDER — TETANUS-DIPHTH-ACELL PERTUSSIS 5-2.5-18.5 LF-MCG/0.5 IM SUSY
0.5000 mL | PREFILLED_SYRINGE | Freq: Once | INTRAMUSCULAR | Status: AC
  Administered 2022-10-30: 0.5 mL via INTRAMUSCULAR
  Filled 2022-10-30: qty 0.5
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
## 2022-10-30 NOTE — Discharge Instructions (Addendum)
Please have the stitches taken out in 7 to 10 days.  Make sure to keep the wound clean and look out for any signs of infection including increasing redness or pus or increasing pain.  If you notice infection please either return to the ER or see your primary doctor.
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
## 2022-11-06 ENCOUNTER — Emergency Department: Payer: PPO

## 2022-11-06 ENCOUNTER — Emergency Department
Admission: EM | Admit: 2022-11-06 | Discharge: 2022-11-06 | Disposition: A | Discharge status: Home / Self Care | Payer: PPO | Attending: Student in an Organized Health Care Education/Training Program | Admitting: Student in an Organized Health Care Education/Training Program

## 2022-11-06 ENCOUNTER — Other Ambulatory Visit: Payer: Self-pay

## 2022-11-06 ENCOUNTER — Telehealth: Payer: Self-pay

## 2022-11-06 DIAGNOSIS — W19XXXA Unspecified fall, initial encounter: Secondary | ICD-10-CM

## 2022-11-06 DIAGNOSIS — S4992XA Unspecified injury of left shoulder and upper arm, initial encounter: Secondary | ICD-10-CM | POA: Diagnosis present

## 2022-11-06 DIAGNOSIS — Y92009 Unspecified place in unspecified non-institutional (private) residence as the place of occurrence of the external cause: Secondary | ICD-10-CM | POA: Insufficient documentation

## 2022-11-06 DIAGNOSIS — R58 Hemorrhage, not elsewhere classified: Secondary | ICD-10-CM | POA: Diagnosis not present

## 2022-11-06 DIAGNOSIS — S0990XA Unspecified injury of head, initial encounter: Secondary | ICD-10-CM | POA: Diagnosis not present

## 2022-11-06 DIAGNOSIS — S42292A Other displaced fracture of upper end of left humerus, initial encounter for closed fracture: Secondary | ICD-10-CM | POA: Diagnosis not present

## 2022-11-06 DIAGNOSIS — R609 Edema, unspecified: Secondary | ICD-10-CM | POA: Diagnosis not present

## 2022-11-06 DIAGNOSIS — I10 Essential (primary) hypertension: Secondary | ICD-10-CM | POA: Insufficient documentation

## 2022-11-06 DIAGNOSIS — R0689 Other abnormalities of breathing: Secondary | ICD-10-CM | POA: Diagnosis not present

## 2022-11-06 DIAGNOSIS — I2510 Atherosclerotic heart disease of native coronary artery without angina pectoris: Secondary | ICD-10-CM | POA: Diagnosis not present

## 2022-11-06 DIAGNOSIS — S42202A Unspecified fracture of upper end of left humerus, initial encounter for closed fracture: Secondary | ICD-10-CM | POA: Diagnosis not present

## 2022-11-06 DIAGNOSIS — W01198A Fall on same level from slipping, tripping and stumbling with subsequent striking against other object, initial encounter: Secondary | ICD-10-CM | POA: Diagnosis not present

## 2022-11-06 DIAGNOSIS — S42352A Displaced comminuted fracture of shaft of humerus, left arm, initial encounter for closed fracture: Secondary | ICD-10-CM | POA: Diagnosis not present

## 2022-11-06 DIAGNOSIS — S199XXA Unspecified injury of neck, initial encounter: Secondary | ICD-10-CM | POA: Diagnosis not present

## 2022-11-06 DIAGNOSIS — R110 Nausea: Secondary | ICD-10-CM | POA: Diagnosis not present

## 2022-11-06 LAB — CBC WITH DIFFERENTIAL/PLATELET
Abs Immature Granulocytes: 0.04 10*3/uL (ref 0.00–0.07)
Basophils Absolute: 0.1 10*3/uL (ref 0.0–0.1)
Basophils Relative: 1 %
Eosinophils Absolute: 0.1 10*3/uL (ref 0.0–0.5)
Eosinophils Relative: 1 %
HCT: 33.1 % — ABNORMAL LOW (ref 36.0–46.0)
Hemoglobin: 9.9 g/dL — ABNORMAL LOW (ref 12.0–15.0)
Immature Granulocytes: 0 %
Lymphocytes Relative: 13 %
Lymphs Abs: 1.3 10*3/uL (ref 0.7–4.0)
MCH: 20.8 pg — ABNORMAL LOW (ref 26.0–34.0)
MCHC: 29.9 g/dL — ABNORMAL LOW (ref 30.0–36.0)
MCV: 69.7 fL — ABNORMAL LOW (ref 80.0–100.0)
Monocytes Absolute: 0.6 10*3/uL (ref 0.1–1.0)
Monocytes Relative: 6 %
Neutro Abs: 7.4 10*3/uL (ref 1.7–7.7)
Neutrophils Relative %: 79 %
Platelets: 212 10*3/uL (ref 150–400)
RBC: 4.75 MIL/uL (ref 3.87–5.11)
RDW: 15.9 % — ABNORMAL HIGH (ref 11.5–15.5)
Smear Review: NORMAL
WBC: 9.5 10*3/uL (ref 4.0–10.5)
nRBC: 0 % (ref 0.0–0.2)

## 2022-11-06 LAB — COMPREHENSIVE METABOLIC PANEL
ALT: 22 U/L (ref 0–44)
AST: 28 U/L (ref 15–41)
Albumin: 3.8 g/dL (ref 3.5–5.0)
Alkaline Phosphatase: 93 U/L (ref 38–126)
Anion gap: 9 (ref 5–15)
BUN: 23 mg/dL (ref 8–23)
CO2: 22 mmol/L (ref 22–32)
Calcium: 8.7 mg/dL — ABNORMAL LOW (ref 8.9–10.3)
Chloride: 109 mmol/L (ref 98–111)
Creatinine, Ser: 0.76 mg/dL (ref 0.44–1.00)
GFR, Estimated: >60 mL/min (ref >60)
Glucose, Bld: 164 mg/dL — ABNORMAL HIGH (ref 70–99)
Potassium: 3.8 mmol/L (ref 3.5–5.1)
Sodium: 140 mmol/L (ref 135–145)
Total Bilirubin: 1.2 mg/dL (ref 0.3–1.2)
Total Protein: 6.8 g/dL (ref 6.5–8.1)

## 2022-11-06 MED ORDER — MORPHINE SULFATE (PF) 4 MG/ML IV SOLN
4.0000 mg | INTRAVENOUS | Status: DC | PRN
  Administered 2022-11-06 (×2): 4 mg via INTRAVENOUS
  Filled 2022-11-06 (×2): qty 1

## 2022-11-06 MED ORDER — METOCLOPRAMIDE HCL 5 MG/ML IJ SOLN
10.0000 mg | Freq: Once | INTRAMUSCULAR | Status: AC
  Administered 2022-11-06: 10 mg via INTRAVENOUS
  Filled 2022-11-06: qty 2

## 2022-11-06 MED ORDER — POLYETHYLENE GLYCOL 3350 17 G PO PACK: 17.0000 g | PACK | Freq: Every day | ORAL | 0 refills | Status: DC

## 2022-11-06 MED ORDER — HYDROCODONE-ACETAMINOPHEN 5-325 MG PO TABS: 1.0000 | ORAL_TABLET | ORAL | 0 refills | Status: DC | PRN

## 2022-11-06 MED ORDER — TRAMADOL HCL 50 MG PO TABS: 50.0000 mg | ORAL_TABLET | Freq: Four times a day (QID) | ORAL | 0 refills | Status: DC | PRN

## 2022-11-06 MED ORDER — FENTANYL CITRATE PF 50 MCG/ML IJ SOSY
50.0000 ug | PREFILLED_SYRINGE | INTRAMUSCULAR | Status: DC | PRN
  Administered 2022-11-06: 50 ug via INTRAVENOUS
  Filled 2022-11-06: qty 1

## 2022-11-06 NOTE — ED Notes (Signed)
Patient transported to CT 

## 2022-11-06 NOTE — Telephone Encounter (Signed)
     Patient  visit on [place]   at 3/20  Have you been able to follow up with your primary care physician? Yes   The patient was or was not able to obtain any needed medicine or equipment. Yes   Are there diet recommendations that you are having difficulty following? Na   Patient expresses understanding of discharge instructions and education provided has no other needs at this time.  Yes     [facility] [phone] [address], [place], [place] [address] Phone: [phone] Email: [email or web address]

## 2022-11-06 NOTE — ED Provider Notes (Signed)
[facility] Provider Note    Event Date/Time   First MD Initiated Contact with Patient 11/06/22 [phone]     (approximate)   History   Fall and Arm Injury   HPI  Lydia Elliott is a 72 y.o. female history of hypertension high lipidemia, CAD presents to the ER after mechanical fall where she tripped over her foot on hardwood floor striking her head against the closet door and hitting her left arm.  Her primary complaint is moderate to severe left arm pain not able to move it secondary to pain.  No numbness or tingling.  Denies any neck pain.     Physical Exam   Triage Vital Signs: ED Triage Vitals  Enc Vitals Group     BP      Pulse      Resp      Temp      Temp src      SpO2      Weight      Height      Head Circumference      Peak Flow      Pain Score      Pain Loc      Pain Edu?      Excl. in [place]?     Most recent vital signs: Vitals:   11/06/22 1900 11/06/22 1930  BP: 137/85 122/76  Pulse: 75 75  Resp: 19 (!) 22  Temp:    SpO2: 97% 96%     Constitutional: Alert  Eyes: Conjunctivae are normal.  Head: Atraumatic. Nose: No congestion/rhinnorhea. Mouth/Throat: Mucous membranes are moist.   Neck: Painless ROM.  Cardiovascular:   Good peripheral circulation. Respiratory: Normal respiratory effort.  No retractions.  Gastrointestinal: Soft and nontender.  Musculoskeletal: Swelling to the left upper arm.  Neurovascular intact distally. Neurologic:  MAE spontaneously. No gross focal neurologic deficits are appreciated.  Skin:  Skin is warm, dry and intact. No rash noted. Psychiatric: Mood and affect are normal. Speech and behavior are normal.    ED Results / Procedures / Treatments   Labs (all labs ordered are listed, but only abnormal results are displayed) Labs Reviewed  CBC WITH DIFFERENTIAL/PLATELET - Abnormal; Notable for the following components:      Result Value   Hemoglobin 9.9 (*)    HCT 33.1 (*)    MCV 69.7 (*)     MCH 20.8 (*)    MCHC 29.9 (*)    RDW 15.9 (*)    All other components within normal limits  COMPREHENSIVE METABOLIC PANEL - Abnormal; Notable for the following components:   Glucose, Bld 164 (*)    Calcium 8.7 (*)    All other components within normal limits     EKG  ED ECG REPORT I, Merlyn Lot, the attending physician, personally viewed and interpreted this ECG.   Date: 11/06/2022  EKG Time: 18:38  Rate: 80  Rhythm: sinus  Axis: normal  Intervals:normal  ST&T Change: no stemi, no depressions    RADIOLOGY Please see ED Course for my review and interpretation.  I personally reviewed all radiographic images ordered to evaluate for the above acute complaints and reviewed radiology reports and findings.  These findings were personally discussed with the patient.  Please see medical record for radiology report.    PROCEDURES:  Critical Care performed: no  Procedures   MEDICATIONS ORDERED IN ED: Medications  morphine (PF) 4 MG/ML injection 4 mg (4 mg Intravenous Given 11/06/22 1644)  fentaNYL (SUBLIMAZE)  injection 50 mcg (50 mcg Intravenous Given 11/06/22 1841)  metoCLOPramide (REGLAN) injection 10 mg (10 mg Intravenous Given 11/06/22 1644)     IMPRESSION / MDM / ASSESSMENT AND PLAN / ED COURSE  I reviewed the triage vital signs and the nursing notes.                              Differential diagnosis includes, but is not limited to,, contusion, concussion, SDH, IPH, musculoskeletal injury  Patient presenting to the ER for evaluation of symptoms as described above.  Based on symptoms, risk factors and considered above differential, this presenting complaint could reflect a potentially life-threatening illness therefore the patient will be placed on continuous pulse oximetry and telemetry for monitoring.  Laboratory evaluation will be sent to evaluate for the above complaints.  IV morphine and antiemetic given for symptom control.   Clinical Course as of  11/06/22 2012  Wed Nov 06, 2022  1720 X-ray humerus on my review and interpretation shows evidence of displaced proximal humerus fracture. [PR]  1747 Discussed the patient's imaging presentation with Dr. Roland Rack of orthopedics who has requested CT of the humerus for operative planning. [PR]  2011 Patient's pain controlled.  Does appear stable and appropriate for outpatient follow-up.  Discussed strict return precautions.  [PR]    Clinical Course User Index [PR] Merlyn Lot, MD     FINAL CLINICAL IMPRESSION(S) / ED DIAGNOSES   Final diagnoses:  Fall, initial encounter  Other closed displaced fracture of proximal end of left humerus, initial encounter     Rx / DC Orders   ED Discharge Orders          Ordered    HYDROcodone-acetaminophen (NORCO) 5-325 MG tablet  Every 4 hours PRN        11/06/22 2010    traMADol (ULTRAM) 50 MG tablet  Every 6 hours PRN        11/06/22 2010    polyethylene glycol (MIRALAX / GLYCOLAX) 17 g packet  Daily        11/06/22 2010             Note:  This document was prepared using Dragon voice recognition software and may include unintentional dictation errors.    Merlyn Lot, MD 11/06/22 2013
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
## 2022-11-11 ENCOUNTER — Telehealth: Payer: Self-pay

## 2022-11-11 DIAGNOSIS — I10 Essential (primary) hypertension: Secondary | ICD-10-CM | POA: Diagnosis not present

## 2022-11-11 DIAGNOSIS — Z4802 Encounter for removal of sutures: Secondary | ICD-10-CM | POA: Diagnosis not present

## 2022-11-11 DIAGNOSIS — S81812D Laceration without foreign body, left lower leg, subsequent encounter: Secondary | ICD-10-CM | POA: Diagnosis not present

## 2022-11-11 NOTE — Telephone Encounter (Signed)
        Patient  visited  [place] 3/27  Telephone encounter attempt : 1st   A HIPAA compliant voice message was left requesting a return call.  Instructed patient to call back .    [facility] [phone] [address], [place], [place] [address] Phone: [phone] Email: [email or web address]

## 2022-11-12 ENCOUNTER — Telehealth: Payer: Self-pay

## 2022-11-12 NOTE — Telephone Encounter (Signed)
     Patient  visit on 3/27  at [place]    Have you been able to follow up with your primary care physician? Yes   The patient was or was not able to obtain any needed medicine or equipment. Yes   Are there diet recommendations that you are having difficulty following? Na   Patient expresses understanding of discharge instructions and education provided has no other needs at this time.  Yes      Lydia Elliott [facility], [place] [phone] [address], [place], [place] [address] Phone: [phone] Email: [email or web address]    

## 2022-11-13 ENCOUNTER — Other Ambulatory Visit: Payer: Self-pay

## 2022-11-13 ENCOUNTER — Encounter (HOSPITAL_COMMUNITY): Payer: Self-pay | Admitting: Orthopedic Surgery

## 2022-11-13 DIAGNOSIS — S42202A Unspecified fracture of upper end of left humerus, initial encounter for closed fracture: Secondary | ICD-10-CM | POA: Diagnosis not present

## 2022-11-13 DIAGNOSIS — M25561 Pain in right knee: Secondary | ICD-10-CM | POA: Diagnosis not present

## 2022-11-13 NOTE — H&P (Signed)
Orthopaedic Trauma Service (OTS) Consult   Patient ID: Lydia Elliott MRN: [id number] DOB/AGE: May 04, 1951 72 y.o.     HPI: Lydia Elliott is an 72 y.o. right-hand-dominant female who sustained a fall at home while carrying laundry.  Patient ended up hitting the door frame as well as the floor.  She had immediate onset of pain in her left shoulder.  She was brought to [facility] where she was found to have a comminuted left proximal humerus fracture.  Patient was scheduled to see Dr. Roland Rack but due to the fracture pattern he felt that this was outside the scope of his practice and requested evaluation by the orthopedic trauma service.  Patient was seen and evaluated in the office on 11/13/2022.  She complains of severe pain in her left arm.  She feels her left shoulder moving with even the slightest bit of motion.  She is wearing a sling but this does not help all that much.  She does have a little bit of tingling in her fingers but nothing else of significance.  She denies any pain in her right upper extremity.  Does have some mild right knee pain but not too severe and she is able to weight-bear on her right leg without difficulty.  She does have a history of septic arthritis in her right knee following right knee arthroscopy this was treated with serial debridements and for 4 weeks of IV antibiotics with a PICC line.  This started in March 2023 and she finished treatment June 2023.  Rightfully she is little concerned about the status of her right leg.  Patient states that the hydrocodone does help her pain but she does not like taking pain medicine.  Reported history of psoriatic arthritis and was on Humira for very brief period of time but has not been on it in about a year.  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  No issues with respect to this.  Remote history of Takotsubo cardiomyopathy.  History of stent placement for CAD in 2010.  Has had no notable issues with respect to  her heart.  Echocardiogram 2021 shows an EF 55%.  Bone density scan from August 2023 shows osteoporosis of the left hip with a T-score of -2.8 left femoral neck  Patient lives alone but has family that helps her she is independent with all activities prior to her injury.  Risks and benefits of surgery versus nonsurgical management reviewed with the patient.  She wishes to proceed with surgery  Past Medical History:  Diagnosis Date   Anemia    Anxiety    a.) on BZO   Aortic atherosclerosis    Arthritis    hands, knees, toes   Bone island 10/15/2016   a.) RIGHT humeral head   CAD (coronary artery disease)    a.) 2010 --> PCI with stent placement x 1 (type location unknown)   Cataract    Depression    GERD (gastroesophageal reflux disease)    Hyperlipidemia    Hypertension    IBS (irritable bowel syndrome)    Insomnia    a.) on zolpidem   Nodule of right lung 10/18/2016   Psoriatic arthritis    Takotsubo cardiomyopathy    Thalassemia    Vertigo    none - 10 yrs   Wears dentures    partial lower    Past Surgical History:  Procedure Laterality Date   APPENDECTOMY  08/12/1965   CARPAL TUNNEL RELEASE  08/12/2005   CATARACT EXTRACTION  08/13/2011   CHOLECYSTECTOMY  08/12/1998   COLONOSCOPY  03/11/2014   Dr.Wohl   COLONOSCOPY WITH PROPOFOL N/A 12/11/2015   Procedure: COLONOSCOPY WITH PROPOFOL;  Surgeon: Lucilla Lame, MD;  Location: [facility];  Service: Endoscopy;  Laterality: N/A;  DESCENDING COLON POLYP    CORONARY ANGIOPLASTY WITH STENT PLACEMENT  08/12/2008   ESOPHAGOGASTRODUODENOSCOPY  02/21/2014   Dr.Wohl   ESOPHAGOGASTRODUODENOSCOPY (EGD) WITH PROPOFOL N/A 12/11/2015   Procedure: ESOPHAGOGASTRODUODENOSCOPY (EGD) WITH PROPOFOL;  Surgeon: Lucilla Lame, MD;  Location: [facility];  Service: Endoscopy;  Laterality: N/A;   INCISION AND DRAINAGE Right 12/17/2021   Procedure: INCISION AND DRAINAGE;  Surgeon: Thornton Park, MD;  Location: [facility];  Service:  Orthopedics;  Laterality: Right;   KNEE ARTHROSCOPY Right 10/04/2021   Procedure: ARTHROSCOPY KNEE;  Surgeon: Thornton Park, MD;  Location: [facility];  Service: Orthopedics;  Laterality: Right;   KNEE ARTHROSCOPY Right 10/30/2021   Procedure: ARTHROSCOPY KNEE, I&D OF SUPERFICIAL ABSCESS;  Surgeon: Thornton Park, MD;  Location: [facility];  Service: Orthopedics;  Laterality: Right;   POLYPECTOMY N/A 12/11/2015   Procedure: POLYPECTOMY;  Surgeon: Lucilla Lame, MD;  Location: [facility];  Service: Endoscopy;  Laterality: N/A;   TONSILLECTOMY     TONSILLECTOMY AND ADENOIDECTOMY  08/13/1967    Family History  Problem Relation Age of Onset   Cirrhosis Mother    Hepatitis Mother    Thalassemia Mother    Diabetes Mother    Heart disease Father    Diabetes Father    Hyperlipidemia Father    Hypertension Father    Stroke Father    Cancer Sister        lung   Diabetes Sister    Stroke Sister        during a surgery   Diabetes Brother    Heart disease Brother    Hypertension Brother    Stroke Brother    Thalassemia Maternal Grandmother    COPD Neg Hx     Social History:  reports that she quit smoking about 52 years ago. Her smoking use included cigarettes. She has a 0.50 pack-year smoking history. She has never used smokeless tobacco. She reports current alcohol use of about 1.0 standard drink of alcohol per week. She reports that she does not use drugs.  Allergies:  Allergies  Allergen Reactions   Rosuvastatin     Muscle Pain    Medications: I have reviewed the patient's current medications. Current Outpatient Medications  Medication Instructions   Adalimumab 40 MG/0.8ML PSKT Discuss with your Rheumatologist before taking this given current infection   ALPRAZolam (XANAX) 0.25 mg, Oral, 2 times daily PRN   amoxicillin (AMOXIL) 500 mg, Oral, 2 times daily   aspirin EC 325 mg, Oral, Daily   atorvastatin (LIPITOR) 20 mg, Oral, Daily   busPIRone (BUSPAR) 5 mg, Oral, Daily    carvedilol (COREG) 3.125 mg, Oral, 2 times daily with meals   diclofenac Sodium (VOLTAREN) 1 % GEL 1 application , Topical, 2 times daily PRN   escitalopram (LEXAPRO) 20 MG tablet TAKE 1 TABLET BY MOUTH EVERY DAY   HYDROcodone-acetaminophen (NORCO) 5-325 MG tablet 1-2 tablets, Oral, Every 6 hours PRN   HYDROcodone-acetaminophen (NORCO) 5-325 MG tablet 1 tablet, Oral, Every 4 hours PRN   losartan (COZAAR) 25 mg, Oral, 2 times daily   omeprazole (PRILOSEC) 20 mg, Oral, Daily   polyethylene glycol (MIRALAX / GLYCOLAX) 17 g, Oral, Daily, Mix one tablespoon with 8oz of  your favorite juice or water every day until you are having soft formed stools. Then start taking once daily if you didn't have a stool the day before.   traMADol (ULTRAM) 50 mg, Oral, Every 6 hours PRN   zolpidem (AMBIEN) 5 mg, Oral, At bedtime PRN     No results found for this or any previous visit (from the past 48 hour(s)).  No results found.  Intake/Output    None      Review of Systems  Constitutional:  Negative for chills and fever.  Respiratory:  Negative for shortness of breath and wheezing.   Cardiovascular:  Negative for chest pain and palpitations.  Gastrointestinal:  Negative for nausea and vomiting.  Musculoskeletal:        Left shoulder pain  Neurological:  Negative for tingling.   There were no vitals taken for this visit. Physical Exam Constitutional:      Appearance: Normal appearance.  HENT:     Head: Normocephalic and atraumatic.  Eyes:     Extraocular Movements: Extraocular movements intact.  Cardiovascular:     Rate and Rhythm: Normal rate and regular rhythm.  Pulmonary:     Effort: Pulmonary effort is normal.     Breath sounds: Normal breath sounds.  Musculoskeletal:     Comments: Left upper extremity Patient is in a sling Moderate ecchymosis noted to the left axilla extending down to the elbow Mild swelling to the left upper extremity extending to the hand as well Radial, ulnar,  median nerve motor and sensory functions are grossly intact AIN and PIN motor functions intact Axillary nerve sensation is intact No pain with palpation of the elbow forearm, wrist or hand + Radial pulse Did not manipulate left shoulder due to known fracture No traumatic wounds appreciated  Skin:    General: Skin is warm and dry.     Capillary Refill: Capillary refill takes less than 2 seconds.  Neurological:     General: No focal deficit present.     Mental Status: She is alert and oriented to person, place, and time.  Psychiatric:        Mood and Affect: Mood normal.        Behavior: Behavior normal.        Thought Content: Thought content normal.        Judgment: Judgment normal.      Assessment/Plan:  72 year old right-hand-dominant female fall with left proximal humerus fracture  -Comminuted left proximal humerus fracture  OR for ORIF  Anticipate overnight stay, work with therapies and discharge in the morning  Nonweightbearing left upper extremity for 6 weeks  Shoulder pendulums for the first 2 weeks followed by passive shoulder motion for the next 2 weeks starting with active assisted motion around the 4-week mark, active motion at the 6-week mark and progressive resistive exercises at the 8-week mark   Risks and benefits reviewed with the patient and she wishes to proceed  - Pain management:  Multimodal.  Patient is very hesitant to take any pain medications.  Will titrate accordingly  Hold on anti-inflammatories for 6 weeks postop - ABL anemia/Hemodynamics  Monitor - Medical issues   Resume home meds as indicated - DVT/PE prophylaxis:  Will not require pharmacologic DVT prophylaxis at discharge - ID:   Perioperative antibiotics - Metabolic Bone Disease:  Check vitamin D levels  Osteoporosis DEXA scan 2023   We can discuss treatment of osteoporosis at her follow-up  - Dispo:  OR for ORIF left proximal humerus  Jari Pigg, PA-C [phone]  (C) 11/13/2022, 5:51 PM  [facility] [place] [address] [phone] Jenetta [phone] (F)    After 5pm and on the weekends please log on to Amion, go to orthopaedics and the look under the [facility] Call for the provider(s) on call. You can also call our office at [phone] and then follow the prompts to be connected to the call team.

## 2022-11-13 NOTE — Progress Notes (Signed)
SDW call  Patient was given pre-op instructions over the phone. Patient verbalized understanding of instructions provided.     PCP - Dr. Adrian Prows. [facility] [place] [facility]ologist - Dr. Ubaldo Glassing. [place] [facility]: Denies   PPM/ICD - Denies   Chest x-ray - 11/06/2022 EKG -  11/06/2022 Stress Test - ECHO - 2021 Cardiac Cath - 2010  Sleep Study/sleep apnea/CPAP: Denies  Non-diabetic   Blood Thinner Instructions: Denies Aspirin Instructions:Do not take in the morning, DOS, 11/14/2022   ERAS Protcol - Yes, clear liquids until 3 hours prior to surgery (no pre-op orders). Last PO fluids at 0730 PRE-SURGERY Ensure or G2- No   COVID TEST- n/a     Anesthesia review:  Yes. HTN, CAD, Thalassemia, aortic athersclerosis, chigh cholesterol, cardiomyopathy.Damaris Schooner with Dr. Roanna Banning. Patient to be evaluated on DOS.    Patient denies shortness of breath, fever, cough and chest pain over the phone call    Your procedure is scheduled on Thursday November 14, 2022   Report to [facility] Main Entrance "A" at  0800  A.M., then check in with the Admitting office.  Call this number if you have problems the morning of surgery:  [phone]   If you have any questions prior to your surgery date call [phone]: Open Monday-Friday 8am-4pm If you experience any cold or flu symptoms such as cough, fever, chills, shortness of breath, etc. between now and your scheduled surgery, please notify us at the above number     Remember:  Do not eat after midnight the night before your surgery  You may drink clear liquids until 0730 the morning of your surgery.   Clear liquids allowed are: Water, Non-Citrus Juices (without pulp), Carbonated Beverages, Clear Tea, Black Coffee ONLY (NO MILK, CREAM OR POWDERED CREAMER of any kind), and Gatorade   Take these medicines the morning of surgery with A SIP OF WATER:  Atorvastatin, Busbar, Carvedilol, Lexapro  As needed: Tylenol, xanax, norco, tramadol  As of today,  STOP taking any Aspirin (unless otherwise instructed by your surgeon) Aleve, Naproxen, Ibuprofen, Motrin, Advil, Goody's, BC's, all herbal medications, fish oil, and all vitamins.  This includes your voltaren gel.

## 2022-11-14 ENCOUNTER — Encounter (HOSPITAL_COMMUNITY)
Admission: RE | Disposition: A | Discharge status: Home / Self Care | Payer: Self-pay | Source: Home / Self Care | Attending: Orthopedic Surgery

## 2022-11-14 ENCOUNTER — Ambulatory Visit (HOSPITAL_BASED_OUTPATIENT_CLINIC_OR_DEPARTMENT_OTHER): Payer: PPO | Admitting: Anesthesiology

## 2022-11-14 ENCOUNTER — Ambulatory Visit (HOSPITAL_COMMUNITY): Payer: PPO | Admitting: Anesthesiology

## 2022-11-14 ENCOUNTER — Ambulatory Visit (HOSPITAL_COMMUNITY): Payer: PPO

## 2022-11-14 ENCOUNTER — Other Ambulatory Visit: Payer: Self-pay

## 2022-11-14 ENCOUNTER — Encounter (HOSPITAL_COMMUNITY): Payer: Self-pay | Admitting: Orthopedic Surgery

## 2022-11-14 ENCOUNTER — Ambulatory Visit (HOSPITAL_COMMUNITY)
Admission: RE | Admit: 2022-11-14 | Discharge: 2022-11-15 | Disposition: A | Discharge status: Home / Self Care | Payer: PPO | Attending: Orthopedic Surgery | Admitting: Orthopedic Surgery

## 2022-11-14 DIAGNOSIS — S42202A Unspecified fracture of upper end of left humerus, initial encounter for closed fracture: Secondary | ICD-10-CM | POA: Diagnosis present

## 2022-11-14 DIAGNOSIS — Z01818 Encounter for other preprocedural examination: Secondary | ICD-10-CM | POA: Diagnosis not present

## 2022-11-14 DIAGNOSIS — M8000XA Age-related osteoporosis with current pathological fracture, unspecified site, initial encounter for fracture: Secondary | ICD-10-CM

## 2022-11-14 DIAGNOSIS — M810 Age-related osteoporosis without current pathological fracture: Secondary | ICD-10-CM | POA: Diagnosis not present

## 2022-11-14 DIAGNOSIS — E559 Vitamin D deficiency, unspecified: Secondary | ICD-10-CM | POA: Diagnosis present

## 2022-11-14 DIAGNOSIS — I700 Atherosclerosis of aorta: Secondary | ICD-10-CM

## 2022-11-14 DIAGNOSIS — W19XXXA Unspecified fall, initial encounter: Secondary | ICD-10-CM | POA: Diagnosis not present

## 2022-11-14 DIAGNOSIS — Z955 Presence of coronary angioplasty implant and graft: Secondary | ICD-10-CM | POA: Insufficient documentation

## 2022-11-14 DIAGNOSIS — W2209XA Striking against other stationary object, initial encounter: Secondary | ICD-10-CM | POA: Insufficient documentation

## 2022-11-14 DIAGNOSIS — S42292A Other displaced fracture of upper end of left humerus, initial encounter for closed fracture: Secondary | ICD-10-CM | POA: Diagnosis not present

## 2022-11-14 DIAGNOSIS — I10 Essential (primary) hypertension: Secondary | ICD-10-CM | POA: Insufficient documentation

## 2022-11-14 DIAGNOSIS — R911 Solitary pulmonary nodule: Secondary | ICD-10-CM

## 2022-11-14 DIAGNOSIS — Z87891 Personal history of nicotine dependence: Secondary | ICD-10-CM | POA: Diagnosis not present

## 2022-11-14 DIAGNOSIS — G8918 Other acute postprocedural pain: Secondary | ICD-10-CM | POA: Diagnosis not present

## 2022-11-14 DIAGNOSIS — R7301 Impaired fasting glucose: Secondary | ICD-10-CM

## 2022-11-14 DIAGNOSIS — D124 Benign neoplasm of descending colon: Secondary | ICD-10-CM

## 2022-11-14 DIAGNOSIS — I2510 Atherosclerotic heart disease of native coronary artery without angina pectoris: Secondary | ICD-10-CM | POA: Insufficient documentation

## 2022-11-14 DIAGNOSIS — I5181 Takotsubo syndrome: Secondary | ICD-10-CM

## 2022-11-14 DIAGNOSIS — R2681 Unsteadiness on feet: Secondary | ICD-10-CM | POA: Diagnosis not present

## 2022-11-14 DIAGNOSIS — E663 Overweight: Secondary | ICD-10-CM

## 2022-11-14 DIAGNOSIS — M2550 Pain in unspecified joint: Secondary | ICD-10-CM

## 2022-11-14 DIAGNOSIS — R4589 Other symptoms and signs involving emotional state: Secondary | ICD-10-CM

## 2022-11-14 DIAGNOSIS — Z8249 Family history of ischemic heart disease and other diseases of the circulatory system: Secondary | ICD-10-CM | POA: Diagnosis not present

## 2022-11-14 DIAGNOSIS — I519 Heart disease, unspecified: Secondary | ICD-10-CM

## 2022-11-14 DIAGNOSIS — K219 Gastro-esophageal reflux disease without esophagitis: Secondary | ICD-10-CM | POA: Insufficient documentation

## 2022-11-14 DIAGNOSIS — F418 Other specified anxiety disorders: Secondary | ICD-10-CM

## 2022-11-14 DIAGNOSIS — S42242A 4-part fracture of surgical neck of left humerus, initial encounter for closed fracture: Secondary | ICD-10-CM | POA: Diagnosis not present

## 2022-11-14 DIAGNOSIS — S42302A Unspecified fracture of shaft of humerus, left arm, initial encounter for closed fracture: Secondary | ICD-10-CM | POA: Diagnosis not present

## 2022-11-14 DIAGNOSIS — I119 Hypertensive heart disease without heart failure: Secondary | ICD-10-CM | POA: Diagnosis not present

## 2022-11-14 HISTORY — DX: Unspecified fracture of upper end of left humerus, initial encounter for closed fracture: S42.202A

## 2022-11-14 HISTORY — PX: ORIF HUMERUS FRACTURE: SHX2126

## 2022-11-14 LAB — CBC WITH DIFFERENTIAL/PLATELET
Abs Immature Granulocytes: 0.03 10*3/uL (ref 0.00–0.07)
Basophils Absolute: 0.1 10*3/uL (ref 0.0–0.1)
Basophils Relative: 1 %
Eosinophils Absolute: 0.1 10*3/uL (ref 0.0–0.5)
Eosinophils Relative: 2 %
HCT: 31.8 % — ABNORMAL LOW (ref 36.0–46.0)
Hemoglobin: 9.3 g/dL — ABNORMAL LOW (ref 12.0–15.0)
Immature Granulocytes: 0 %
Lymphocytes Relative: 16 %
Lymphs Abs: 1.1 10*3/uL (ref 0.7–4.0)
MCH: 20.6 pg — ABNORMAL LOW (ref 26.0–34.0)
MCHC: 29.2 g/dL — ABNORMAL LOW (ref 30.0–36.0)
MCV: 70.5 fL — ABNORMAL LOW (ref 80.0–100.0)
Monocytes Absolute: 0.6 10*3/uL (ref 0.1–1.0)
Monocytes Relative: 9 %
Neutro Abs: 4.9 10*3/uL (ref 1.7–7.7)
Neutrophils Relative %: 72 %
Platelets: 279 10*3/uL (ref 150–400)
RBC: 4.51 MIL/uL (ref 3.87–5.11)
RDW: 16.5 % — ABNORMAL HIGH (ref 11.5–15.5)
WBC: 6.8 10*3/uL (ref 4.0–10.5)
nRBC: 0 % (ref 0.0–0.2)

## 2022-11-14 LAB — COMPREHENSIVE METABOLIC PANEL
ALT: 22 U/L (ref 0–44)
AST: 28 U/L (ref 15–41)
Albumin: 3.6 g/dL (ref 3.5–5.0)
Alkaline Phosphatase: 100 U/L (ref 38–126)
Anion gap: 9 (ref 5–15)
BUN: 15 mg/dL (ref 8–23)
CO2: 24 mmol/L (ref 22–32)
Calcium: 9 mg/dL (ref 8.9–10.3)
Chloride: 106 mmol/L (ref 98–111)
Creatinine, Ser: 0.71 mg/dL (ref 0.44–1.00)
GFR, Estimated: >60 mL/min (ref >60)
Glucose, Bld: 118 mg/dL — ABNORMAL HIGH (ref 70–99)
Potassium: 3.5 mmol/L (ref 3.5–5.1)
Sodium: 139 mmol/L (ref 135–145)
Total Bilirubin: 1.7 mg/dL — ABNORMAL HIGH (ref 0.3–1.2)
Total Protein: 6.6 g/dL (ref 6.5–8.1)

## 2022-11-14 LAB — VITAMIN D 25 HYDROXY (VIT D DEFICIENCY, FRACTURES): Vit D, 25-Hydroxy: 14.8 ng/mL — ABNORMAL LOW (ref 30–100)

## 2022-11-14 SURGERY — OPEN REDUCTION INTERNAL FIXATION (ORIF) PROXIMAL HUMERUS FRACTURE
Anesthesia: General | Laterality: Left

## 2022-11-14 MED ORDER — BUSPIRONE HCL 5 MG PO TABS
5.0000 mg | ORAL_TABLET | Freq: Every day | ORAL | Status: DC
  Administered 2022-11-15: 5 mg via ORAL
  Filled 2022-11-14: qty 1

## 2022-11-14 MED ORDER — VANCOMYCIN HCL 1000 MG IV SOLR
INTRAVENOUS | Status: AC
  Filled 2022-11-14: qty 20

## 2022-11-14 MED ORDER — DOCUSATE SODIUM 100 MG PO CAPS
100.0000 mg | ORAL_CAPSULE | Freq: Two times a day (BID) | ORAL | Status: DC
  Administered 2022-11-14 – 2022-11-15 (×2): 100 mg via ORAL
  Filled 2022-11-14 (×2): qty 1

## 2022-11-14 MED ORDER — FENTANYL CITRATE (PF) 250 MCG/5ML IJ SOLN
INTRAMUSCULAR | Status: AC
  Filled 2022-11-14: qty 5

## 2022-11-14 MED ORDER — SENNOSIDES-DOCUSATE SODIUM 8.6-50 MG PO TABS: 1.0000 | ORAL_TABLET | Freq: Every evening | ORAL | Status: DC | PRN

## 2022-11-14 MED ORDER — PHENYLEPHRINE 80 MCG/ML (10ML) SYRINGE FOR IV PUSH (FOR BLOOD PRESSURE SUPPORT)
PREFILLED_SYRINGE | INTRAVENOUS | Status: DC | PRN
  Administered 2022-11-14: 80 ug via INTRAVENOUS
  Administered 2022-11-14 (×2): 40 ug via INTRAVENOUS

## 2022-11-14 MED ORDER — ROCURONIUM BROMIDE 10 MG/ML (PF) SYRINGE
PREFILLED_SYRINGE | INTRAVENOUS | Status: DC | PRN
  Administered 2022-11-14: 60 mg via INTRAVENOUS
  Administered 2022-11-14: 20 mg via INTRAVENOUS

## 2022-11-14 MED ORDER — CEFAZOLIN SODIUM-DEXTROSE 2-4 GM/100ML-% IV SOLN
2.0000 g | INTRAVENOUS | Status: AC
  Administered 2022-11-14: 2 g via INTRAVENOUS
  Filled 2022-11-14: qty 100

## 2022-11-14 MED ORDER — BUPIVACAINE HCL (PF) 0.5 % IJ SOLN
INTRAMUSCULAR | Status: DC | PRN
  Administered 2022-11-14: 15 mL

## 2022-11-14 MED ORDER — LOSARTAN POTASSIUM 50 MG PO TABS
25.0000 mg | ORAL_TABLET | Freq: Every day | ORAL | Status: DC
  Administered 2022-11-15: 25 mg via ORAL
  Filled 2022-11-14: qty 1

## 2022-11-14 MED ORDER — ROCURONIUM BROMIDE 10 MG/ML (PF) SYRINGE
PREFILLED_SYRINGE | INTRAVENOUS | Status: AC
  Filled 2022-11-14: qty 10

## 2022-11-14 MED ORDER — TRANEXAMIC ACID-NACL 1000-0.7 MG/100ML-% IV SOLN
1000.0000 mg | INTRAVENOUS | Status: AC
  Administered 2022-11-14: 1000 mg via INTRAVENOUS

## 2022-11-14 MED ORDER — MENTHOL 3 MG MT LOZG: 1.0000 | LOZENGE | OROMUCOSAL | Status: DC | PRN

## 2022-11-14 MED ORDER — ACETAMINOPHEN 500 MG PO TABS
500.0000 mg | ORAL_TABLET | Freq: Two times a day (BID) | ORAL | Status: DC
  Administered 2022-11-14 – 2022-11-15 (×2): 500 mg via ORAL
  Filled 2022-11-14 (×2): qty 1

## 2022-11-14 MED ORDER — CARVEDILOL 3.125 MG PO TABS
3.1250 mg | ORAL_TABLET | Freq: Two times a day (BID) | ORAL | Status: DC
  Administered 2022-11-14 – 2022-11-15 (×2): 3.125 mg via ORAL
  Filled 2022-11-14 (×2): qty 1

## 2022-11-14 MED ORDER — MIDAZOLAM HCL 2 MG/2ML IJ SOLN
INTRAMUSCULAR | Status: DC | PRN
  Administered 2022-11-14: 2 mg via INTRAVENOUS

## 2022-11-14 MED ORDER — PHENYLEPHRINE 80 MCG/ML (10ML) SYRINGE FOR IV PUSH (FOR BLOOD PRESSURE SUPPORT)
PREFILLED_SYRINGE | INTRAVENOUS | Status: AC
  Filled 2022-11-14: qty 10

## 2022-11-14 MED ORDER — PROPOFOL 10 MG/ML IV BOLUS
INTRAVENOUS | Status: DC | PRN
  Administered 2022-11-14: 120 mg via INTRAVENOUS

## 2022-11-14 MED ORDER — ESCITALOPRAM OXALATE 10 MG PO TABS
20.0000 mg | ORAL_TABLET | Freq: Every day | ORAL | Status: DC
  Administered 2022-11-15: 20 mg via ORAL
  Filled 2022-11-14: qty 2

## 2022-11-14 MED ORDER — NEOSTIGMINE METHYLSULFATE 3 MG/3ML IV SOSY
PREFILLED_SYRINGE | INTRAVENOUS | Status: AC
  Filled 2022-11-14: qty 3

## 2022-11-14 MED ORDER — AMISULPRIDE (ANTIEMETIC) 5 MG/2ML IV SOLN
INTRAVENOUS | Status: AC
  Filled 2022-11-14: qty 2

## 2022-11-14 MED ORDER — DEXAMETHASONE SODIUM PHOSPHATE 10 MG/ML IJ SOLN
INTRAMUSCULAR | Status: AC
  Filled 2022-11-14: qty 1

## 2022-11-14 MED ORDER — FENTANYL CITRATE (PF) 250 MCG/5ML IJ SOLN
INTRAMUSCULAR | Status: DC | PRN
  Administered 2022-11-14 (×2): 50 ug via INTRAVENOUS
  Administered 2022-11-14 (×2): 25 ug via INTRAVENOUS

## 2022-11-14 MED ORDER — LIDOCAINE 2% (20 MG/ML) 5 ML SYRINGE
INTRAMUSCULAR | Status: AC
  Filled 2022-11-14: qty 5

## 2022-11-14 MED ORDER — GLYCOPYRROLATE PF 0.2 MG/ML IJ SOSY
PREFILLED_SYRINGE | INTRAMUSCULAR | Status: AC
  Filled 2022-11-14: qty 3

## 2022-11-14 MED ORDER — ONDANSETRON HCL 4 MG PO TABS: 4.0000 mg | ORAL_TABLET | Freq: Four times a day (QID) | ORAL | Status: DC | PRN

## 2022-11-14 MED ORDER — PHENOL 1.4 % MT LIQD: 1.0000 | OROMUCOSAL | Status: DC | PRN

## 2022-11-14 MED ORDER — ACETAMINOPHEN 500 MG PO TABS
1000.0000 mg | ORAL_TABLET | Freq: Once | ORAL | Status: AC
  Administered 2022-11-14: 1000 mg via ORAL
  Filled 2022-11-14: qty 2

## 2022-11-14 MED ORDER — MIDAZOLAM HCL 2 MG/2ML IJ SOLN
INTRAMUSCULAR | Status: AC
  Filled 2022-11-14: qty 2

## 2022-11-14 MED ORDER — ONDANSETRON HCL 4 MG/2ML IJ SOLN
INTRAMUSCULAR | Status: AC
  Filled 2022-11-14: qty 2

## 2022-11-14 MED ORDER — ALPRAZOLAM 0.25 MG PO TABS
0.2500 mg | ORAL_TABLET | Freq: Two times a day (BID) | ORAL | Status: DC | PRN
  Administered 2022-11-14: 0.25 mg via ORAL
  Filled 2022-11-14: qty 1

## 2022-11-14 MED ORDER — ONDANSETRON HCL 4 MG/2ML IJ SOLN
INTRAMUSCULAR | Status: DC | PRN
  Administered 2022-11-14: 4 mg via INTRAVENOUS

## 2022-11-14 MED ORDER — ORAL CARE MOUTH RINSE: 15.0000 mL | Freq: Once | OROMUCOSAL | Status: AC

## 2022-11-14 MED ORDER — FENTANYL CITRATE (PF) 100 MCG/2ML IJ SOLN: 25.0000 ug | INTRAMUSCULAR | Status: DC | PRN

## 2022-11-14 MED ORDER — PANTOPRAZOLE SODIUM 40 MG PO TBEC
40.0000 mg | DELAYED_RELEASE_TABLET | Freq: Every day | ORAL | Status: DC
  Administered 2022-11-15: 40 mg via ORAL
  Filled 2022-11-14: qty 1

## 2022-11-14 MED ORDER — MIDAZOLAM HCL 2 MG/2ML IJ SOLN
INTRAMUSCULAR | Status: AC
  Administered 2022-11-14: 2 mg
  Filled 2022-11-14: qty 2

## 2022-11-14 MED ORDER — PHENYLEPHRINE HCL-NACL 20-0.9 MG/250ML-% IV SOLN
INTRAVENOUS | Status: DC | PRN
  Administered 2022-11-14: 25 ug/min via INTRAVENOUS

## 2022-11-14 MED ORDER — 0.9 % SODIUM CHLORIDE (POUR BTL) OPTIME
TOPICAL | Status: DC | PRN
  Administered 2022-11-14: 1000 mL

## 2022-11-14 MED ORDER — DEXMEDETOMIDINE HCL IN NACL 80 MCG/20ML IV SOLN
INTRAVENOUS | Status: AC
  Filled 2022-11-14: qty 20

## 2022-11-14 MED ORDER — VITAMIN D 25 MCG (1000 UNIT) PO TABS
2000.0000 [IU] | ORAL_TABLET | Freq: Two times a day (BID) | ORAL | Status: DC
  Administered 2022-11-14 – 2022-11-15 (×2): 2000 [IU] via ORAL
  Filled 2022-11-14 (×2): qty 2

## 2022-11-14 MED ORDER — FENTANYL CITRATE (PF) 100 MCG/2ML IJ SOLN
INTRAMUSCULAR | Status: AC
  Administered 2022-11-14: 100 ug
  Filled 2022-11-14: qty 2

## 2022-11-14 MED ORDER — LACTATED RINGERS IV SOLN: INTRAVENOUS | Status: DC

## 2022-11-14 MED ORDER — ATORVASTATIN CALCIUM 10 MG PO TABS
20.0000 mg | ORAL_TABLET | Freq: Every day | ORAL | Status: DC
  Administered 2022-11-15: 20 mg via ORAL
  Filled 2022-11-14: qty 2

## 2022-11-14 MED ORDER — LIDOCAINE 2% (20 MG/ML) 5 ML SYRINGE
INTRAMUSCULAR | Status: DC | PRN
  Administered 2022-11-14: 100 mg via INTRAVENOUS

## 2022-11-14 MED ORDER — NEOSTIGMINE METHYLSULFATE 3 MG/3ML IV SOSY
PREFILLED_SYRINGE | INTRAVENOUS | Status: DC | PRN
  Administered 2022-11-14 (×3): 1 mg via INTRAVENOUS

## 2022-11-14 MED ORDER — HYDROCODONE-ACETAMINOPHEN 5-325 MG PO TABS: 1.0000 | ORAL_TABLET | ORAL | Status: DC | PRN

## 2022-11-14 MED ORDER — PROMETHAZINE HCL 25 MG/ML IJ SOLN: 6.2500 mg | INTRAMUSCULAR | Status: DC | PRN

## 2022-11-14 MED ORDER — CEFAZOLIN SODIUM-DEXTROSE 1-4 GM/50ML-% IV SOLN
1.0000 g | Freq: Four times a day (QID) | INTRAVENOUS | Status: AC
  Administered 2022-11-14 – 2022-11-15 (×3): 1 g via INTRAVENOUS
  Filled 2022-11-14 (×3): qty 50

## 2022-11-14 MED ORDER — ONDANSETRON HCL 4 MG/2ML IJ SOLN: 4.0000 mg | Freq: Four times a day (QID) | INTRAMUSCULAR | Status: DC | PRN

## 2022-11-14 MED ORDER — AMISULPRIDE (ANTIEMETIC) 5 MG/2ML IV SOLN
10.0000 mg | Freq: Once | INTRAVENOUS | Status: AC | PRN
  Administered 2022-11-14: 10 mg via INTRAVENOUS

## 2022-11-14 MED ORDER — PROPOFOL 10 MG/ML IV BOLUS
INTRAVENOUS | Status: AC
  Filled 2022-11-14: qty 20

## 2022-11-14 MED ORDER — MORPHINE SULFATE (PF) 2 MG/ML IV SOLN: 0.5000 mg | INTRAVENOUS | Status: DC | PRN

## 2022-11-14 MED ORDER — BUPIVACAINE LIPOSOME 1.3 % IJ SUSP
INTRAMUSCULAR | Status: DC | PRN
  Administered 2022-11-14: 10 mL via PERINEURAL

## 2022-11-14 MED ORDER — CHLORHEXIDINE GLUCONATE 0.12 % MT SOLN
15.0000 mL | Freq: Once | OROMUCOSAL | Status: AC
  Administered 2022-11-14: 15 mL via OROMUCOSAL

## 2022-11-14 MED ORDER — METOCLOPRAMIDE HCL 5 MG/ML IJ SOLN: 5.0000 mg | Freq: Three times a day (TID) | INTRAMUSCULAR | Status: DC | PRN

## 2022-11-14 MED ORDER — GLYCOPYRROLATE PF 0.2 MG/ML IJ SOSY
PREFILLED_SYRINGE | INTRAMUSCULAR | Status: DC | PRN
  Administered 2022-11-14 (×3): .2 mg via INTRAVENOUS

## 2022-11-14 MED ORDER — VANCOMYCIN HCL 1000 MG IV SOLR
INTRAVENOUS | Status: DC | PRN
  Administered 2022-11-14: 1000 mg via TOPICAL

## 2022-11-14 MED ORDER — ACETAMINOPHEN 325 MG PO TABS: 325.0000 mg | ORAL_TABLET | Freq: Four times a day (QID) | ORAL | Status: DC | PRN

## 2022-11-14 MED ORDER — ASPIRIN 325 MG PO TBEC
325.0000 mg | DELAYED_RELEASE_TABLET | Freq: Every day | ORAL | Status: DC
  Filled 2022-11-14: qty 1

## 2022-11-14 MED ORDER — METOCLOPRAMIDE HCL 5 MG PO TABS: 5.0000 mg | ORAL_TABLET | Freq: Three times a day (TID) | ORAL | Status: DC | PRN

## 2022-11-14 MED ORDER — DEXAMETHASONE SODIUM PHOSPHATE 10 MG/ML IJ SOLN
INTRAMUSCULAR | Status: DC | PRN
  Administered 2022-11-14: 10 mg via INTRAVENOUS

## 2022-11-14 MED ORDER — ZOLPIDEM TARTRATE 5 MG PO TABS
5.0000 mg | ORAL_TABLET | Freq: Every evening | ORAL | Status: DC | PRN
  Administered 2022-11-15: 5 mg via ORAL
  Filled 2022-11-14: qty 1

## 2022-11-14 SURGICAL SUPPLY — 94 items
BAG COUNTER SPONGE SURGICOUNT (BAG) ×1 IMPLANT
BENZOIN TINCTURE PRP APPL 2/3 (GAUZE/BANDAGES/DRESSINGS) ×2 IMPLANT
BIT DRILL 3.2 (BIT) ×1
BIT DRILL 3.2XCALB NS DISP (BIT) IMPLANT
BIT DRILL CALIBRATED 2.7 (BIT) IMPLANT
BIT DRL 3.2XCALB NS DISP (BIT) ×1
BNDG GAUZE DERMACEA FLUFF 4 (GAUZE/BANDAGES/DRESSINGS) ×2 IMPLANT
BONE CANC CHIPS 20CC PCAN1/4 (Bone Implant) ×1 IMPLANT
BRUSH SCRUB EZ PLAIN DRY (MISCELLANEOUS) ×2 IMPLANT
CHIPS CANC BONE 20CC PCAN1/4 (Bone Implant) ×1 IMPLANT
COVER SURGICAL LIGHT HANDLE (MISCELLANEOUS) ×2 IMPLANT
DRAPE C-ARM 42X72 X-RAY (DRAPES) ×1 IMPLANT
DRAPE C-ARMOR (DRAPES) ×1 IMPLANT
DRAPE IMP U-DRAPE 54X76 (DRAPES) ×1 IMPLANT
DRAPE INCISE IOBAN 66X45 STRL (DRAPES) IMPLANT
DRAPE ORTHO SPLIT 77X108 STRL (DRAPES) ×2
DRAPE SURG 17X11 SM STRL (DRAPES) ×2 IMPLANT
DRAPE SURG ORHT 6 SPLT 77X108 (DRAPES) ×2 IMPLANT
DRAPE U-SHAPE 47X51 STRL (DRAPES) ×2 IMPLANT
DRSG ADAPTIC 3X8 NADH LF (GAUZE/BANDAGES/DRESSINGS) ×1 IMPLANT
DRSG MEPILEX POST OP 4X12 (GAUZE/BANDAGES/DRESSINGS) IMPLANT
DRSG MEPITEL 4X7.2 (GAUZE/BANDAGES/DRESSINGS) IMPLANT
ELECT REM PT RETURN 9FT ADLT (ELECTROSURGICAL) ×1
ELECTRODE REM PT RTRN 9FT ADLT (ELECTROSURGICAL) ×1 IMPLANT
EVACUATOR 1/8 PVC DRAIN (DRAIN) IMPLANT
GAUZE PAD ABD 8X10 STRL (GAUZE/BANDAGES/DRESSINGS) ×1 IMPLANT
GAUZE SPONGE 4X4 12PLY STRL (GAUZE/BANDAGES/DRESSINGS) ×2 IMPLANT
GLOVE BIO SURGEON STRL SZ7 (GLOVE) IMPLANT
GLOVE BIO SURGEON STRL SZ7.5 (GLOVE) ×1 IMPLANT
GLOVE BIO SURGEON STRL SZ8 (GLOVE) ×1 IMPLANT
GLOVE BIOGEL PI IND STRL 7.0 (GLOVE) IMPLANT
GLOVE BIOGEL PI IND STRL 7.5 (GLOVE) ×1 IMPLANT
GLOVE BIOGEL PI IND STRL 8 (GLOVE) ×1 IMPLANT
GLOVE ORTHO TXT STRL SZ7.5 (GLOVE) IMPLANT
GLOVE SURG ORTHO LTX SZ7.5 (GLOVE) ×2 IMPLANT
GLOVE SURG SS PI 6.5 STRL IVOR (GLOVE) IMPLANT
GOWN STRL REUS W/ TWL LRG LVL3 (GOWN DISPOSABLE) ×2 IMPLANT
GOWN STRL REUS W/ TWL XL LVL3 (GOWN DISPOSABLE) ×1 IMPLANT
GOWN STRL REUS W/TWL 2XL LVL3 (GOWN DISPOSABLE) ×1 IMPLANT
GOWN STRL REUS W/TWL LRG LVL3 (GOWN DISPOSABLE) ×2
GOWN STRL REUS W/TWL XL LVL3 (GOWN DISPOSABLE) ×3
GRAFT BNE CANC CHIPS 1-8 20CC (Bone Implant) IMPLANT
K-WIRE 2X5 SS THRDED S3 (WIRE) ×5
KIT BASIN OR (CUSTOM PROCEDURE TRAY) ×1 IMPLANT
KIT TURNOVER KIT B (KITS) ×1 IMPLANT
KWIRE 2X5 SS THRDED S3 (WIRE) IMPLANT
LOOP VASCLR MAXI BLUE 18IN ST (MISCELLANEOUS) IMPLANT
LOOP VASCULAR MAXI 18 BLUE (MISCELLANEOUS)
LOOPS VASCLR MAXI BLUE 18IN ST (MISCELLANEOUS) IMPLANT
MANIFOLD NEPTUNE II (INSTRUMENTS) ×1 IMPLANT
NDL SUT 6 .5 CRC .975X.05 MAYO (NEEDLE) IMPLANT
NEEDLE MAYO TAPER (NEEDLE) ×1
NS IRRIG 1000ML POUR BTL (IV SOLUTION) ×1 IMPLANT
PACK TOTAL JOINT (CUSTOM PROCEDURE TRAY) ×1 IMPLANT
PACK UNIVERSAL I (CUSTOM PROCEDURE TRAY) ×1 IMPLANT
PAD ARMBOARD 7.5X6 YLW CONV (MISCELLANEOUS) ×2 IMPLANT
PEG LOCKING 3.2X34 (Screw) IMPLANT
PEG LOCKING 3.2X38 (Screw) IMPLANT
PEG LOCKING 3.2X40 (Peg) IMPLANT
PEG LOCKING 3.2X48 (Peg) IMPLANT
PEG LOCKING 3.2X52 (Peg) IMPLANT
PLATE PROX HUM HI L 7H 140 (Plate) IMPLANT
SCREW LOCK CORT STAR 3.5X24 (Screw) IMPLANT
SCREW LP NL T15 3.5X20 (Screw) IMPLANT
SCREW LP NL T15 3.5X22 (Screw) IMPLANT
SCREW LP NL T15 3.5X26 (Screw) IMPLANT
SCREW T15 LP CORT 3.5X40MM NS (Screw) IMPLANT
SCREW T15 LP CORT 3.5X44MM NS (Screw) IMPLANT
SLING ARM FOAM STRAP MED (SOFTGOODS) IMPLANT
SOL PREP POV-IOD 4OZ 10% (MISCELLANEOUS) IMPLANT
SOL SCRUB PVP POV-IOD 4OZ 7.5% (MISCELLANEOUS) ×1
SOLUTION SCRB POV-IOD 4OZ 7.5% (MISCELLANEOUS) IMPLANT
SPONGE T-LAP 18X18 ~~LOC~~+RFID (SPONGE) IMPLANT
STAPLER VISISTAT 35W (STAPLE) ×1 IMPLANT
STOCKINETTE IMPERVIOUS LG (DRAPES) ×1 IMPLANT
SUCTION FRAZIER HANDLE 10FR (MISCELLANEOUS) ×1
SUCTION TUBE FRAZIER 10FR DISP (MISCELLANEOUS) ×1 IMPLANT
SUT ETHIBOND 5 LR DA (SUTURE) ×1 IMPLANT
SUT ETHILON 2 0 PSLX (SUTURE) IMPLANT
SUT FIBERWIRE #2 38 T-5 BLUE (SUTURE) ×2
SUT PDS AB 2-0 CT1 27 (SUTURE) IMPLANT
SUT VIC AB 0 CT1 27 (SUTURE) ×2
SUT VIC AB 0 CT1 27XBRD ANBCTR (SUTURE) ×2 IMPLANT
SUT VIC AB 2-0 CT1 27 (SUTURE) ×2
SUT VIC AB 2-0 CT1 TAPERPNT 27 (SUTURE) ×2 IMPLANT
SUT VIC AB 2-0 CT3 27 (SUTURE) IMPLANT
SUTURE FIBERWR #2 38 T-5 BLUE (SUTURE) IMPLANT
SYR 5ML LL (SYRINGE) IMPLANT
TOWEL GREEN STERILE (TOWEL DISPOSABLE) ×2 IMPLANT
TOWEL GREEN STERILE FF (TOWEL DISPOSABLE) ×1 IMPLANT
TRAY FOLEY MTR SLVR 16FR STAT (SET/KITS/TRAYS/PACK) IMPLANT
VASCULAR TIE MAXI BLUE 18IN ST (MISCELLANEOUS)
WATER STERILE IRR 1000ML POUR (IV SOLUTION) ×1 IMPLANT
YANKAUER SUCT BULB TIP NO VENT (SUCTIONS) IMPLANT

## 2022-11-14 NOTE — Anesthesia Procedure Notes (Signed)
Anesthesia Regional Block: Interscalene brachial plexus block   Pre-Anesthetic Checklist: , timeout performed,  Correct Patient, Correct Site, Correct Laterality,  Correct Procedure, Correct Position, site marked,  Risks and benefits discussed,  Surgical consent,  Pre-op evaluation,  At surgeon's request and post-op pain management  Laterality: Right  Prep: chloraprep       Needles:  Injection technique: Single-shot  Needle Type: Echogenic Stimulator Needle     Needle Length: 5cm  Needle Gauge: 22     Additional Needles:   Narrative:  Start time: 11/14/2022 10:02 AM End time: 11/14/2022 10:12 AM Injection made incrementally with aspirations every 5 mL.  Performed by: Personally  Anesthesiologist: Duane Boston, MD  Additional Notes: Functioning IV was confirmed and monitors applied.  A 66mm 22ga echogenic arrow stimulator was used. Sterile prep and drape,hand hygiene and sterile gloves were used.Ultrasound guidance: relevant anatomy identified, needle position confirmed, local anesthetic spread visualized around nerve(s)., vascular puncture avoided.  Image printed for medical record.  Negative aspiration and negative test dose prior to incremental administration of local anesthetic. The patient tolerated the procedure well.

## 2022-11-14 NOTE — Op Note (Addendum)
11/14/2022  4:03 PM  PATIENT:  Lydia Elliott  72 y.o. female  PRE-OPERATIVE DIAGNOSIS:   1. LEFT HUMERUS FRACTURE, 4 part HEAD AND NECK 2. LEFT HUMERAL SHAFT FRACTURE  POST-OPERATIVE DIAGNOSIS:   1. LEFT HUMERUS FRACTURE, 4 part HEAD AND NECK 2. LEFT HUMERAL SHAFT FRACTURE  PROCEDURE:   1. OPEN REDUCTION INTERNAL FIXATION OF LEFT PROXIMAL HUMERUS HEAD AND NECK FRACTURE 2. OPEN REDUCTION INTERNAL FIXATION OF LEFT HUMERAL SHAFT FRACTURE  SURGEON:  Myrene Galas, MD  PHYSICIAN ASSISTANT: Montez Morita, PA-C  ANESTHESIA:   GENERAL   I/O:  Total I/O In: 1000 [I.V.:1000] Out: 150 [Blood:150]  SPECIMEN:  NONE  TOURNIQUET:  NONE  COMPLICATIONS: NONE  DICTATION: Written in EPIC  DISPOSITION: TO PACU  CONDITION: STABLE  DELAY START OF DVT PROPHYLAXIS BECAUSE OF BLEEDING RISK: NO  BRIEF SUMMARY OF INDICATION FOR PROCEDURE:  Lydia Elliott is a right-hand dominant 72 y.o. who sustained shoulder injury in a ground level fall. In order to most reliably restore function and reduce pain I recommended surgical repair.  I discussed with the patient risks of heart attack, stroke, infection, malunion, nonunion, loss of motion, DVT/ PE, loss of reduction, avascular necrosis, screw penetration, and need for further surgery, among others. These risks were acknowledged and consent provided to proceed.  BRIEF SUMMARY OF PROCEDURE:  After preoperative antibiotics, the patient was taken to the operating room where general anesthesia was induced. Chlorhexidene wash and betadine scrub and paint were performed then standard draping. The deltopectoral approach was made after time-out.  Dissection was carried down to the interval where the superior lateral edge of the pec insertion was mobilized as well as the more medial anterior edge of the deltoid.  I was able to mobilize the head segment and identify the greater and lesser tuberosity segments after removal of the bursa. Hematoma was evacuated with  curettes and lavage. The tuberosities were secured with Bon[place] Dolores sutures for later repair to each other and the plate and shaft. Using the long head of the biceps and the bicipital groove as landmarks, reduction maneuvers were performed while keeping the periostial attachments to all the fragments.  Pins were used provisionally followed by the plate with additional pins for further fixation. Unfortunately the humeral shaft was fracture as well with spider extensions extending significantly down. I had to trade out for a much longer plate and then resecure the shaft in proper alignment. C-arm confirmed appropriate alignment and reduction with goals for restoration of proper head shaft orientation and tuberosity position.  This was followed by a screw fixation into the shaft in the slotted hole and then standard fixation into the head, which worked to appose the plate to the bone, although it was not completely flush in all areas. The reduction of the humeral head and shaft was excellent and so we proceeded with additional fixation, including suture fixation of the tuberosities. Locked pegs were placed into the head and primarily standard screws into the shaft.  Final images showed appropriate reduction, hardware trajectory, and length. Outstanding clinical motion was feasible on the table, including abduction, and internal and external rotation. My assistant, Montez Morita, was present and assisting throughout.  An assistant was absolutely necessary for safe and effective completion as the reduction had to be held during provisional and definitive fixation.  Lastly, some bone graft was placed along the fragments anteromedially where there was particular comminution in the shaft and associated bone loss extending up into the surgical neck. The wound was thoroughly  irrigated and then closed in a standard layered fashion using #1 Vicryl to reapproximate the superior edge of the pec and anterior edge of  the delt and then 0 for reapproximation of the muscular interval, 2-0 Vicryl and nylon for the skin.  Sterile gently compressive dressing was applied and a sling with an Ace from hand to upper arm.  The patient was taken to PACU in stable condition.  PROGNOSIS:  Lydia CoveMarion Bouwens will have unrestricted gentle passive range of motion of the operative shoulder at this time with active elbow, wrist, and digital motion.  We will likely resume weightbearing at 6 weeks.  Bone quality was low and consistent with osteoporosis, which is also significant with regard to future fractures/ fall risk.     Doralee AlbinoMichael H. Carola FrostHandy, M.D.

## 2022-11-14 NOTE — Anesthesia Procedure Notes (Signed)
Procedure Name: Intubation Date/Time: 11/14/2022 12:50 PM  Performed by: Carolan Clines, CRNAPre-anesthesia Checklist: Patient identified, Emergency Drugs available, Suction available and Patient being monitored Patient Re-evaluated:Patient Re-evaluated prior to induction Oxygen Delivery Method: Circle System Utilized Preoxygenation: Pre-oxygenation with 100% oxygen Induction Type: IV induction Ventilation: Mask ventilation without difficulty and Oral airway inserted - appropriate to patient size Laryngoscope Size: Mac and 3 Grade View: Grade I Tube type: Oral Tube size: 7.0 mm Number of attempts: 1 Airway Equipment and Method: Stylet and Oral airway Placement Confirmation: ETT inserted through vocal cords under direct vision, positive ETCO2 and breath sounds checked- equal and bilateral Secured at: 22 cm Tube secured with: Tape Dental Injury: Teeth and Oropharynx as per pre-operative assessment

## 2022-11-14 NOTE — Anesthesia Preprocedure Evaluation (Addendum)
Anesthesia Evaluation  Patient identified by MRN, date of birth, ID band Patient awake    Reviewed: Allergy & Precautions, NPO status , Patient's Chart, lab work & pertinent test results  History of Anesthesia Complications Negative for: history of anesthetic complications  Airway Mallampati: II  TM Distance: >3 FB     Dental no notable dental hx. (+) Dental Advisory Given, Caps   Pulmonary former smoker   Pulmonary exam normal breath sounds clear to auscultation       Cardiovascular hypertension, Pt. on medications and Pt. on home beta blockers + CAD and + Cardiac Stents  Normal cardiovascular exam Rhythm:Regular Rate:Normal  TTE 03/2023 INTERPRETATION  NORMAL LEFT VENTRICULAR SYSTOLIC FUNCTION   WITH MILD LVH  NORMAL RIGHT VENTRICULAR SYSTOLIC FUNCTION  MILD VALVULAR REGURGITATION (See above)  NO VALVULAR STENOSIS  MILD AR, TR  TRIVIAL MR  EF 55%  Closest EF: >55% (Estimated)  LVH: MILD LVH  Aortic: MILD AR  Mitral: TRIVIAL MR  Tricuspid: MILD TR     Neuro/Psych  PSYCHIATRIC DISORDERS Anxiety Depression    negative neurological ROS     GI/Hepatic Neg liver ROS,GERD  Medicated,,  Endo/Other  negative endocrine ROS    Renal/GU negative Renal ROS     Musculoskeletal negative musculoskeletal ROS (+)    Abdominal   Peds  Hematology  (+) Blood dyscrasia, anemia   Anesthesia Other Findings   Reproductive/Obstetrics                             Anesthesia Physical Anesthesia Plan  ASA: 3  Anesthesia Plan: General   Post-op Pain Management: Tylenol PO (pre-op)*, Regional block* and Toradol IV (intra-op)*   Induction: Intravenous  PONV Risk Score and Plan: 3 and Ondansetron, Dexamethasone and Diphenhydramine  Airway Management Planned: Oral ETT  Additional Equipment:   Intra-op Plan:   Post-operative Plan: Extubation in OR  Informed Consent: I have reviewed the patients  History and Physical, chart, labs and discussed the procedure including the risks, benefits and alternatives for the proposed anesthesia with the patient or authorized representative who has indicated his/her understanding and acceptance.     Dental advisory given  Plan Discussed with: Anesthesiologist and CRNA  Anesthesia Plan Comments:        Anesthesia Quick Evaluation

## 2022-11-14 NOTE — Plan of Care (Signed)
  Problem: Education: Goal: Knowledge of the prescribed therapeutic regimen will improve Outcome: Progressing   Problem: Pain Management: Goal: Pain level will decrease with appropriate interventions Outcome: Progressing   Problem: Education: Goal: Knowledge of General Education information will improve Description: Including pain rating scale, medication(s)/side effects and non-pharmacologic comfort measures Outcome: Progressing   Problem: Activity: Goal: Risk for activity intolerance will decrease Outcome: Progressing   Problem: Pain Managment: Goal: General experience of comfort will improve Outcome: Progressing   Problem: Safety: Goal: Ability to remain free from injury will improve Outcome: Progressing   Problem: Skin Integrity: Goal: Risk for impaired skin integrity will decrease Outcome: Progressing

## 2022-11-14 NOTE — Anesthesia Postprocedure Evaluation (Signed)
Anesthesia Post Note  Patient: Lydia Elliott  Procedure(s) Performed: OPEN REDUCTION INTERNAL FIXATION (ORIF) PROXIMAL HUMERUS FRACTURE (Left)     Patient location during evaluation: PACU Anesthesia Type: General Level of consciousness: awake and alert and oriented Pain management: pain level controlled Vital Signs Assessment: post-procedure vital signs reviewed and stable Respiratory status: spontaneous breathing, nonlabored ventilation and respiratory function stable Cardiovascular status: blood pressure returned to baseline and stable Postop Assessment: no apparent nausea or vomiting Anesthetic complications: no   No notable events documented.  Last Vitals:  Vitals:   11/14/22 1615 11/14/22 1620  BP: (!) 135/54   Pulse: 70 69  Resp: 20 16  Temp:    SpO2: 95% 93%    Last Pain:  Vitals:   11/14/22 1615  TempSrc:   PainSc: 0-No pain                 Ignazio Kincaid A.

## 2022-11-14 NOTE — Transfer of Care (Signed)
Immediate Anesthesia Transfer of Care Note  Patient: Lydia Elliott  Procedure(s) Performed: OPEN REDUCTION INTERNAL FIXATION (ORIF) PROXIMAL HUMERUS FRACTURE (Left)  Patient Location: PACU  Anesthesia Type:GA combined with regional for post-op pain  Level of Consciousness: awake, alert , and oriented  Airway & Oxygen Therapy: Patient Spontanous Breathing and Patient connected to nasal cannula oxygen  Post-op Assessment: Report given to RN, Post -op Vital signs reviewed and stable, and Patient able to stick tongue midline  Post vital signs: Reviewed  Last Vitals:  Vitals Value Taken Time  BP 137/52 11/14/22 1541  Temp 97.8   Pulse 97 11/14/22 1544  Resp 24 11/14/22 1544  SpO2 93 % 11/14/22 1544  Vitals shown include unvalidated device data.  Last Pain:  Vitals:   11/14/22 0811  TempSrc: Oral         Complications: No notable events documented.

## 2022-11-15 ENCOUNTER — Other Ambulatory Visit (HOSPITAL_COMMUNITY): Payer: Self-pay

## 2022-11-15 ENCOUNTER — Encounter (HOSPITAL_COMMUNITY): Payer: Self-pay | Admitting: Orthopedic Surgery

## 2022-11-15 DIAGNOSIS — S42202A Unspecified fracture of upper end of left humerus, initial encounter for closed fracture: Secondary | ICD-10-CM | POA: Diagnosis not present

## 2022-11-15 DIAGNOSIS — M810 Age-related osteoporosis without current pathological fracture: Secondary | ICD-10-CM | POA: Diagnosis present

## 2022-11-15 DIAGNOSIS — E559 Vitamin D deficiency, unspecified: Secondary | ICD-10-CM

## 2022-11-15 HISTORY — DX: Age-related osteoporosis without current pathological fracture: M81.0

## 2022-11-15 HISTORY — DX: Vitamin D deficiency, unspecified: E55.9

## 2022-11-15 LAB — CBC
HCT: 28 % — ABNORMAL LOW (ref 36.0–46.0)
Hemoglobin: 8.1 g/dL — ABNORMAL LOW (ref 12.0–15.0)
MCH: 20.4 pg — ABNORMAL LOW (ref 26.0–34.0)
MCHC: 28.9 g/dL — ABNORMAL LOW (ref 30.0–36.0)
MCV: 70.5 fL — ABNORMAL LOW (ref 80.0–100.0)
Platelets: 254 10*3/uL (ref 150–400)
RBC: 3.97 MIL/uL (ref 3.87–5.11)
RDW: 16.7 % — ABNORMAL HIGH (ref 11.5–15.5)
WBC: 8.9 10*3/uL (ref 4.0–10.5)
nRBC: 0.3 % — ABNORMAL HIGH (ref 0.0–0.2)

## 2022-11-15 LAB — HEMOGLOBIN A1C
Hgb A1c MFr Bld: 5.5 % (ref 4.8–5.6)
Mean Plasma Glucose: 111 mg/dL

## 2022-11-15 MED ORDER — VITAMIN C 1000 MG PO TABS
1000.0000 mg | ORAL_TABLET | Freq: Every day | ORAL | 2 refills | Status: DC
  Filled 2022-11-15: qty 30, 30d supply, fill #0

## 2022-11-15 MED ORDER — CHOLECALCIFEROL 125 MCG (5000 UT) PO TABS
1.0000 | ORAL_TABLET | Freq: Every day | ORAL | 6 refills | Status: DC
  Filled 2022-11-15: qty 30, 30d supply, fill #0

## 2022-11-15 MED ORDER — DOCUSATE SODIUM 100 MG PO CAPS
100.0000 mg | ORAL_CAPSULE | Freq: Two times a day (BID) | ORAL | 0 refills | Status: DC
  Filled 2022-11-15: qty 10, 5d supply, fill #0

## 2022-11-15 MED ORDER — HYDROCODONE-ACETAMINOPHEN 5-325 MG PO TABS
1.0000 | ORAL_TABLET | Freq: Three times a day (TID) | ORAL | 0 refills | Status: DC | PRN
  Filled 2022-11-15: qty 40, 7d supply, fill #0

## 2022-11-15 MED ORDER — ACETAMINOPHEN 500 MG PO TABS
500.0000 mg | ORAL_TABLET | Freq: Two times a day (BID) | ORAL | 0 refills | Status: DC
  Filled 2022-11-15: qty 30, 15d supply, fill #0

## 2022-11-15 NOTE — Progress Notes (Signed)
Discharge instructions given. Patient verbalized understanding and all questions were answered.  ?

## 2022-11-15 NOTE — Evaluation (Signed)
Occupational Therapy Evaluation Patient Details Name: Lydia Elliott MRN: [id number] DOB: 01/01/1951 Today's Date: 11/15/2022   History of Present Illness Pt is a 72 y.o. female s/p ORIF L proximal humerus fx. PMH significant for aortic atherosclerosis, arthritis, CAD, GERD, HLD, HTN, R knee arthroscopy, anxiety, and depression.   Clinical Impression   PTA, pt recently receiving assist with ADL due to fracture, but prior to fracture, independent. Upon eval, pt requiring supervision for OOB mobility for safety and up to mod A for UB ADL. Pt educated and demonstrating use of compensatory techniques for bed mobility, sleep position, UB ADL, grooming, sling application within precautions. Reviewed precautions and exercises to be completed per physician orders. Recommending follow physicians orders for discharge.      Recommendations for follow up therapy are one component of a multi-disciplinary discharge planning process, led by the attending physician.  Recommendations may be updated based on patient status, additional functional criteria and insurance authorization.   Assistance Recommended at Discharge Intermittent Supervision/Assistance  Patient can return home with the following A little help with walking and/or transfers;A little help with bathing/dressing/bathroom;Assistance with cooking/housework;Assist for transportation;Help with stairs or ramp for entrance    Functional Status Assessment  Patient has had a recent decline in their functional status and demonstrates the ability to make significant improvements in function in a reasonable and predictable amount of time.  Equipment Recommendations  BSC/3in1    Recommendations for Other Services PT consult     Precautions / Restrictions Precautions Precautions: Shoulder Type of Shoulder Precautions: NWB; PROM shoulder to 90, abd to 60, ER to 30. OK for pendulums and AROM elbow/wrist/hand. Sling all times except ADL/exercise. Shoulder  Interventions: Shoulder sling/immobilizer;At all times Precaution Booklet Issued: Yes (comment) Precaution Comments: All precautions, exercises, and ADL reviewed Required Braces or Orthoses: Sling Restrictions Weight Bearing Restrictions: Yes LUE Weight Bearing: Non weight bearing      Mobility Bed Mobility Overal bed mobility: Needs Assistance Bed Mobility: Supine to Sit, Sit to Supine     Supine to sit: Supervision Sit to supine: Supervision   General bed mobility comments: Increased time    Transfers Overall transfer level: Needs assistance Equipment used: None Transfers: Sit to/from Stand, Bed to chair/wheelchair/BSC Sit to Stand: Supervision     Step pivot transfers: Supervision     General transfer comment: for safety      Balance Overall balance assessment: Mild deficits observed, not formally tested                                         ADL either performed or assessed with clinical judgement   ADL Overall ADL's : Needs assistance/impaired Eating/Feeding: Set up;Sitting   Grooming: Supervision/safety;Standing   Upper Body Bathing: Minimal assistance;Sitting   Lower Body Bathing: Supervison/ safety;Sit to/from stand   Upper Body Dressing : Moderate assistance;Sitting Upper Body Dressing Details (indicate cue type and reason): for donning shirt and sling Lower Body Dressing: Supervision/safety;Sit to/from stand   Toilet Transfer: Supervision/safety;Ambulation;Regular Teacher, adult education Details (indicate cue type and reason): increased time and effort for rise Toileting- Clothing Manipulation and Hygiene: Supervision/safety;Sitting/lateral lean   Tub/ Shower Transfer: Walk-in shower;Min guard;Ambulation   Functional mobility during ADLs: Supervision/safety;Min guard       Vision Baseline Vision/History: 0 No visual deficits Ability to See in Adequate Light: 0 Adequate Patient Visual Report: No change from baseline Vision  Assessment?: No apparent  visual deficits     Perception     Praxis      Pertinent Vitals/Pain Pain Assessment Pain Assessment: Faces Faces Pain Scale: Hurts a little bit Pain Location: L neck/shoulder musculature Pain Descriptors / Indicators: Discomfort Pain Intervention(s): Limited activity within patient's tolerance, Monitored during session, Repositioned     Hand Dominance Right   Extremity/Trunk Assessment Upper Extremity Assessment Upper Extremity Assessment: LUE deficits/detail LUE Deficits / Details: s/p surgery. Folowing precautions during session. Significant edema through hand. Nerve block still active above area of wrist   Lower Extremity Assessment Lower Extremity Assessment: Defer to PT evaluation       Communication Communication Communication: No difficulties   Cognition Arousal/Alertness: Awake/alert Behavior During Therapy: WFL for tasks assessed/performed Overall Cognitive Status: Within Functional Limits for tasks assessed                                       General Comments  VSS    Exercises Exercises: Shoulder Shoulder Exercises Pendulum Exercise: PROM, 10 reps, Left Shoulder Flexion: PROM, Left, 10 reps Shoulder ABduction: PROM, Left, 10 reps Shoulder External Rotation: PROM, Left, 10 reps Elbow Flexion: AAROM, Left, 10 reps Elbow Extension: AAROM, Left, 10 reps Wrist Flexion: AROM, Left, 10 reps Wrist Extension: AROM, Left, 10 reps Digit Composite Flexion: AROM, Left, 10 reps Composite Extension: AROM, Left, 10 reps Neck Flexion: AROM, 10 reps Neck Extension: AROM, 10 reps Neck Lateral Flexion - Right: AROM, 10 reps Neck Lateral Flexion - Left: AROM, 10 reps   Shoulder Instructions Shoulder Instructions Donning/doffing shirt without moving shoulder: Moderate assistance;Patient able to independently direct caregiver Method for sponge bathing under operated UE: Supervision/safety;Patient able to independently direct  caregiver Donning/doffing sling/immobilizer: Moderate assistance;Patient able to independently direct caregiver Correct positioning of sling/immobilizer: Patient able to independently direct caregiver Pendulum exercises (written home exercise program): Supervision/safety ROM for elbow, wrist and digits of operated UE: Modified independent Sling wearing schedule (on at all times/off for ADL's): Modified independent Proper positioning of operated UE when showering: Modified independent Positioning of UE while sleeping: Modified independent    Home Living Family/patient expects to be discharged to:: Private residence Living Arrangements: Alone Available Help at Discharge: Friend(s);Available PRN/intermittently;Family Type of Home: House Home Access: Stairs to enter [employer] of Steps: 6 Entrance Stairs-Rails: Right Home Layout: Two level;Able to live on main level with bedroom/bathroom     Bathroom Shower/Tub: Arts development officer Toilet: Handicapped height     Home Equipment: Agricultural consultant (2 wheels);Cane - single point;Grab bars - tub/shower   Additional Comments: Per pt she has beens taying with her daughter with 4-5 STE, rail on R, walk-in shower upstairs with built in seat, able to sleep on main level. Daughter works from home so able to provide 24/7 when pt is at  daughter's house      Prior Functioning/Environment Prior Level of Function : Independent/Modified Independent             Mobility Comments: Mod I, recent use of SPC ADLs Comments: mod I        OT Problem List: Decreased strength;Decreased activity tolerance;Decreased range of motion;Impaired balance (sitting and/or standing);Decreased cognition;Decreased safety awareness;Decreased knowledge of use of DME or AE;Decreased knowledge of precautions;Pain      OT Treatment/Interventions: Self-care/ADL training;Therapeutic exercise;DME and/or AE instruction;Balance training;Patient/family  education;Therapeutic activities    OT Goals(Current goals can be found in the care  plan section) Acute Rehab OT Goals Patient Stated Goal: get better OT Goal Formulation: With patient Time For Goal Achievement: 11/29/22 Potential to Achieve Goals: Good  OT Frequency: Min 2X/week    Co-evaluation              AM-PAC OT "6 Clicks" Daily Activity     Outcome Measure Help from another person eating meals?: A Little Help from another person taking care of personal grooming?: A Little Help from another person toileting, which includes using toliet, bedpan, or urinal?: A Little Help from another person bathing (including washing, rinsing, drying)?: A Little Help from another person to put on and taking off regular upper body clothing?: A Lot Help from another person to put on and taking off regular lower body clothing?: A Lot 6 Click Score: 16   End of Session Equipment Utilized During Treatment: Other (comment) (sling) Nurse Communication: Mobility status  Activity Tolerance: Patient tolerated treatment well Patient left: in chair;with call bell/phone within reach  OT Visit Diagnosis: Unsteadiness on feet (R26.81);Muscle weakness (generalized) (M62.81);Pain Pain - Right/Left: Left Pain - part of body: Shoulder                Time: 0821-0911 OT Time Calculation (min): 50 min Charges:  OT General Charges $OT Visit: 1 Visit OT Evaluation $OT Eval Low Complexity: 1 Low OT Treatments $Self Care/Home Management : 23-37 mins  Tyler DeisAileen D Walton, OTD, OTR/L [facility] Office: [phone]   Myrla Halstedileen D Walton 11/15/2022, 10:30 AM

## 2022-11-15 NOTE — Discharge Instructions (Signed)
Orthopaedic Trauma Service Discharge Instructions   General Discharge Instructions  Orthopaedic Injuries:  Left proximal humerus fracture treated with open reduction internal fixation using plate and screws  WEIGHT BEARING STATUS: Nonweightbearing left upper extremity.  No lifting, pushing or pulling with left arm  RANGE OF MOTION/ACTIVITY: Sling on at all times except for when working on motion.  Shoulder pendulums okay.  Passive and active shoulder flexion extension.  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  Passive abduction okay.  Unrestricted range of motion of elbow, forearm wrist and hand.  Slowly increase activity level.  Bone health: Labs show vitamin D deficiency.  Please take vitamin D supplements that have been prescribed for you.  Review the following resource for additional information regarding bone health  [email or web address].cy  Wound Care: Daily wound care starting on 11/17/2022.  You can use silicone foam dressings for wound care.  This is what you have on.  Alternatively you can use 4 x 4 gauze and tape  Discharge Wound Care Instructions  Do NOT apply any ointments, solutions or lotions to pin sites or surgical wounds.  These prevent needed drainage and even though solutions like hydrogen peroxide kill bacteria, they also damage cells lining the pin sites that help fight infection.  Applying lotions or ointments can keep the wounds moist and can cause them to breakdown and open up as well. This can increase the risk for infection. When in doubt call the office.  Surgical incisions should be dressed daily.  If any drainage is noted, use one layer of adaptic or Mepitel, then gauze and tape.  Alternatively you can use Mepilex which is what you have currently on also known as a silicone foam  dressing  NetCamper[employer]cz [email or web address]-soto.com/?pd_[email or web address]&[email or web address]  [email or web address].[email or web address]  These dressing supplies should be available at local medical supply stores ([facility] [employer], [facility], etc). They are not usually carried at places like [facility], [facility], [facility], etc  Once the incision is completely dry and without drainage, it may be left open to air out.  Showering may begin 36-48 hours later.  Cleaning gently with soap and water.   Diet: as you were eating previously.  Can use over the counter stool softeners and bowel preparations, such as Miralax, to help with bowel movements.  Narcotics can be constipating.  Be sure to drink plenty of fluids  PAIN MEDICATION USE AND EXPECTATIONS  You have likely been given narcotic medications to help control your pain.  After a traumatic event that results in an fracture (broken bone) with or without surgery, it is ok to use narcotic pain medications to help control one's pain.  We understand that everyone responds to pain differently and each individual patient will be evaluated on a regular basis for the continued need for narcotic medications. Ideally, narcotic medication use should last no more than 6-8 weeks (coinciding with fracture healing).   As a patient it is your responsibility as well to monitor narcotic medication use and report the amount and frequency you use these medications when you come to your office visit.   We would also advise that if you are using narcotic medications, you should take a dose prior to therapy to maximize you participation.  IF YOU ARE ON NARCOTIC MEDICATIONS IT IS NOT PERMISSIBLE TO OPERATE A MOTOR VEHICLE (MOTORCYCLE/CAR/TRUCK/MOPED) OR HEAVY MACHINERY DO NOT MIX NARCOTICS WITH OTHER CNS (CENTRAL NERVOUS SYSTEM)  DEPRESSANTS SUCH AS ALCOHOL   POST-OPERATIVE OPIOID TAPER INSTRUCTIONS: It is important to wean off of your opioid medication as  soon as possible. If you do not need pain medication after your surgery it is ok to stop day one. Opioids include: Codeine, Hydrocodone(Norco, Vicodin), Oxycodone(Percocet, oxycontin) and hydromorphone amongst others.  Long term and even short term use of opiods can cause: Increased pain response Dependence Constipation Depression Respiratory depression And more.  Withdrawal symptoms can include Flu like symptoms Nausea, vomiting And more Techniques to manage these symptoms Hydrate well Eat regular healthy meals Stay active Use relaxation techniques(deep breathing, meditating, yoga) Do Not substitute Alcohol to help with tapering If you have been on opioids for less than two weeks and do not have pain than it is ok to stop all together.  Plan to wean off of opioids This plan should start within one week post op of your fracture surgery  Maintain the same interval or time between taking each dose and first decrease the dose.  Cut the total daily intake of opioids by one tablet each day Next start to increase the time between doses. The last dose that should be eliminated is the evening dose.    STOP SMOKING OR USING NICOTINE PRODUCTS!!!!  As discussed nicotine severely impairs your body's ability to heal surgical and traumatic wounds but also impairs bone healing.  Wounds and bone heal by forming microscopic blood vessels (angiogenesis) and nicotine is a vasoconstrictor (essentially, shrinks blood vessels).  Therefore, if vasoconstriction occurs to these microscopic blood vessels they essentially disappear and are unable to deliver necessary nutrients to the healing tissue.  This is one modifiable factor that you can do to dramatically increase your chances of healing your injury.    (This means no smoking, no nicotine gum, patches, etc)  DO NOT USE  NONSTEROIDAL ANTI-INFLAMMATORY DRUGS (NSAID'S)  Using products such as Advil (ibuprofen), Aleve (naproxen), Motrin (ibuprofen) for additional pain control during fracture healing can delay and/or prevent the healing response.  If you would like to take over the counter (OTC) medication, Tylenol (acetaminophen) is ok.  However, some narcotic medications that are given for pain control contain acetaminophen as well. Therefore, you should not exceed more than 4000 mg of tylenol in a day if you do not have liver disease.  Also note that there are may OTC medicines, such as cold medicines and allergy medicines that my contain tylenol as well.  If you have any questions about medications and/or interactions please ask your doctor/PA or your pharmacist.      ICE AND ELEVATE INJURED/OPERATIVE EXTREMITY  Using ice and elevating the injured extremity above your heart can help with swelling and pain control.  Icing in a pulsatile fashion, such as 20 minutes on and 20 minutes off, can be followed.    Do not place ice directly on skin. Make sure there is a barrier between to skin and the ice pack.    Using frozen items such as frozen peas works well as the conform nicely to the are that needs to be iced.  USE AN ACE WRAP OR TED HOSE FOR SWELLING CONTROL  In addition to icing and elevation, Ace wraps or TED hose are used to help limit and resolve swelling.  It is recommended to use Ace wraps or TED hose until you are informed to stop.    When using Ace Wraps start the wrapping distally (farthest away from the body) and wrap proximally (closer to the body)   Example: If you had surgery on your leg or thing and you do not have a splint on, start the ace  wrap at the toes and work your way up to the thigh        If you had surgery on your upper extremity and do not have a splint on, start the ace wrap at your fingers and work your way up to the upper arm  IF YOU ARE IN A SPLINT OR CAST DO NOT REMOVE IT FOR ANY  REASON   If your splint gets wet for any reason please contact the office immediately. You may shower in your splint or cast as long as you keep it dry.  This can be done by wrapping in a cast cover or garbage back (or similar)  Do Not stick any thing down your splint or cast such as pencils, money, or hangers to try and scratch yourself with.  If you feel itchy take benadryl as prescribed on the bottle for itching  IF YOU ARE IN A CAM BOOT (BLACK BOOT)  You may remove boot periodically. Perform daily dressing changes as noted below.  Wash the liner of the boot regularly and wear a sock when wearing the boot. It is recommended that you sleep in the boot until told otherwise    Call office for the following: Temperature greater than 101F Persistent nausea and vomiting Severe uncontrolled pain Redness, tenderness, or signs of infection (pain, swelling, redness, odor or green/yellow discharge around the site) Difficulty breathing, headache or visual disturbances Hives Persistent dizziness or light-headedness Extreme fatigue Any other questions or concerns you may have after discharge  In an emergency, call 911 or go to an Emergency Department at a nearby hospital  HELPFUL INFORMATION  If you had a block, it will wear off between 8-24 hrs postop typically.  This is period when your pain may go from nearly zero to the pain you would have had postop without the block.  This is an abrupt transition but nothing dangerous is happening.  You may take an extra dose of narcotic when this happens.  You should wean off your narcotic medicines as soon as you are able.  Most patients will be off or using minimal narcotics before their first postop appointment.   We suggest you use the pain medication the first night prior to going to bed, in order to ease any pain when the anesthesia wears off. You should avoid taking pain medications on an empty stomach as it will make you nauseous.  Do not drink  alcoholic beverages or take illicit drugs when taking pain medications.  In most states it is against the law to drive while you are in a splint or sling.  And certainly against the law to drive while taking narcotics.  You may return to work/school in the next couple of days when you feel up to it.   Pain medication may make you constipated.  Below are a few solutions to try in this order: Decrease the amount of pain medication if you aren't having pain. Drink lots of decaffeinated fluids. Drink prune juice and/or each dried prunes  If the first 3 don't work start with additional solutions Take Colace - an over-the-counter stool softener Take Senokot - an over-the-counter laxative Take Miralax - a stronger over-the-counter laxative     CALL THE OFFICE WITH ANY QUESTIONS OR CONCERNS: [phone]   VISIT OUR WEBSITE FOR ADDITIONAL INFORMATION: [email or web address]

## 2022-11-15 NOTE — Plan of Care (Signed)
  Problem: Education: Goal: Understanding of activity limitations/precautions following surgery will improve Outcome: Progressing   Problem: Activity: Goal: Ability to tolerate increased activity will improve Outcome: Progressing   Problem: Pain Management: Goal: Pain level will decrease with appropriate interventions Outcome: Progressing   Problem: Pain Managment: Goal: General experience of comfort will improve Outcome: Progressing   Problem: Safety: Goal: Ability to remain free from injury will improve Outcome: Progressing   Problem: Skin Integrity: Goal: Risk for impaired skin integrity will decrease Outcome: Progressing

## 2022-11-15 NOTE — Progress Notes (Signed)
Orthopaedic Trauma Service Progress Note  Patient ID: Lydia Elliott MRN: [id number] DOB/AGE: 1951-06-14 72 y.o.  Subjective:  Doing great  Block still working Not having any pain  Wants to go home today   She is already dressed in her street clothes   ROS As above  Objective:   VITALS:   Vitals:   11/14/22 2036 11/14/22 2352 11/15/22 0447 11/15/22 0808  BP: (!) 96/45 (!) 111/47 (!) 127/53 126/60  Pulse: 74 73 74 82  Resp: 16 18 17 18   Temp: 98 F (36.7 C) 97.7 F (36.5 C) 97.9 F (36.6 C) 98 F (36.7 C)  TempSrc: Oral Oral Oral Oral  SpO2: 98% 94% 94% 94%  Weight:      Height:        Estimated body mass index is 27.44 kg/m as calculated from the following:   Height as of this encounter: 5\' 2"  (1.575 m).   Weight as of this encounter: 68 kg.   Intake/Output      04/04 0701 04/05 0700 04/05 0701 04/06 0700   I.V. (mL/kg) 1000.4 (14.7)    IV Piggyback 21.6    Total Intake(mL/kg) 1021.9 (15)    Urine (mL/kg/hr) 0    Blood 150    Total Output 150    Net +871.9         Urine Occurrence 3 x      LABS  Results for orders placed or performed during the hospital encounter of 11/14/22 (from the past 24 hour(s))  CBC     Status: Abnormal   Collection Time: 11/15/22  3:16 AM  Result Value Ref Range   WBC 8.9 4.0 - 10.5 K/uL   RBC 3.97 3.87 - 5.11 MIL/uL   Hemoglobin 8.1 (L) 12.0 - 15.0 g/dL   HCT 22.5 (L) 75.0 - 51.8 %   MCV 70.5 (L) 80.0 - 100.0 fL   MCH 20.4 (L) 26.0 - 34.0 pg   MCHC 28.9 (L) 30.0 - 36.0 g/dL   RDW 33.5 (H) 82.5 - 18.9 %   Platelets 254 150 - 400 K/uL   nRBC 0.3 (H) 0.0 - 0.2 %     PHYSICAL EXAM:   Gen: walking around room. Sling on L arm. NAD, looks good  Lungs: unlabored Cardiac: reg  Ext:       Left Upper Extremity   Sling in place  Dressing clean and intact  Ext warm   Moderate swelling to hand and forearm   Ext warm   + radial pulse  Minimal  digit motion due to block. No sensation noted due to block  Good perfusion distally   Brisk cap refill    Assessment/Plan: 1 Day Post-Op   Principal Problem:   Closed fracture of left proximal humerus Active Problems:   Vitamin D deficiency   Osteoporosis   Anti-infectives (From admission, onward)    Start     Dose/Rate Route Frequency Ordered Stop   11/14/22 1900  ceFAZolin (ANCEF) IVPB 1 g/50 mL premix        1 g 100 mL/hr over 30 Minutes Intravenous Every 6 hours 11/14/22 1712 11/15/22 0702   11/14/22 1448  vancomycin (VANCOCIN) powder  Status:  Discontinued          As needed 11/14/22 1449 11/14/22 1538   11/14/22 0815  ceFAZolin (ANCEF) IVPB 2g/100 mL premix        2 g 200 mL/hr over 30 Minutes Intravenous On call to O.R. 11/14/22 0804 11/14/22 1255     .  POD/HD#: 1  72 y/o RHD female s/p fall with L proximal humerus fracture   -Left proximal humerus fracture s/p ORIF  Weightbearing NWB left upper extremity  No lifting with left arm    ROM/Activity   Sling    Shoulder pendulums. Passive and active shoulder flexion and extension    Passive abduction    No active shoulder abduction    Unrestricted elbow, forearm, wrist and hand motion    Wound care   Daily dressing changes starting on 11/17/2022   Mepilex or similar dressing   - Pain management:  Multimodal    - ABL anemia/Hemodynamics  Stable  Asymptomatic   - Medical issues   Stable  - DVT/PE prophylaxis:  No pharmacologics at dc   - ID:   Periop abx - Metabolic Bone Disease:  + osteoporosis   + vitamin d deficiency    Supplement   Most recent dexa 03/2022  - Activity:  As above  - FEN/GI prophylaxis/Foley/Lines:  Reg diet  - Impediments to fracture healing:  Osteoporosis   Vitamin d deficiency   - Dispo:  Dc home today after therapies  Follow up with ortho in 10-14 days   Mearl Latin, PA-C [phone] (C) 11/15/2022, 11:29 AM  [facility] [address] [place] [address] [phone] [phone] (F)    After 5pm and on the weekends please log on to Amion, go to orthopaedics and the look under the [facility] Call for the provider(s) on call. You can also call our office at [phone] and then follow the prompts to be connected to the call team.  Patient ID: Lydia Elliott, female   DOB: 06-03-1951, 72 y.o.   MRN: [id number]

## 2022-11-15 NOTE — Discharge Summary (Signed)
Orthopaedic Trauma Service (OTS) Discharge Summary   Patient ID: Lydia Elliott MRN: [id number] DOB/AGE: [birth year] 72 y.o.  Admit date: 11/14/2022 Discharge date: 11/15/2022  Admission Diagnoses:***  Discharge Diagnoses:  Principal Problem:   Closed fracture of left proximal humerus Active Problems:   Vitamin D deficiency   Osteoporosis   Past Medical History:  Diagnosis Date   Anemia    Anxiety    a.) on BZO   Aortic atherosclerosis    Arthritis    hands, knees, toes   Bone island 10/15/2016   a.) RIGHT humeral head   CAD (coronary artery disease)    a.) 2010 --> PCI with stent placement x 1 (type location unknown)   Cataract    Depression    GERD (gastroesophageal reflux disease)    Hyperlipidemia    Hypertension    IBS (irritable bowel syndrome)    Insomnia    a.) on zolpidem   Nodule of right lung 10/18/2016   Psoriatic arthritis    Takotsubo cardiomyopathy    Thalassemia    Vertigo    none - 10 yrs   Vitamin D deficiency 11/15/2022   Wears dentures    partial lower     Procedures Performed:  Discharged Condition: {condition:18240}  Hospital Course: ***  Consults: {consultation:18241}  Significant Diagnostic Studies: {diagnostics:18242}  Treatments: {Tx:18249}  Discharge Exam:  ***  Disposition: Discharge disposition: 01-Home or Self Care       Discharge Instructions     Amb Referral to Osteoporosis   Complete by: As directed    Osteoprosis   Call MD / Call 911   Complete by: As directed    If you experience chest pain or shortness of breath, CALL 911 and be transported to the hospital emergency room.  If you develope a fever above 101 F, pus (white drainage) or increased drainage or redness at the wound, or calf pain, call your surgeon's office.   Constipation Prevention   Complete by: As directed    Drink plenty of fluids.  Prune juice may be helpful.  You may use a stool softener, such as Colace (over the counter) 100  mg twice a day.  Use MiraLax (over the counter) for constipation as needed.   Diet - low sodium heart healthy   Complete by: As directed    Discharge instructions   Complete by: As directed    Orthopaedic Trauma Service Discharge Instructions   General Discharge Instructions  Orthopaedic Injuries:  Left proximal humerus fracture treated with open reduction internal fixation using plate and screws  WEIGHT BEARING STATUS: Nonweightbearing left upper extremity.  No lifting, pushing or pulling with left arm  RANGE OF MOTION/ACTIVITY: Sling on at all times except for when working on motion.  Shoulder pendulums okay.  Passive and active shoulder flexion extension.  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  Passive abduction okay.  Unrestricted range of motion of elbow, forearm wrist and hand.  Slowly increase activity level.  Bone health: Labs show vitamin D deficiency.  Please take vitamin D supplements that have been prescribed for you.  Review the following resource for additional information regarding bone health  [email or web address].com.cy  Wound Care: Daily wound care starting on 11/17/2022.  You can use silicone foam dressings for wound care.  This is what you have on.  Alternatively you can use 4 x 4 gauze and tape  Discharge Wound Care Instructions  Do NOT apply any ointments, solutions or lotions to pin sites or surgical wounds.  These prevent needed drainage and even though solutions like hydrogen peroxide kill bacteria, they also damage cells lining the pin sites that help fight infection.  Applying lotions or ointments can keep the wounds moist and can cause them to breakdown and open up as well. This can increase the risk for infection. When in doubt call the office.  Surgical incisions should be dressed daily.  If any drainage is noted, use one layer of adaptic or Mepitel, then gauze and tape.  Alternatively you can use Mepilex which is what you have  currently on also known as a silicone foam dressing  NetCamper.cz [email or web address]://dennis-soto.com/?pd_rd_i=[email or web address]&th=1  [email or web address].com/  These dressing supplies should be available at local medical supply stores ([employer], [employer], etc). They are not usually carried at places like [facility], [facility], [employer], etc  Once the incision is completely dry and without drainage, it may be left open to air out.  Showering may begin 36-48 hours later.  Cleaning gently with soap and water.   Diet: as you were eating previously.  Can use over the counter stool softeners and bowel preparations, such as Miralax, to help with bowel movements.  Narcotics can be constipating.  Be sure to drink plenty of fluids  PAIN MEDICATION USE AND EXPECTATIONS  You have likely been given narcotic medications to help control your pain.  After a traumatic event that results in an fracture (broken bone) with or without surgery, it is ok to use narcotic pain medications to help control one's pain.  We understand that everyone responds to pain differently and each individual patient will be evaluated on a regular basis for the continued need for narcotic medications. Ideally, narcotic medication use should last no more than 6-8 weeks (coinciding with fracture healing).   As a patient it is your responsibility as well to monitor narcotic medication use and report the amount and frequency you use these medications when you come to your office visit.   We would also advise that if you are using narcotic medications, you should take a dose prior to therapy to maximize you participation.  IF YOU ARE ON NARCOTIC MEDICATIONS IT IS NOT PERMISSIBLE TO OPERATE A MOTOR VEHICLE (MOTORCYCLE/CAR/TRUCK/MOPED) OR HEAVY MACHINERY DO NOT MIX  NARCOTICS WITH OTHER CNS (CENTRAL NERVOUS SYSTEM) DEPRESSANTS SUCH AS ALCOHOL   POST-OPERATIVE OPIOID TAPER INSTRUCTIONS:  It is important to wean off of your opioid medication as soon as possible. If you do not need pain medication after your surgery it is ok to stop day one.  Opioids include:  o Codeine, Hydrocodone(Norco, Vicodin), Oxycodone(Percocet, oxycontin) and hydromorphone amongst others.   Long term and even short term use of opiods can cause:  o Increased pain response  o Dependence  o Constipation  o Depression  o Respiratory depression  o And more.   Withdrawal symptoms can include  o Flu like symptoms  o Nausea, vomiting  o And more  Techniques to manage these symptoms  o Hydrate well  o Eat regular healthy meals  o Stay active  o Use relaxation techniques(deep breathing, meditating, yoga)  Do Not substitute Alcohol to help with tapering  If you have been on opioids for less than two weeks and do not have pain than it is ok to stop all together.   Plan to wean off of opioids  o This plan should start within one week post op of your fracture surgery   o Maintain the same interval or time between taking each dose and first decrease  the dose.   o Cut the total daily intake of opioids by one tablet each day  o Next start to increase the time between doses.  o The last dose that should be eliminated is the evening dose.    STOP SMOKING OR USING NICOTINE PRODUCTS!!!!  As discussed nicotine severely impairs your body's ability to heal surgical and traumatic wounds but also impairs bone healing.  Wounds and bone heal by forming microscopic blood vessels (angiogenesis) and nicotine is a vasoconstrictor (essentially, shrinks blood vessels).  Therefore, if vasoconstriction occurs to these microscopic blood vessels they essentially disappear and are unable to deliver necessary nutrients to the healing tissue.  This is one modifiable factor that you can do to dramatically  increase your chances of healing your injury.    (This means no smoking, no nicotine gum, patches, etc)  DO NOT USE NONSTEROIDAL ANTI-INFLAMMATORY DRUGS (NSAID'S)  Using products such as Advil (ibuprofen), Aleve (naproxen), Motrin (ibuprofen) for additional pain control during fracture healing can delay and/or prevent the healing response.  If you would like to take over the counter (OTC) medication, Tylenol (acetaminophen) is ok.  However, some narcotic medications that are given for pain control contain acetaminophen as well. Therefore, you should not exceed more than 4000 mg of tylenol in a day if you do not have liver disease.  Also note that there are may OTC medicines, such as cold medicines and allergy medicines that my contain tylenol as well.  If you have any questions about medications and/or interactions please ask your doctor/PA or your pharmacist.      ICE AND ELEVATE INJURED/OPERATIVE EXTREMITY  Using ice and elevating the injured extremity above your heart can help with swelling and pain control.  Icing in a pulsatile fashion, such as 20 minutes on and 20 minutes off, can be followed.    Do not place ice directly on skin. Make sure there is a barrier between to skin and the ice pack.    Using frozen items such as frozen peas works well as the conform nicely to the are that needs to be iced.  USE AN ACE WRAP OR TED HOSE FOR SWELLING CONTROL  In addition to icing and elevation, Ace wraps or TED hose are used to help limit and resolve swelling.  It is recommended to use Ace wraps or TED hose until you are informed to stop.    When using Ace Wraps start the wrapping distally (farthest away from the body) and wrap proximally (closer to the body)   Example: If you had surgery on your leg or thing and you do not have a splint on, start the ace wrap at the toes and work your way up to the thigh        If you had surgery on your upper extremity and do not have a splint on, start the ace wrap at  your fingers and work your way up to the upper arm  IF YOU ARE IN A SPLINT OR CAST DO NOT REMOVE IT FOR ANY REASON   If your splint gets wet for any reason please contact the office immediately. You may shower in your splint or cast as long as you keep it dry.  This can be done by wrapping in a cast cover or garbage back (or similar)  Do Not stick any thing down your splint or cast such as pencils, money, or hangers to try and scratch yourself with.  If you feel itchy take benadryl as  prescribed on the bottle for itching  IF YOU ARE IN A CAM BOOT (BLACK BOOT)  You may remove boot periodically. Perform daily dressing changes as noted below.  Wash the liner of the boot regularly and wear a sock when wearing the boot. It is recommended that you sleep in the boot until told otherwise    Call office for the following: ? Temperature greater than 101F ? Persistent nausea and vomiting ? Severe uncontrolled pain ? Redness, tenderness, or signs of infection (pain, swelling, redness, odor or green/yellow discharge around the site) ? Difficulty breathing, headache or visual disturbances ? Hives ? Persistent dizziness or light-headedness ? Extreme fatigue ? Any other questions or concerns you may have after discharge  In an emergency, call 911 or go to an Emergency Department at a nearby hospital  HELPFUL INFORMATION  ? If you had a block, it will wear off between 8-24 hrs postop typically.  This is period when your pain may go from nearly zero to the pain you would have had postop without the block.  This is an abrupt transition but nothing dangerous is happening.  You may take an extra dose of narcotic when this happens.  ? You should wean off your narcotic medicines as soon as you are able.  Most patients will be off or using minimal narcotics before their first postop appointment.   ? We suggest you use the pain medication the first night prior to going to bed, in order to ease any pain when the  anesthesia wears off. You should avoid taking pain medications on an empty stomach as it will make you nauseous.  ? Do not drink alcoholic beverages or take illicit drugs when taking pain medications.  ? In most states it is against the law to drive while you are in a splint or sling.  And certainly against the law to drive while taking narcotics.  ? You may return to work/school in the next couple of days when you feel up to it.   ? Pain medication may make you constipated.  Below are a few solutions to try in this order:   ? Decrease the amount of pain medication if you aren't having pain.   ? Drink lots of decaffeinated fluids.   ? Drink prune juice and/or each dried prunes   o If the first 3 don't work start with additional solutions   ? Take Colace - an over-the-counter stool softener   ? Take Senokot - an over-the-counter laxative   ? Take Miralax - a stronger over-the-counter laxative     CALL THE OFFICE WITH ANY QUESTIONS OR CONCERNS: [phone]   VISIT OUR WEBSITE FOR ADDITIONAL INFORMATION: [email or web address]   Driving restrictions   Complete by: As directed    No driving till further notice   Increase activity slowly as tolerated   Complete by: As directed    Lifting restrictions   Complete by: As directed    No lifting until further notice   Non weight bearing   Complete by: As directed    Laterality: left   Extremity: Upper   Post-operative opioid taper instructions:   Complete by: As directed    POST-OPERATIVE OPIOID TAPER INSTRUCTIONS: It is important to wean off of your opioid medication as soon as possible. If you do not need pain medication after your surgery it is ok to stop day one. Opioids include: Codeine, Hydrocodone(Norco, Vicodin), Oxycodone(Percocet, oxycontin) and hydromorphone amongst others.  Long term and even short term  use of opiods can cause: Increased pain response Dependence Constipation Depression Respiratory depression And more.   Withdrawal symptoms can include Flu like symptoms Nausea, vomiting And more Techniques to manage these symptoms Hydrate well Eat regular healthy meals Stay active Use relaxation techniques(deep breathing, meditating, yoga) Do Not substitute Alcohol to help with tapering If you have been on opioids for less than two weeks and do not have pain than it is ok to stop all together.  Plan to wean off of opioids This plan should start within one week post op of your joint replacement. Maintain the same interval or time between taking each dose and first decrease the dose.  Cut the total daily intake of opioids by one tablet each day Next start to increase the time between doses. The last dose that should be eliminated is the evening dose.         Allergies as of 11/15/2022       Reactions   Rosuvastatin    Muscle Pain        Medication List     STOP taking these medications    Adalimumab 40 MG/0.8ML Pskt   amoxicillin 500 MG tablet Commonly known as: AMOXIL   traMADol 50 MG tablet Commonly known as: Ultram       TAKE these medications    acetaminophen 500 MG tablet Commonly known as: TYLENOL Take 1 tablet (500 mg total) by mouth every 12 (twelve) hours.   ALPRAZolam 0.25 MG tablet Commonly known as: XANAX Take 0.25 mg by mouth 2 (two) times daily as needed for anxiety.   aspirin EC 325 MG tablet Take 1 tablet (325 mg total) by mouth daily.   atorvastatin 20 MG tablet Commonly known as: LIPITOR Take 20 mg by mouth daily.   busPIRone 5 MG tablet Commonly known as: BUSPAR Take 5 mg by mouth 2 (two) times daily.   carvedilol 3.125 MG tablet Commonly known as: COREG Take 3.125 mg by mouth 2 (two) times daily with a meal.   Cholecalciferol 125 MCG (5000 UT) Tabs Take by mouth daily.   diclofenac Sodium 1 % Gel Commonly known as: VOLTAREN Apply 1 application topically 2 (two) times daily as needed (pain).   docusate sodium 100 MG capsule Commonly  known as: COLACE Take 1 capsule (100 mg total) by mouth 2 (two) times daily.   escitalopram 20 MG tablet Commonly known as: LEXAPRO TAKE 1 TABLET BY MOUTH EVERY DAY   HYDROcodone-acetaminophen 5-325 MG tablet Commonly known as: Norco Take 1 tablet by mouth every 4 (four) hours as needed for moderate pain. What changed: Another medication with the same name was changed. Make sure you understand how and when to take each.   HYDROcodone-acetaminophen 5-325 MG tablet Commonly known as: Norco Take 1-2 tablets by mouth every 8 (eight) hours as needed for moderate pain or severe pain. What changed:  when to take this reasons to take this   losartan 25 MG tablet Commonly known as: COZAAR Take 25 mg by mouth 2 (two) times a day.   omeprazole 20 MG capsule Commonly known as: PRILOSEC Take 20 mg by mouth daily.   polyethylene glycol 17 g packet Commonly known as: MIRALAX / GLYCOLAX Take 17 g by mouth daily. Mix one tablespoon with 8oz of your favorite juice or water every day until you are having soft formed stools. Then start taking once daily if you didn't have a stool the day before.   vitamin C 1000 MG tablet Take 1  tablet (1,000 mg total) by mouth daily.   zolpidem 10 MG tablet Commonly known as: AMBIEN Take 5 mg by mouth at bedtime as needed for sleep.               Discharge Care Instructions  (From admission, onward)           Start     Ordered   11/15/22 0000  Non weight bearing       Question Answer Comment  Laterality left   Extremity Upper      11/15/22 1143            Follow-up Information     Myrene Galas, MD. Schedule an appointment as soon as possible for a visit in 2 week(s).   Specialty: Orthopedic Surgery Contact information: [address] [place] [address] [phone]                 Discharge Instructions and Plan: ***  Weightbearing: {weightbearing/status:21254} {affected extremity :21255} Insicional and  dressing care: {Insicional and dressing care:21256} Orthopedic device(s): {CHL orthopedic devices:21267::"None"} Showering: *** VTE prophylaxis: {Type:21257} {Duration:21258} Pain control: *** Bone Health/Optimization: *** Follow - up plan: {Follow-up plan:21259} Contact information:  *** MD, *** PA   {BJYN:8295621}  Signed:  Mearl Latin, PA-C [phone] (C) 11/15/2022, 11:44 AM  [facility] [address] [place] [address] [phone] [place]) [phone] (F)

## 2022-11-20 ENCOUNTER — Encounter (HOSPITAL_COMMUNITY): Payer: Self-pay | Admitting: Orthopedic Surgery

## 2022-11-27 DIAGNOSIS — S42242D 4-part fracture of surgical neck of left humerus, subsequent encounter for fracture with routine healing: Secondary | ICD-10-CM | POA: Diagnosis not present

## 2022-11-27 DIAGNOSIS — S42302D Unspecified fracture of shaft of humerus, left arm, subsequent encounter for fracture with routine healing: Secondary | ICD-10-CM | POA: Diagnosis not present

## 2022-12-04 DIAGNOSIS — M79602 Pain in left arm: Secondary | ICD-10-CM | POA: Diagnosis not present

## 2022-12-04 DIAGNOSIS — R293 Abnormal posture: Secondary | ICD-10-CM | POA: Diagnosis not present

## 2022-12-04 DIAGNOSIS — S42202A Unspecified fracture of upper end of left humerus, initial encounter for closed fracture: Secondary | ICD-10-CM | POA: Diagnosis not present

## 2022-12-04 DIAGNOSIS — M25512 Pain in left shoulder: Secondary | ICD-10-CM | POA: Diagnosis not present

## 2022-12-05 ENCOUNTER — Encounter: Payer: Self-pay | Admitting: Family Medicine

## 2022-12-05 ENCOUNTER — Ambulatory Visit: Payer: PPO | Admitting: Family Medicine

## 2022-12-05 VITALS — BP 120/60 | Ht 62.0 in | Wt 150.0 lb

## 2022-12-05 DIAGNOSIS — E559 Vitamin D deficiency, unspecified: Secondary | ICD-10-CM

## 2022-12-05 DIAGNOSIS — D6489 Other specified anemias: Secondary | ICD-10-CM

## 2022-12-05 DIAGNOSIS — M8000XA Age-related osteoporosis with current pathological fracture, unspecified site, initial encounter for fracture: Secondary | ICD-10-CM | POA: Diagnosis not present

## 2022-12-05 NOTE — Assessment & Plan Note (Addendum)
Acute on chronic in nature.  Her previous T-score was demonstrating a T-score of -2.8.  Has a family history of osteoporosis with taking an extended course of prednisone for her psoriatic arthritis. -Counseled on home exercise therapy and supportive care. -SPEP, IFE, phosphorus, magnesium, vitamin D, TSH, PTH and calcium -pursue Evenity given her recent low trauma fracture in the setting of osteoporosis.

## 2022-12-05 NOTE — Patient Instructions (Signed)
Nice to meet you Please try to get me the bone density report  We'll call with the results from today   Please send me a message in MyChart with any questions or updates.  We can discuss which medicine if best based on the results.   --Dr. Jordan Likes

## 2022-12-05 NOTE — Progress Notes (Addendum)
Lydia Elliott - 72 y.o. female MRN [id number]  Date of birth: 24-Dec-1950  SUBJECTIVE:  Including CC & ROS.  No chief complaint on file.   Lydia Elliott is a 72 y.o. female that is presenting with a left proximal humeral fracture and concern for osteoporosis.  Her last bone density was revealing for a T-score of negative -2.8 at the left femoral neck.  She has a significant family history of osteoporosis.  She has been on prednisone previously for her psoriatic arthritis.  She was previously on Humira.  Denies any other fracture. No prior treatment for osteoporosis.  She had a low trauma fall recently on 4/1.  She was admitted and had a surgery for her left proximal humerus fracture.    Review of Systems See HPI   HISTORY: Past Medical, Surgical, Social, and Family History Reviewed & Updated per EMR.   Pertinent Historical Findings include:  Past Medical History:  Diagnosis Date   Anemia    Anxiety    a.) on BZO   Aortic atherosclerosis (HCC)    Arthritis    hands, knees, toes   Bone island 10/15/2016   a.) RIGHT humeral head   CAD (coronary artery disease)    a.) 2010 --> PCI with stent placement x 1 (type location unknown)   Cataract    Depression    GERD (gastroesophageal reflux disease)    Hyperlipidemia    Hypertension    IBS (irritable bowel syndrome)    Insomnia    a.) on zolpidem   Nodule of right lung 10/18/2016   Psoriatic arthritis (HCC)    Takotsubo cardiomyopathy    Thalassemia    Vertigo    none - 10 yrs   Vitamin D deficiency 11/15/2022   Wears dentures    partial lower    Past Surgical History:  Procedure Laterality Date   APPENDECTOMY  08/12/1965   CARPAL TUNNEL RELEASE  08/12/2005   CATARACT EXTRACTION  08/13/2011   CHOLECYSTECTOMY  08/12/1998   COLONOSCOPY  03/11/2014   Dr.Wohl   COLONOSCOPY WITH PROPOFOL N/A 12/11/2015   Procedure: COLONOSCOPY WITH PROPOFOL;  Surgeon: Midge Minium, MD;  Location: [facility];  Service: Endoscopy;   Laterality: N/A;  DESCENDING COLON POLYP    CORONARY ANGIOPLASTY WITH STENT PLACEMENT  08/12/2008   ESOPHAGOGASTRODUODENOSCOPY  02/21/2014   Dr.Wohl   ESOPHAGOGASTRODUODENOSCOPY (EGD) WITH PROPOFOL N/A 12/11/2015   Procedure: ESOPHAGOGASTRODUODENOSCOPY (EGD) WITH PROPOFOL;  Surgeon: Midge Minium, MD;  Location: [facility];  Service: Endoscopy;  Laterality: N/A;   INCISION AND DRAINAGE Right 12/17/2021   Procedure: INCISION AND DRAINAGE;  Surgeon: Juanell Fairly, MD;  Location: [facility];  Service: Orthopedics;  Laterality: Right;   KNEE ARTHROSCOPY Right 10/04/2021   Procedure: ARTHROSCOPY KNEE;  Surgeon: Juanell Fairly, MD;  Location: [facility];  Service: Orthopedics;  Laterality: Right;   KNEE ARTHROSCOPY Right 10/30/2021   Procedure: ARTHROSCOPY KNEE, I&D OF SUPERFICIAL ABSCESS;  Surgeon: Juanell Fairly, MD;  Location: [facility];  Service: Orthopedics;  Laterality: Right;   ORIF HUMERUS FRACTURE Left 11/14/2022   Procedure: OPEN REDUCTION INTERNAL FIXATION (ORIF) PROXIMAL HUMERUS FRACTURE;  Surgeon: Myrene Galas, MD;  Location: [facility];  Service: Orthopedics;  Laterality: Left;   POLYPECTOMY N/A 12/11/2015   Procedure: POLYPECTOMY;  Surgeon: Midge Minium, MD;  Location: [facility];  Service: Endoscopy;  Laterality: N/A;   TONSILLECTOMY     TONSILLECTOMY AND ADENOIDECTOMY  08/13/1967     PHYSICAL EXAM:  VS: BP 120/60 (BP Location: Right Arm,  Patient Position: Sitting)   Ht 5\' 2"  (1.575 m)   Wt 150 lb (68 kg)   BMI 27.44 kg/m  Physical Exam Gen: NAD, alert, cooperative with exam, well-appearing MSK:  Neurovascularly intact       ASSESSMENT & PLAN:   Osteoporosis Acute on chronic in nature.  Her previous T-score was demonstrating a T-score of -2.8.  Has a family history of osteoporosis with taking an extended course of prednisone for her psoriatic arthritis. -Counseled on home exercise therapy and supportive care. -SPEP, IFE, phosphorus, magnesium, vitamin D,  TSH, PTH and calcium -pursue Evenity given her recent low trauma fracture in the setting of osteoporosis.  Anemia Check iron and ferritin.  She does have a history of thalassemia.  Vitamin D deficiency Check vitamin D

## 2022-12-05 NOTE — Assessment & Plan Note (Signed)
Check iron and ferritin.  She does have a history of thalassemia.

## 2022-12-05 NOTE — Assessment & Plan Note (Signed)
Check vitamin D. 

## 2022-12-06 DIAGNOSIS — R293 Abnormal posture: Secondary | ICD-10-CM | POA: Diagnosis not present

## 2022-12-06 DIAGNOSIS — S42202A Unspecified fracture of upper end of left humerus, initial encounter for closed fracture: Secondary | ICD-10-CM | POA: Diagnosis not present

## 2022-12-06 DIAGNOSIS — M25512 Pain in left shoulder: Secondary | ICD-10-CM | POA: Diagnosis not present

## 2022-12-06 DIAGNOSIS — M79602 Pain in left arm: Secondary | ICD-10-CM | POA: Diagnosis not present

## 2022-12-06 LAB — IMMUNOFIXATION ELECTROPHORESIS
IgA/Immunoglobulin A, Serum: 130 mg/dL (ref 64–422)
IgG (Immunoglobin G), Serum: 1089 mg/dL (ref 586–1602)
IgM (Immunoglobulin M), Srm: 111 mg/dL (ref 26–217)

## 2022-12-06 LAB — TSH: TSH: 1.77 u[IU]/mL (ref 0.450–4.500)

## 2022-12-06 LAB — IRON,TIBC AND FERRITIN PANEL
Iron: 96 ug/dL (ref 27–139)
Total Iron Binding Capacity: 308 ug/dL (ref 250–450)

## 2022-12-09 DIAGNOSIS — R293 Abnormal posture: Secondary | ICD-10-CM | POA: Diagnosis not present

## 2022-12-09 DIAGNOSIS — M25512 Pain in left shoulder: Secondary | ICD-10-CM | POA: Diagnosis not present

## 2022-12-09 DIAGNOSIS — M79602 Pain in left arm: Secondary | ICD-10-CM | POA: Diagnosis not present

## 2022-12-09 DIAGNOSIS — S42202A Unspecified fracture of upper end of left humerus, initial encounter for closed fracture: Secondary | ICD-10-CM | POA: Diagnosis not present

## 2022-12-09 LAB — IMMUNOFIXATION ELECTROPHORESIS: Total Protein: 6.9 g/dL (ref 6.0–8.5)

## 2022-12-09 LAB — MAGNESIUM: Magnesium: 2 mg/dL (ref 1.6–2.3)

## 2022-12-09 LAB — PROTEIN ELECTROPHORESIS, SERUM
A/G Ratio: 1.3 (ref 0.7–1.7)
Alpha 2: 0.7 g/dL (ref 0.4–1.0)
Gamma Globulin: 1.1 g/dL (ref 0.4–1.8)

## 2022-12-09 LAB — PTH, INTACT AND CALCIUM: Calcium: 9.9 mg/dL (ref 8.7–10.3)

## 2022-12-09 LAB — IRON,TIBC AND FERRITIN PANEL
Ferritin: 376 ng/mL — ABNORMAL HIGH (ref 15–150)
Iron Saturation: 31 % (ref 15–55)

## 2022-12-11 DIAGNOSIS — R293 Abnormal posture: Secondary | ICD-10-CM | POA: Diagnosis not present

## 2022-12-11 DIAGNOSIS — M25512 Pain in left shoulder: Secondary | ICD-10-CM | POA: Diagnosis not present

## 2022-12-11 DIAGNOSIS — S42202A Unspecified fracture of upper end of left humerus, initial encounter for closed fracture: Secondary | ICD-10-CM | POA: Diagnosis not present

## 2022-12-11 DIAGNOSIS — M79602 Pain in left arm: Secondary | ICD-10-CM | POA: Diagnosis not present

## 2022-12-13 LAB — IMMUNOFIXATION ELECTROPHORESIS

## 2022-12-13 LAB — IRON,TIBC AND FERRITIN PANEL: UIBC: 212 ug/dL (ref 118–369)

## 2022-12-13 LAB — VITAMIN D 25 HYDROXY (VIT D DEFICIENCY, FRACTURES): Vit D, 25-Hydroxy: 38.5 ng/mL (ref 30.0–100.0)

## 2022-12-13 LAB — PTH, INTACT AND CALCIUM: PTH: 34 pg/mL (ref 15–65)

## 2022-12-13 LAB — PROTEIN ELECTROPHORESIS, SERUM
Albumin ELP: 3.9 g/dL (ref 2.9–4.4)
Alpha 1: 0.3 g/dL (ref 0.0–0.4)
Beta: 0.8 g/dL (ref 0.7–1.3)
Globulin, Total: 3 g/dL (ref 2.2–3.9)

## 2022-12-13 LAB — PHOSPHORUS: Phosphorus: 4 mg/dL (ref 3.0–4.3)

## 2022-12-16 ENCOUNTER — Telehealth: Payer: Self-pay | Admitting: Family Medicine

## 2022-12-16 DIAGNOSIS — S42302D Unspecified fracture of shaft of humerus, left arm, subsequent encounter for fracture with routine healing: Secondary | ICD-10-CM | POA: Diagnosis not present

## 2022-12-16 DIAGNOSIS — S42242D 4-part fracture of surgical neck of left humerus, subsequent encounter for fracture with routine healing: Secondary | ICD-10-CM | POA: Diagnosis not present

## 2022-12-16 NOTE — Telephone Encounter (Signed)
Left VM for patient. If she calls back please have her speak with a nurse/CMA and inform that her labs are normal. We can consider pursing an injectable for her osteoporosis. .   If any questions then please take the best time and phone number to call and I will try to call her back.   Myra Rude, MD [facility] 12/16/2022, 9:09 AM

## 2022-12-17 NOTE — Telephone Encounter (Signed)
Pt informed of below.  

## 2022-12-18 DIAGNOSIS — M25512 Pain in left shoulder: Secondary | ICD-10-CM | POA: Diagnosis not present

## 2022-12-18 DIAGNOSIS — S42202A Unspecified fracture of upper end of left humerus, initial encounter for closed fracture: Secondary | ICD-10-CM | POA: Diagnosis not present

## 2022-12-18 DIAGNOSIS — M79602 Pain in left arm: Secondary | ICD-10-CM | POA: Diagnosis not present

## 2022-12-18 DIAGNOSIS — R293 Abnormal posture: Secondary | ICD-10-CM | POA: Diagnosis not present

## 2022-12-18 NOTE — Telephone Encounter (Signed)
Informed of results.   Myra Rude, MD [facility] 12/18/2022, 2:30 PM

## 2022-12-20 DIAGNOSIS — M79602 Pain in left arm: Secondary | ICD-10-CM | POA: Diagnosis not present

## 2022-12-20 DIAGNOSIS — R293 Abnormal posture: Secondary | ICD-10-CM | POA: Diagnosis not present

## 2022-12-20 DIAGNOSIS — M25512 Pain in left shoulder: Secondary | ICD-10-CM | POA: Diagnosis not present

## 2022-12-20 DIAGNOSIS — S42202A Unspecified fracture of upper end of left humerus, initial encounter for closed fracture: Secondary | ICD-10-CM | POA: Diagnosis not present

## 2022-12-23 DIAGNOSIS — M79602 Pain in left arm: Secondary | ICD-10-CM | POA: Diagnosis not present

## 2022-12-23 DIAGNOSIS — M25512 Pain in left shoulder: Secondary | ICD-10-CM | POA: Diagnosis not present

## 2022-12-23 DIAGNOSIS — S42202A Unspecified fracture of upper end of left humerus, initial encounter for closed fracture: Secondary | ICD-10-CM | POA: Diagnosis not present

## 2022-12-23 DIAGNOSIS — R293 Abnormal posture: Secondary | ICD-10-CM | POA: Diagnosis not present

## 2022-12-25 DIAGNOSIS — M79602 Pain in left arm: Secondary | ICD-10-CM | POA: Diagnosis not present

## 2022-12-25 DIAGNOSIS — S42202A Unspecified fracture of upper end of left humerus, initial encounter for closed fracture: Secondary | ICD-10-CM | POA: Diagnosis not present

## 2022-12-25 DIAGNOSIS — M25512 Pain in left shoulder: Secondary | ICD-10-CM | POA: Diagnosis not present

## 2022-12-25 DIAGNOSIS — R293 Abnormal posture: Secondary | ICD-10-CM | POA: Diagnosis not present

## 2022-12-25 NOTE — Telephone Encounter (Signed)
Evenity VOB initiated via [email or web address]  New start Current fracture  

## 2022-12-30 DIAGNOSIS — S42202A Unspecified fracture of upper end of left humerus, initial encounter for closed fracture: Secondary | ICD-10-CM | POA: Diagnosis not present

## 2022-12-30 DIAGNOSIS — M79602 Pain in left arm: Secondary | ICD-10-CM | POA: Diagnosis not present

## 2022-12-30 DIAGNOSIS — M25512 Pain in left shoulder: Secondary | ICD-10-CM | POA: Diagnosis not present

## 2022-12-30 DIAGNOSIS — R293 Abnormal posture: Secondary | ICD-10-CM | POA: Diagnosis not present

## 2022-12-30 NOTE — Telephone Encounter (Signed)
Undisclosed   

## 2023-01-01 DIAGNOSIS — M79602 Pain in left arm: Secondary | ICD-10-CM | POA: Diagnosis not present

## 2023-01-01 DIAGNOSIS — M25512 Pain in left shoulder: Secondary | ICD-10-CM | POA: Diagnosis not present

## 2023-01-01 DIAGNOSIS — R293 Abnormal posture: Secondary | ICD-10-CM | POA: Diagnosis not present

## 2023-01-01 DIAGNOSIS — S42202A Unspecified fracture of upper end of left humerus, initial encounter for closed fracture: Secondary | ICD-10-CM | POA: Diagnosis not present

## 2023-01-07 DIAGNOSIS — S42202A Unspecified fracture of upper end of left humerus, initial encounter for closed fracture: Secondary | ICD-10-CM | POA: Diagnosis not present

## 2023-01-07 DIAGNOSIS — R293 Abnormal posture: Secondary | ICD-10-CM | POA: Diagnosis not present

## 2023-01-07 DIAGNOSIS — M79602 Pain in left arm: Secondary | ICD-10-CM | POA: Diagnosis not present

## 2023-01-07 DIAGNOSIS — M25512 Pain in left shoulder: Secondary | ICD-10-CM | POA: Diagnosis not present

## 2023-01-09 DIAGNOSIS — S42202A Unspecified fracture of upper end of left humerus, initial encounter for closed fracture: Secondary | ICD-10-CM | POA: Diagnosis not present

## 2023-01-09 DIAGNOSIS — M79602 Pain in left arm: Secondary | ICD-10-CM | POA: Diagnosis not present

## 2023-01-09 DIAGNOSIS — M25512 Pain in left shoulder: Secondary | ICD-10-CM | POA: Diagnosis not present

## 2023-01-09 DIAGNOSIS — R293 Abnormal posture: Secondary | ICD-10-CM | POA: Diagnosis not present

## 2023-01-11 NOTE — Telephone Encounter (Signed)
Pt ready for scheduling on or after 01/11/23  Out-of-pocket cost due at time of visit: $475  Primary: Medicare Evenity co-insurance: 20% (approximately $450) Admin fee co-insurance: 20% (approximately $25)  Deductible: does not apply  Prior Auth: NOT required  Secondary: N/A Evenity co-insurance:  Admin fee co-insurance:  Deductible:  Prior Auth:  PA# Valid:   ** This summary of benefits is an estimation of the patient's out-of-pocket cost. Exact cost may vary based on individual plan coverage.

## 2023-01-13 DIAGNOSIS — M79602 Pain in left arm: Secondary | ICD-10-CM | POA: Diagnosis not present

## 2023-01-13 DIAGNOSIS — M25512 Pain in left shoulder: Secondary | ICD-10-CM | POA: Diagnosis not present

## 2023-01-13 DIAGNOSIS — S42202A Unspecified fracture of upper end of left humerus, initial encounter for closed fracture: Secondary | ICD-10-CM | POA: Diagnosis not present

## 2023-01-13 DIAGNOSIS — R293 Abnormal posture: Secondary | ICD-10-CM | POA: Diagnosis not present

## 2023-01-15 DIAGNOSIS — S42302D Unspecified fracture of shaft of humerus, left arm, subsequent encounter for fracture with routine healing: Secondary | ICD-10-CM | POA: Diagnosis not present

## 2023-01-15 DIAGNOSIS — M7552 Bursitis of left shoulder: Secondary | ICD-10-CM | POA: Diagnosis not present

## 2023-01-15 DIAGNOSIS — S42242D 4-part fracture of surgical neck of left humerus, subsequent encounter for fracture with routine healing: Secondary | ICD-10-CM | POA: Diagnosis not present

## 2023-01-16 NOTE — Telephone Encounter (Signed)
Pt informed of below.  She declines Evenity at this time due to cost. She wants to speak with her PCP again first. She is scheduled with him on 01/30/23. She will call us back if she decides to proceed with Evenity after speaking with her PCP.

## 2023-01-16 NOTE — Telephone Encounter (Signed)
Pt states she wants her 01/27/23 visit with Dr. Pearletha Forge cancelled.

## 2023-01-17 DIAGNOSIS — S42202A Unspecified fracture of upper end of left humerus, initial encounter for closed fracture: Secondary | ICD-10-CM | POA: Diagnosis not present

## 2023-01-17 DIAGNOSIS — R293 Abnormal posture: Secondary | ICD-10-CM | POA: Diagnosis not present

## 2023-01-17 DIAGNOSIS — M79602 Pain in left arm: Secondary | ICD-10-CM | POA: Diagnosis not present

## 2023-01-17 DIAGNOSIS — M25512 Pain in left shoulder: Secondary | ICD-10-CM | POA: Diagnosis not present

## 2023-01-20 DIAGNOSIS — S42202A Unspecified fracture of upper end of left humerus, initial encounter for closed fracture: Secondary | ICD-10-CM | POA: Diagnosis not present

## 2023-01-20 DIAGNOSIS — R293 Abnormal posture: Secondary | ICD-10-CM | POA: Diagnosis not present

## 2023-01-20 DIAGNOSIS — M79602 Pain in left arm: Secondary | ICD-10-CM | POA: Diagnosis not present

## 2023-01-20 DIAGNOSIS — M25512 Pain in left shoulder: Secondary | ICD-10-CM | POA: Diagnosis not present

## 2023-01-21 NOTE — Telephone Encounter (Signed)
Pt archived in [email or web address].  Please advise if patient and/or provider wish to proceed with EVENITY therapy.  

## 2023-01-27 ENCOUNTER — Ambulatory Visit: Payer: PPO | Admitting: Family Medicine

## 2023-01-27 DIAGNOSIS — M25512 Pain in left shoulder: Secondary | ICD-10-CM | POA: Diagnosis not present

## 2023-01-27 DIAGNOSIS — S42202A Unspecified fracture of upper end of left humerus, initial encounter for closed fracture: Secondary | ICD-10-CM | POA: Diagnosis not present

## 2023-01-27 DIAGNOSIS — R293 Abnormal posture: Secondary | ICD-10-CM | POA: Diagnosis not present

## 2023-01-27 DIAGNOSIS — M79602 Pain in left arm: Secondary | ICD-10-CM | POA: Diagnosis not present

## 2023-01-30 DIAGNOSIS — F32A Depression, unspecified: Secondary | ICD-10-CM | POA: Diagnosis not present

## 2023-01-30 DIAGNOSIS — R5383 Other fatigue: Secondary | ICD-10-CM | POA: Diagnosis not present

## 2023-01-30 DIAGNOSIS — Z0000 Encounter for general adult medical examination without abnormal findings: Secondary | ICD-10-CM | POA: Diagnosis not present

## 2023-01-30 DIAGNOSIS — I2510 Atherosclerotic heart disease of native coronary artery without angina pectoris: Secondary | ICD-10-CM | POA: Diagnosis not present

## 2023-01-30 DIAGNOSIS — F411 Generalized anxiety disorder: Secondary | ICD-10-CM | POA: Diagnosis not present

## 2023-01-30 DIAGNOSIS — E538 Deficiency of other specified B group vitamins: Secondary | ICD-10-CM | POA: Diagnosis not present

## 2023-01-30 DIAGNOSIS — E782 Mixed hyperlipidemia: Secondary | ICD-10-CM | POA: Diagnosis not present

## 2023-01-30 DIAGNOSIS — I10 Essential (primary) hypertension: Secondary | ICD-10-CM | POA: Diagnosis not present

## 2023-01-30 DIAGNOSIS — D649 Anemia, unspecified: Secondary | ICD-10-CM | POA: Diagnosis not present

## 2023-01-30 DIAGNOSIS — F5104 Psychophysiologic insomnia: Secondary | ICD-10-CM | POA: Diagnosis not present

## 2023-01-30 DIAGNOSIS — L4050 Arthropathic psoriasis, unspecified: Secondary | ICD-10-CM | POA: Diagnosis not present

## 2023-02-03 DIAGNOSIS — S42202A Unspecified fracture of upper end of left humerus, initial encounter for closed fracture: Secondary | ICD-10-CM | POA: Diagnosis not present

## 2023-02-03 DIAGNOSIS — R293 Abnormal posture: Secondary | ICD-10-CM | POA: Diagnosis not present

## 2023-02-03 DIAGNOSIS — M79602 Pain in left arm: Secondary | ICD-10-CM | POA: Diagnosis not present

## 2023-02-03 DIAGNOSIS — M25512 Pain in left shoulder: Secondary | ICD-10-CM | POA: Diagnosis not present

## 2023-02-04 DIAGNOSIS — E538 Deficiency of other specified B group vitamins: Secondary | ICD-10-CM | POA: Diagnosis not present

## 2023-02-05 DIAGNOSIS — S42202A Unspecified fracture of upper end of left humerus, initial encounter for closed fracture: Secondary | ICD-10-CM | POA: Diagnosis not present

## 2023-02-05 DIAGNOSIS — R293 Abnormal posture: Secondary | ICD-10-CM | POA: Diagnosis not present

## 2023-02-05 DIAGNOSIS — M79602 Pain in left arm: Secondary | ICD-10-CM | POA: Diagnosis not present

## 2023-02-05 DIAGNOSIS — M25512 Pain in left shoulder: Secondary | ICD-10-CM | POA: Diagnosis not present

## 2023-02-09 ENCOUNTER — Ambulatory Visit
Admission: EM | Admit: 2023-02-09 | Discharge: 2023-02-09 | Disposition: A | Discharge status: Home / Self Care | Payer: PPO | Attending: Emergency Medicine | Admitting: Emergency Medicine

## 2023-02-09 DIAGNOSIS — J069 Acute upper respiratory infection, unspecified: Secondary | ICD-10-CM | POA: Diagnosis not present

## 2023-02-09 LAB — GROUP A STREP BY PCR: Group A Strep by PCR: NOT DETECTED

## 2023-02-09 MED ORDER — IPRATROPIUM BROMIDE 0.06 % NA SOLN: 2.0000 | Freq: Four times a day (QID) | NASAL | 12 refills | Status: DC

## 2023-02-09 NOTE — Discharge Instructions (Addendum)
Your strep test today was negative.  I do believe you have a viral upper respiratory infection and your postnasal drip is what is irritating her throat.  Use the Atrovent nasal spray, 2 squirts in each nostril every 6 hours, as needed for runny nose and postnasal drip.  Gargle with warm salt water 2-3 times a day to soothe your throat, aid in pain relief, and aid in healing.  Take over-the-counter ibuprofen according to the package instructions as needed for pain.  You can also use Chloraseptic or Sucrets lozenges, 1 lozenge every 2 hours as needed for throat pain.  If you develop any new or worsening symptoms return for reevaluation.

## 2023-02-09 NOTE — ED Triage Notes (Signed)
Pt c/o sore throat & L ear pain x3 days. States she was exposed to strep. Denies any fevers. Has tried gargling salt water w/o relief.

## 2023-02-09 NOTE — ED Provider Notes (Signed)
[facility]    CSN: [id number] Arrival date & time: 02/09/23  0955      History   Chief Complaint Chief Complaint  Patient presents with   Sore Throat   Otalgia    HPI Lydia Elliott is a 72 y.o. female.   HPI  72 year old female with a past medical history significant for hypertension, hyperlipidemia, CAD, thalassemia, Takotsubo cardiomyopathy, and GERD presents for evaluation of sore throat and left ear pain that were present for last 3 days.  She also endorses a mild runny nose for clear fluid and an infrequent nonproductive cough which is more of a throat clearing.  She denies fever.  She was recently exposed to strep as her grandson just tested positive.  Past Medical History:  Diagnosis Date   Anemia    Anxiety    a.) on BZO   Aortic atherosclerosis (HCC)    Arthritis    hands, knees, toes   Bone island 10/15/2016   a.) RIGHT humeral head   CAD (coronary artery disease)    a.) 2010 --> PCI with stent placement x 1 (type location unknown)   Cataract    Depression    GERD (gastroesophageal reflux disease)    Hyperlipidemia    Hypertension    IBS (irritable bowel syndrome)    Insomnia    a.) on zolpidem   Nodule of right lung 10/18/2016   Psoriatic arthritis (HCC)    Takotsubo cardiomyopathy    Thalassemia    Vertigo    none - 10 yrs   Vitamin D deficiency 11/15/2022   Wears dentures    partial lower    Patient Active Problem List   Diagnosis Date Noted   Vitamin D deficiency 11/15/2022   Osteoporosis 11/15/2022   Closed fracture of left proximal humerus 11/14/2022   Septic joint of right knee joint (HCC) 12/16/2021   Depression with anxiety 12/16/2021   Aortic atherosclerosis (HCC) 10/18/2016   Nodule of right lung 10/18/2016   GERD (gastroesophageal reflux disease) 04/24/2016   Anxiety about health 12/23/2015   Overweight (BMI 25.0-29.9) 12/23/2015   Benign neoplasm of descending colon    Impaired fasting glucose 07/03/2015    Anemia 07/03/2015   Takotsubo cardiomyopathy 06/18/2015   Hypertension    Hyperlipidemia    Insomnia    Polyarthralgia    CAD (coronary artery disease)    Thalassemia    Heart disease     Past Surgical History:  Procedure Laterality Date   APPENDECTOMY  08/12/1965   CARPAL TUNNEL RELEASE  08/12/2005   CATARACT EXTRACTION  08/13/2011   CHOLECYSTECTOMY  08/12/1998   COLONOSCOPY  03/11/2014   Dr.Wohl   COLONOSCOPY WITH PROPOFOL N/A 12/11/2015   Procedure: COLONOSCOPY WITH PROPOFOL;  Surgeon: Midge Minium, MD;  Location: [facility];  Service: Endoscopy;  Laterality: N/A;  DESCENDING COLON POLYP    CORONARY ANGIOPLASTY WITH STENT PLACEMENT  08/12/2008   ESOPHAGOGASTRODUODENOSCOPY  02/21/2014   Dr.Wohl   ESOPHAGOGASTRODUODENOSCOPY (EGD) WITH PROPOFOL N/A 12/11/2015   Procedure: ESOPHAGOGASTRODUODENOSCOPY (EGD) WITH PROPOFOL;  Surgeon: Midge Minium, MD;  Location: [facility];  Service: Endoscopy;  Laterality: N/A;   INCISION AND DRAINAGE Right 12/17/2021   Procedure: INCISION AND DRAINAGE;  Surgeon: Juanell Fairly, MD;  Location: [facility];  Service: Orthopedics;  Laterality: Right;   KNEE ARTHROSCOPY Right 10/04/2021   Procedure: ARTHROSCOPY KNEE;  Surgeon: Juanell Fairly, MD;  Location: [facility];  Service: Orthopedics;  Laterality: Right;   KNEE ARTHROSCOPY Right 10/30/2021  Procedure: ARTHROSCOPY KNEE, I&D OF SUPERFICIAL ABSCESS;  Surgeon: Juanell Fairly, MD;  Location: [facility];  Service: Orthopedics;  Laterality: Right;   ORIF HUMERUS FRACTURE Left 11/14/2022   Procedure: OPEN REDUCTION INTERNAL FIXATION (ORIF) PROXIMAL HUMERUS FRACTURE;  Surgeon: Myrene Galas, MD;  Location: [facility];  Service: Orthopedics;  Laterality: Left;   POLYPECTOMY N/A 12/11/2015   Procedure: POLYPECTOMY;  Surgeon: Midge Minium, MD;  Location: [facility];  Service: Endoscopy;  Laterality: N/A;   TONSILLECTOMY     TONSILLECTOMY AND ADENOIDECTOMY  08/13/1967    OB History   No  obstetric history on file.      Home Medications    Prior to Admission medications   Medication Sig Start Date End Date Taking? Authorizing Provider  acetaminophen (TYLENOL) 500 MG tablet Take 1 tablet (500 mg total) by mouth every 12 (twelve) hours. 11/15/22  Yes Montez Morita, PA-C  alendronate (FOSAMAX) 70 MG tablet Take by mouth. 01/30/23 01/30/24 Yes [provider]  ALPRAZolam Prudy Feeler) 0.25 MG tablet Take 0.25 mg by mouth 2 (two) times daily as needed for anxiety.   Yes [provider]  Ascorbic Acid (VITAMIN C) 1000 MG tablet Take 1 tablet (1,000 mg total) by mouth daily. 11/15/22  Yes Montez Morita, PA-C  atorvastatin (LIPITOR) 20 MG tablet Take 20 mg by mouth daily. 09/05/21  Yes [provider]  busPIRone (BUSPAR) 5 MG tablet Take 5 mg by mouth 2 (two) times daily. 01/25/20  Yes [provider]  carvedilol (COREG) 3.125 MG tablet Take 3.125 mg by mouth 2 (two) times daily with a meal. 03/15/16  Yes [provider]  Cholecalciferol 125 MCG (5000 UT) TABS Take 1 tablet (5,000 Units total) by mouth daily. 11/15/22  Yes Montez Morita, PA-C  diclofenac Sodium (VOLTAREN) 1 % GEL Apply 1 application topically 2 (two) times daily as needed (pain).   Yes [provider]  docusate sodium (COLACE) 100 MG capsule Take 1 capsule (100 mg total) by mouth 2 (two) times daily. 11/15/22  Yes Montez Morita, PA-C  escitalopram (LEXAPRO) 20 MG tablet TAKE 1 TABLET BY MOUTH EVERY DAY 11/10/19  Yes Danelle Berry, PA-C  ipratropium (ATROVENT) 0.06 % nasal spray Place 2 sprays into both nostrils 4 (four) times daily. 02/09/23  Yes Becky Augusta, NP  losartan (COZAAR) 25 MG tablet Take 25 mg by mouth 2 (two) times a day.  08/03/18  Yes [provider]  omeprazole (PRILOSEC) 20 MG capsule Take 20 mg by mouth daily.   Yes [provider]  polyethylene glycol (MIRALAX / GLYCOLAX) 17 g packet Take 17 g by mouth daily. Mix one tablespoon with 8oz of your favorite juice  or water every day until you are having soft formed stools. Then start taking once daily if you didn't have a stool the day before. 11/06/22  Yes Willy Eddy, MD  zolpidem (AMBIEN) 10 MG tablet Take 5 mg by mouth at bedtime as needed for sleep. 05/19/15  Yes [provider]    Family History Family History  Problem Relation Age of Onset   Cirrhosis Mother    Hepatitis Mother    Thalassemia Mother    Diabetes Mother    Heart disease Father    Diabetes Father    Hyperlipidemia Father    Hypertension Father    Stroke Father    Cancer Sister        lung   Diabetes Sister    Stroke Sister  during a surgery   Diabetes Brother    Heart disease Brother    Hypertension Brother    Stroke Brother    Thalassemia Maternal Grandmother    COPD Neg Hx     Social History Social History   Tobacco Use   Smoking status: Former    Packs/day: 0.50    Years: 1.00    Additional pack years: 0.00    Total pack years: 0.50    Types: Cigarettes    Quit date: 1972    Years since quitting: 52.5   Smokeless tobacco: Never   Tobacco comments:    smoked occasionally  Vaping Use   Vaping Use: Never used  Substance Use Topics   Alcohol use: Yes    Alcohol/week: 1.0 standard drink of alcohol    Types: 1 Shots of liquor per week    Comment: socially   Drug use: No     Allergies   Rosuvastatin   Review of Systems Review of Systems  Constitutional:  Negative for fever.  HENT:  Positive for ear pain, rhinorrhea and sore throat. Negative for congestion.   Respiratory:  Positive for cough. Negative for shortness of breath and wheezing.      Physical Exam Triage Vital Signs ED Triage Vitals  Enc Vitals Group     BP --      Pulse --      Resp 02/09/23 1003 16     Temp --      Temp Source 02/09/23 1003 Oral     SpO2 --      Weight 02/09/23 1003 145 lb (65.8 kg)     Height 02/09/23 1003 5\' 2"  (1.575 m)     Head Circumference --      Peak Flow --      Pain Score  02/09/23 1005 3     Pain Loc --      Pain Edu? --      Excl. in GC? --    No data found.  Updated Vital Signs BP (!) 163/81 (BP Location: Right Arm)   Pulse 67   Temp 98.9 F (37.2 C) (Oral)   Resp 16   Ht 5\' 2"  (1.575 m)   Wt 145 lb (65.8 kg)   SpO2 96%   BMI 26.52 kg/m   Visual Acuity Right Eye Distance:   Left Eye Distance:   Bilateral Distance:    Right Eye Near:   Left Eye Near:    Bilateral Near:     Physical Exam Vitals and nursing note reviewed.  Constitutional:      Appearance: Normal appearance. She is not ill-appearing.  HENT:     Head: Normocephalic and atraumatic.     Right Ear: Tympanic membrane, ear canal and external ear normal. There is no impacted cerumen.     Left Ear: Tympanic membrane, ear canal and external ear normal. There is no impacted cerumen.     Nose: Congestion and rhinorrhea present.     Comments: Nasal mucosa is mildly edematous without significant erythema.  Scant clear rhinorrhea in both nares.    Mouth/Throat:     Mouth: Mucous membranes are moist.     Pharynx: Oropharynx is clear. Posterior oropharyngeal erythema present. No oropharyngeal exudate.     Comments: Erythema to the soft palate as well as bilateral tonsillar pillars.  No significant edema of the tonsillar pillars, injection, or exudate noted.  Posterior pharynx also demonstrates mild erythema with clear postnasal drip. Cardiovascular:  Rate and Rhythm: Normal rate and regular rhythm.     Pulses: Normal pulses.     Heart sounds: Normal heart sounds. No murmur heard.    No friction rub. No gallop.  Pulmonary:     Effort: Pulmonary effort is normal.     Breath sounds: Normal breath sounds. No wheezing, rhonchi or rales.  Musculoskeletal:     Cervical back: Normal range of motion and neck supple.  Lymphadenopathy:     Cervical: No cervical adenopathy.  Skin:    General: Skin is warm and dry.     Capillary Refill: Capillary refill takes less than 2 seconds.      Findings: No erythema or rash.  Neurological:     General: No focal deficit present.     Mental Status: She is alert and oriented to person, place, and time.      UC Treatments / Results  Labs (all labs ordered are listed, but only abnormal results are displayed) Labs Reviewed  GROUP A STREP BY PCR    EKG   Radiology No results found.  Procedures Procedures (including critical care time)  Medications Ordered in UC Medications - No data to display  Initial Impression / Assessment and Plan / UC Course  I have reviewed the triage vital signs and the nursing notes.  Pertinent labs & imaging results that were available during my care of the patient were reviewed by me and considered in my medical decision making (see chart for details).   Patient is a very pleasant, nontoxic-appearing 40 old female presenting for evaluation of sore throat and left ear pain as outlined in HPI above.  She was recently exposed to strep so I will order a strep PCR as she does have erythema to her soft palate and bilateral tonsillar pillars.  Tonsillar pillars are not significantly edematous or injected.  There is no appreciable exudate.  She does have some clear postnasal drip in the posterior oropharynx with some mild erythema of the tissues as well.  If the patient strep is negative I will treat her for a viral URI with postnasal drip and prescribe some Atrovent nasal spray to help with the nasal congestion, runny nose, and postnasal drip.  Strep PCR is negative.  I will discharge patient home with a diagnosis of viral URI and prescribe Edgemon is graded with postnasal drip which is what I believe is causing her sore throat.  Supportive care to include salt water gargles and over-the-counter Chloraseptic or Sucrets lozenges.  Return precautions reviewed.   Final Clinical Impressions(s) / UC Diagnoses   Final diagnoses:  Viral upper respiratory tract infection     Discharge Instructions       Your strep test today was negative.  I do believe you have a viral upper respiratory infection and your postnasal drip is what is irritating her throat.  Use the Atrovent nasal spray, 2 squirts in each nostril every 6 hours, as needed for runny nose and postnasal drip.  Gargle with warm salt water 2-3 times a day to soothe your throat, aid in pain relief, and aid in healing.  Take over-the-counter ibuprofen according to the package instructions as needed for pain.  You can also use Chloraseptic or Sucrets lozenges, 1 lozenge every 2 hours as needed for throat pain.  If you develop any new or worsening symptoms return for reevaluation.      ED Prescriptions     Medication Sig Dispense Auth. Provider   ipratropium (ATROVENT) 0.06 %  nasal spray Place 2 sprays into both nostrils 4 (four) times daily. 15 mL Becky Augusta, NP      PDMP not reviewed this encounter.   Becky Augusta, NP 02/09/23 [phone]

## 2023-02-11 DIAGNOSIS — Z961 Presence of intraocular lens: Secondary | ICD-10-CM | POA: Diagnosis not present

## 2023-02-11 DIAGNOSIS — R293 Abnormal posture: Secondary | ICD-10-CM | POA: Diagnosis not present

## 2023-02-11 DIAGNOSIS — H26491 Other secondary cataract, right eye: Secondary | ICD-10-CM | POA: Diagnosis not present

## 2023-02-11 DIAGNOSIS — M79602 Pain in left arm: Secondary | ICD-10-CM | POA: Diagnosis not present

## 2023-02-11 DIAGNOSIS — M25512 Pain in left shoulder: Secondary | ICD-10-CM | POA: Diagnosis not present

## 2023-02-11 DIAGNOSIS — S42202A Unspecified fracture of upper end of left humerus, initial encounter for closed fracture: Secondary | ICD-10-CM | POA: Diagnosis not present

## 2023-02-11 DIAGNOSIS — G43109 Migraine with aura, not intractable, without status migrainosus: Secondary | ICD-10-CM | POA: Diagnosis not present

## 2023-02-17 DIAGNOSIS — M79602 Pain in left arm: Secondary | ICD-10-CM | POA: Diagnosis not present

## 2023-02-17 DIAGNOSIS — M25512 Pain in left shoulder: Secondary | ICD-10-CM | POA: Diagnosis not present

## 2023-02-17 DIAGNOSIS — S42202A Unspecified fracture of upper end of left humerus, initial encounter for closed fracture: Secondary | ICD-10-CM | POA: Diagnosis not present

## 2023-02-17 DIAGNOSIS — R293 Abnormal posture: Secondary | ICD-10-CM | POA: Diagnosis not present

## 2023-02-19 DIAGNOSIS — M25512 Pain in left shoulder: Secondary | ICD-10-CM | POA: Diagnosis not present

## 2023-02-19 DIAGNOSIS — R293 Abnormal posture: Secondary | ICD-10-CM | POA: Diagnosis not present

## 2023-02-19 DIAGNOSIS — S42202A Unspecified fracture of upper end of left humerus, initial encounter for closed fracture: Secondary | ICD-10-CM | POA: Diagnosis not present

## 2023-02-19 DIAGNOSIS — M79602 Pain in left arm: Secondary | ICD-10-CM | POA: Diagnosis not present

## 2023-02-24 DIAGNOSIS — M79602 Pain in left arm: Secondary | ICD-10-CM | POA: Diagnosis not present

## 2023-02-24 DIAGNOSIS — M25512 Pain in left shoulder: Secondary | ICD-10-CM | POA: Diagnosis not present

## 2023-02-24 DIAGNOSIS — S42202A Unspecified fracture of upper end of left humerus, initial encounter for closed fracture: Secondary | ICD-10-CM | POA: Diagnosis not present

## 2023-02-24 DIAGNOSIS — R293 Abnormal posture: Secondary | ICD-10-CM | POA: Diagnosis not present

## 2023-02-26 DIAGNOSIS — M7552 Bursitis of left shoulder: Secondary | ICD-10-CM | POA: Diagnosis not present

## 2023-02-26 DIAGNOSIS — S42242D 4-part fracture of surgical neck of left humerus, subsequent encounter for fracture with routine healing: Secondary | ICD-10-CM | POA: Diagnosis not present

## 2023-02-26 DIAGNOSIS — S42302D Unspecified fracture of shaft of humerus, left arm, subsequent encounter for fracture with routine healing: Secondary | ICD-10-CM | POA: Diagnosis not present

## 2023-02-28 DIAGNOSIS — M79602 Pain in left arm: Secondary | ICD-10-CM | POA: Diagnosis not present

## 2023-02-28 DIAGNOSIS — S42202A Unspecified fracture of upper end of left humerus, initial encounter for closed fracture: Secondary | ICD-10-CM | POA: Diagnosis not present

## 2023-02-28 DIAGNOSIS — M25512 Pain in left shoulder: Secondary | ICD-10-CM | POA: Diagnosis not present

## 2023-02-28 DIAGNOSIS — R293 Abnormal posture: Secondary | ICD-10-CM | POA: Diagnosis not present

## 2023-03-19 DIAGNOSIS — S42202A Unspecified fracture of upper end of left humerus, initial encounter for closed fracture: Secondary | ICD-10-CM | POA: Diagnosis not present

## 2023-03-19 DIAGNOSIS — M25512 Pain in left shoulder: Secondary | ICD-10-CM | POA: Diagnosis not present

## 2023-03-19 DIAGNOSIS — R293 Abnormal posture: Secondary | ICD-10-CM | POA: Diagnosis not present

## 2023-03-19 DIAGNOSIS — M79602 Pain in left arm: Secondary | ICD-10-CM | POA: Diagnosis not present

## 2023-03-21 DIAGNOSIS — S42202A Unspecified fracture of upper end of left humerus, initial encounter for closed fracture: Secondary | ICD-10-CM | POA: Diagnosis not present

## 2023-03-21 DIAGNOSIS — M25512 Pain in left shoulder: Secondary | ICD-10-CM | POA: Diagnosis not present

## 2023-03-21 DIAGNOSIS — M79602 Pain in left arm: Secondary | ICD-10-CM | POA: Diagnosis not present

## 2023-03-21 DIAGNOSIS — R293 Abnormal posture: Secondary | ICD-10-CM | POA: Diagnosis not present

## 2023-03-24 DIAGNOSIS — M79602 Pain in left arm: Secondary | ICD-10-CM | POA: Diagnosis not present

## 2023-03-24 DIAGNOSIS — R293 Abnormal posture: Secondary | ICD-10-CM | POA: Diagnosis not present

## 2023-03-24 DIAGNOSIS — M25512 Pain in left shoulder: Secondary | ICD-10-CM | POA: Diagnosis not present

## 2023-03-24 DIAGNOSIS — S42202A Unspecified fracture of upper end of left humerus, initial encounter for closed fracture: Secondary | ICD-10-CM | POA: Diagnosis not present

## 2023-03-25 DIAGNOSIS — E782 Mixed hyperlipidemia: Secondary | ICD-10-CM | POA: Diagnosis not present

## 2023-03-25 DIAGNOSIS — I10 Essential (primary) hypertension: Secondary | ICD-10-CM | POA: Diagnosis not present

## 2023-03-25 DIAGNOSIS — I2510 Atherosclerotic heart disease of native coronary artery without angina pectoris: Secondary | ICD-10-CM | POA: Diagnosis not present

## 2023-03-26 DIAGNOSIS — M25512 Pain in left shoulder: Secondary | ICD-10-CM | POA: Diagnosis not present

## 2023-03-26 DIAGNOSIS — S42202A Unspecified fracture of upper end of left humerus, initial encounter for closed fracture: Secondary | ICD-10-CM | POA: Diagnosis not present

## 2023-03-26 DIAGNOSIS — R293 Abnormal posture: Secondary | ICD-10-CM | POA: Diagnosis not present

## 2023-03-26 DIAGNOSIS — M79602 Pain in left arm: Secondary | ICD-10-CM | POA: Diagnosis not present

## 2023-04-09 DIAGNOSIS — S42242D 4-part fracture of surgical neck of left humerus, subsequent encounter for fracture with routine healing: Secondary | ICD-10-CM | POA: Diagnosis not present

## 2023-04-09 DIAGNOSIS — S42302D Unspecified fracture of shaft of humerus, left arm, subsequent encounter for fracture with routine healing: Secondary | ICD-10-CM | POA: Diagnosis not present

## 2023-04-09 DIAGNOSIS — M7552 Bursitis of left shoulder: Secondary | ICD-10-CM | POA: Diagnosis not present

## 2023-04-10 DIAGNOSIS — M79602 Pain in left arm: Secondary | ICD-10-CM | POA: Diagnosis not present

## 2023-04-10 DIAGNOSIS — M25512 Pain in left shoulder: Secondary | ICD-10-CM | POA: Diagnosis not present

## 2023-04-10 DIAGNOSIS — R293 Abnormal posture: Secondary | ICD-10-CM | POA: Diagnosis not present

## 2023-04-10 DIAGNOSIS — S42202A Unspecified fracture of upper end of left humerus, initial encounter for closed fracture: Secondary | ICD-10-CM | POA: Diagnosis not present

## 2023-04-16 DIAGNOSIS — R293 Abnormal posture: Secondary | ICD-10-CM | POA: Diagnosis not present

## 2023-04-16 DIAGNOSIS — M79602 Pain in left arm: Secondary | ICD-10-CM | POA: Diagnosis not present

## 2023-04-16 DIAGNOSIS — S42202A Unspecified fracture of upper end of left humerus, initial encounter for closed fracture: Secondary | ICD-10-CM | POA: Diagnosis not present

## 2023-04-16 DIAGNOSIS — M25512 Pain in left shoulder: Secondary | ICD-10-CM | POA: Diagnosis not present

## 2023-04-17 DIAGNOSIS — I2510 Atherosclerotic heart disease of native coronary artery without angina pectoris: Secondary | ICD-10-CM | POA: Diagnosis not present

## 2023-04-18 DIAGNOSIS — M170 Bilateral primary osteoarthritis of knee: Secondary | ICD-10-CM | POA: Diagnosis not present

## 2023-04-23 DIAGNOSIS — S42202A Unspecified fracture of upper end of left humerus, initial encounter for closed fracture: Secondary | ICD-10-CM | POA: Diagnosis not present

## 2023-04-23 DIAGNOSIS — M79602 Pain in left arm: Secondary | ICD-10-CM | POA: Diagnosis not present

## 2023-04-23 DIAGNOSIS — M25512 Pain in left shoulder: Secondary | ICD-10-CM | POA: Diagnosis not present

## 2023-04-23 DIAGNOSIS — R293 Abnormal posture: Secondary | ICD-10-CM | POA: Diagnosis not present

## 2023-05-15 DIAGNOSIS — I10 Essential (primary) hypertension: Secondary | ICD-10-CM | POA: Diagnosis not present

## 2023-05-15 DIAGNOSIS — E782 Mixed hyperlipidemia: Secondary | ICD-10-CM | POA: Diagnosis not present

## 2023-05-15 DIAGNOSIS — I5181 Takotsubo syndrome: Secondary | ICD-10-CM | POA: Diagnosis not present

## 2023-05-15 DIAGNOSIS — I2510 Atherosclerotic heart disease of native coronary artery without angina pectoris: Secondary | ICD-10-CM | POA: Diagnosis not present

## 2023-05-30 DIAGNOSIS — S61452A Open bite of left hand, initial encounter: Secondary | ICD-10-CM | POA: Diagnosis not present

## 2023-05-30 DIAGNOSIS — R252 Cramp and spasm: Secondary | ICD-10-CM | POA: Diagnosis not present

## 2023-05-30 DIAGNOSIS — W5501XA Bitten by cat, initial encounter: Secondary | ICD-10-CM | POA: Diagnosis not present

## 2023-06-16 DIAGNOSIS — M5432 Sciatica, left side: Secondary | ICD-10-CM | POA: Diagnosis not present

## 2023-06-26 ENCOUNTER — Encounter: Payer: Self-pay | Admitting: Family Medicine

## 2023-06-26 ENCOUNTER — Other Ambulatory Visit: Payer: Self-pay | Admitting: Family Medicine

## 2023-06-26 DIAGNOSIS — M79605 Pain in left leg: Secondary | ICD-10-CM | POA: Diagnosis not present

## 2023-06-26 DIAGNOSIS — M5416 Radiculopathy, lumbar region: Secondary | ICD-10-CM

## 2023-06-26 DIAGNOSIS — M438X6 Other specified deforming dorsopathies, lumbar region: Secondary | ICD-10-CM | POA: Diagnosis not present

## 2023-06-26 DIAGNOSIS — M5116 Intervertebral disc disorders with radiculopathy, lumbar region: Secondary | ICD-10-CM | POA: Diagnosis not present

## 2023-06-26 DIAGNOSIS — M48061 Spinal stenosis, lumbar region without neurogenic claudication: Secondary | ICD-10-CM | POA: Diagnosis not present

## 2023-06-26 DIAGNOSIS — I700 Atherosclerosis of aorta: Secondary | ICD-10-CM | POA: Diagnosis not present

## 2023-07-02 DIAGNOSIS — S42242D 4-part fracture of surgical neck of left humerus, subsequent encounter for fracture with routine healing: Secondary | ICD-10-CM | POA: Diagnosis not present

## 2023-07-02 DIAGNOSIS — S42302D Unspecified fracture of shaft of humerus, left arm, subsequent encounter for fracture with routine healing: Secondary | ICD-10-CM | POA: Diagnosis not present

## 2023-07-02 DIAGNOSIS — M7552 Bursitis of left shoulder: Secondary | ICD-10-CM | POA: Diagnosis not present

## 2023-07-17 DIAGNOSIS — M170 Bilateral primary osteoarthritis of knee: Secondary | ICD-10-CM | POA: Diagnosis not present

## 2023-07-18 ENCOUNTER — Ambulatory Visit
Admission: RE | Admit: 2023-07-18 | Discharge: 2023-07-18 | Disposition: A | Discharge status: Home / Self Care | Payer: PPO | Source: Ambulatory Visit | Attending: Family Medicine | Admitting: Family Medicine

## 2023-07-18 DIAGNOSIS — M5416 Radiculopathy, lumbar region: Secondary | ICD-10-CM

## 2023-07-18 DIAGNOSIS — M5126 Other intervertebral disc displacement, lumbar region: Secondary | ICD-10-CM | POA: Diagnosis not present

## 2023-07-24 DIAGNOSIS — M170 Bilateral primary osteoarthritis of knee: Secondary | ICD-10-CM | POA: Diagnosis not present

## 2023-07-31 DIAGNOSIS — M170 Bilateral primary osteoarthritis of knee: Secondary | ICD-10-CM | POA: Diagnosis not present

## 2023-08-06 ENCOUNTER — Other Ambulatory Visit: Payer: Self-pay

## 2023-08-06 ENCOUNTER — Emergency Department: Payer: PPO

## 2023-08-06 ENCOUNTER — Emergency Department
Admission: EM | Admit: 2023-08-06 | Discharge: 2023-08-07 | Disposition: A | Discharge status: Home / Self Care | Payer: PPO | Source: Home / Self Care | Attending: Emergency Medicine | Admitting: Emergency Medicine

## 2023-08-06 DIAGNOSIS — R509 Fever, unspecified: Secondary | ICD-10-CM | POA: Insufficient documentation

## 2023-08-06 DIAGNOSIS — R1111 Vomiting without nausea: Secondary | ICD-10-CM | POA: Diagnosis not present

## 2023-08-06 DIAGNOSIS — Z20822 Contact with and (suspected) exposure to covid-19: Secondary | ICD-10-CM | POA: Insufficient documentation

## 2023-08-06 DIAGNOSIS — N3000 Acute cystitis without hematuria: Secondary | ICD-10-CM

## 2023-08-06 DIAGNOSIS — N390 Urinary tract infection, site not specified: Secondary | ICD-10-CM | POA: Diagnosis not present

## 2023-08-06 DIAGNOSIS — R531 Weakness: Secondary | ICD-10-CM | POA: Insufficient documentation

## 2023-08-06 DIAGNOSIS — R739 Hyperglycemia, unspecified: Secondary | ICD-10-CM | POA: Diagnosis not present

## 2023-08-06 DIAGNOSIS — R0602 Shortness of breath: Secondary | ICD-10-CM | POA: Insufficient documentation

## 2023-08-06 DIAGNOSIS — R110 Nausea: Secondary | ICD-10-CM | POA: Diagnosis not present

## 2023-08-06 DIAGNOSIS — R112 Nausea with vomiting, unspecified: Secondary | ICD-10-CM | POA: Diagnosis not present

## 2023-08-06 DIAGNOSIS — R000 Tachycardia, unspecified: Secondary | ICD-10-CM | POA: Diagnosis not present

## 2023-08-06 DIAGNOSIS — R7881 Bacteremia: Secondary | ICD-10-CM | POA: Diagnosis not present

## 2023-08-06 LAB — COMPREHENSIVE METABOLIC PANEL
ALT: 33 U/L (ref 0–44)
AST: 47 U/L — ABNORMAL HIGH (ref 15–41)
Albumin: 3.5 g/dL (ref 3.5–5.0)
Alkaline Phosphatase: 74 U/L (ref 38–126)
Anion gap: 10 (ref 5–15)
BUN: 21 mg/dL (ref 8–23)
CO2: 21 mmol/L — ABNORMAL LOW (ref 22–32)
Calcium: 8.2 mg/dL — ABNORMAL LOW (ref 8.9–10.3)
Chloride: 101 mmol/L (ref 98–111)
Creatinine, Ser: 1.01 mg/dL — ABNORMAL HIGH (ref 0.44–1.00)
GFR, Estimated: 59 mL/min — ABNORMAL LOW (ref >60)
Glucose, Bld: 158 mg/dL — ABNORMAL HIGH (ref 70–99)
Potassium: 2.9 mmol/L — ABNORMAL LOW (ref 3.5–5.1)
Sodium: 132 mmol/L — ABNORMAL LOW (ref 135–145)
Total Bilirubin: 3.2 mg/dL — ABNORMAL HIGH (ref <1.2)
Total Protein: 6.8 g/dL (ref 6.5–8.1)

## 2023-08-06 LAB — CBC WITH DIFFERENTIAL/PLATELET
Abs Immature Granulocytes: 0.04 10*3/uL (ref 0.00–0.07)
Basophils Absolute: 0 10*3/uL (ref 0.0–0.1)
Basophils Relative: 0 %
Eosinophils Absolute: 0 10*3/uL (ref 0.0–0.5)
Eosinophils Relative: 0 %
HCT: 30.7 % — ABNORMAL LOW (ref 36.0–46.0)
Hemoglobin: 9.6 g/dL — ABNORMAL LOW (ref 12.0–15.0)
Immature Granulocytes: 0 %
Lymphocytes Relative: 2 %
Lymphs Abs: 0.3 10*3/uL — ABNORMAL LOW (ref 0.7–4.0)
MCH: 20.8 pg — ABNORMAL LOW (ref 26.0–34.0)
MCHC: 31.3 g/dL (ref 30.0–36.0)
MCV: 66.5 fL — ABNORMAL LOW (ref 80.0–100.0)
Monocytes Absolute: 0.1 10*3/uL (ref 0.1–1.0)
Monocytes Relative: 1 %
Neutro Abs: 10.3 10*3/uL — ABNORMAL HIGH (ref 1.7–7.7)
Neutrophils Relative %: 97 %
Platelets: 172 10*3/uL (ref 150–400)
RBC: 4.62 MIL/uL (ref 3.87–5.11)
RDW: 14.5 % (ref 11.5–15.5)
WBC: 10.7 10*3/uL — ABNORMAL HIGH (ref 4.0–10.5)
nRBC: 0 % (ref 0.0–0.2)

## 2023-08-06 LAB — LACTIC ACID, PLASMA: Lactic Acid, Venous: 2.1 mmol/L (ref 0.5–1.9)

## 2023-08-06 LAB — RESP PANEL BY RT-PCR (RSV, FLU A&B, COVID)  RVPGX2
Influenza A by PCR: NEGATIVE
Influenza B by PCR: NEGATIVE
Resp Syncytial Virus by PCR: NEGATIVE
SARS Coronavirus 2 by RT PCR: NEGATIVE

## 2023-08-06 LAB — PROTIME-INR
INR: 1.3 — ABNORMAL HIGH (ref 0.8–1.2)
Prothrombin Time: 16.1 s — ABNORMAL HIGH (ref 11.4–15.2)

## 2023-08-06 MED ORDER — LACTATED RINGERS IV BOLUS
1000.0000 mL | Freq: Once | INTRAVENOUS | Status: AC
  Administered 2023-08-06: 1000 mL via INTRAVENOUS

## 2023-08-06 NOTE — ED Provider Notes (Signed)
[facility] Provider Note    Event Date/Time   First MD Initiated Contact with Patient 08/06/23 2114     (approximate)   History   Fever, Shortness of Breath, and Weakness   HPI  Lydia Elliott is a 72 y.o. female who presents to the emergency department today because of concerns for weakness, fever, shortness of breath.  Patient states that she for started feeling bad 2 days ago.  It has progressively gotten worse.  She has full body fatigue.  Says been accompanied by some fevers.  Today the patient*developing some shortness of breath and chills.  When family checked on her today they did think she was also confused.  Patient has had some feelings of urinary urgency. No known sick contacts.      Physical Exam   Triage Vital Signs: ED Triage Vitals  Encounter Vitals Group     BP 08/06/23 2050 (!) 112/48     Systolic BP Percentile --      Diastolic BP Percentile --      Pulse Rate 08/06/23 2050 100     Resp 08/06/23 2050 18     Temp 08/06/23 2050 99.7 F (37.6 C)     Temp Source 08/06/23 2050 Oral     SpO2 08/06/23 2050 95 %     Weight 08/06/23 2104 145 lb (65.8 kg)     Height 08/06/23 2104 5\' 2"  (1.575 m)     Head Circumference --      Peak Flow --      Pain Score 08/06/23 2104 0     Pain Loc --      Pain Education --      Exclude from Growth Chart --     Most recent vital signs: Vitals:   08/06/23 2050 08/06/23 2139  BP: (!) 112/48 (!) 103/45  Pulse: 100 (!) 101  Resp: 18 19  Temp: 99.7 F (37.6 C) 98.3 F (36.8 C)  SpO2: 95% 96%   General: Awake, alert, oriented. CV:  Good peripheral perfusion. Regular rate and rhythm. Resp:  Normal effort. Lungs clear. Abd:  No distention.    ED Results / Procedures / Treatments   Labs (all labs ordered are listed, but only abnormal results are displayed) Labs Reviewed  COMPREHENSIVE METABOLIC PANEL - Abnormal; Notable for the following components:      Result Value   Sodium 132 (*)     Potassium 2.9 (*)    CO2 21 (*)    Glucose, Bld 158 (*)    Creatinine, Ser 1.01 (*)    Calcium 8.2 (*)    AST 47 (*)    Total Bilirubin 3.2 (*)    GFR, Estimated 59 (*)    All other components within normal limits  LACTIC ACID, PLASMA - Abnormal; Notable for the following components:   Lactic Acid, Venous 2.1 (*)    All other components within normal limits  CBC WITH DIFFERENTIAL/PLATELET - Abnormal; Notable for the following components:   WBC 10.7 (*)    Hemoglobin 9.6 (*)    HCT 30.7 (*)    MCV 66.5 (*)    MCH 20.8 (*)    Neutro Abs 10.3 (*)    Lymphs Abs 0.3 (*)    All other components within normal limits  PROTIME-INR - Abnormal; Notable for the following components:   Prothrombin Time 16.1 (*)    INR 1.3 (*)    All other components within normal limits  CULTURE, BLOOD (ROUTINE  X 2)  CULTURE, BLOOD (ROUTINE X 2)  RESP PANEL BY RT-PCR (RSV, FLU A&B, COVID)  RVPGX2  LACTIC ACID, PLASMA  URINALYSIS, W/ REFLEX TO CULTURE (INFECTION SUSPECTED)     EKG  I, Phineas Semen, attending physician, personally viewed and interpreted this EKG  EKG Time: 2056 Rate: 111 Rhythm: sinus tachycardia with PAC Axis: left axis deviation Intervals: qtc 467 QRS: LVH, q waves III, aVF ST changes: no st elevation Impression: abnormal ekg    RADIOLOGY I independently interpreted and visualized the CXR. My interpretation: No pneumonia Radiology interpretation:  IMPRESSION:  No active cardiopulmonary disease.      PROCEDURES:  Critical Care performed: No    MEDICATIONS ORDERED IN ED: Medications  lactated ringers bolus 1,000 mL (has no administration in time range)     IMPRESSION / MDM / ASSESSMENT AND PLAN / ED COURSE  I reviewed the triage vital signs and the nursing notes.                              Differential diagnosis includes, but is not limited to, viral infection, UTI, dehydration, pneumonia  Patient's presentation is most consistent with acute  presentation with potential threat to life or bodily function.   The patient is on the cardiac monitor to evaluate for evidence of arrhythmia and/or significant heart rate changes.  Patient presents to the emergency department today because of concerns for weakness, fevers and some shortness of breath.  On exam patient is awake and alert.  Patient was afebrile here.  Blood work without any significant leukocytosis.  Very minimal lactic acidosis.  This could be secondary to infection or dehydration.  Will give patient IV fluids.  Respiratory panel was negative.  Chest x-ray without pneumonia.  Awaiting urine and repeat lactic at time of signout.      FINAL CLINICAL IMPRESSION(S) / ED DIAGNOSES   Final diagnoses:  Weakness      Note:  This document was prepared using Dragon voice recognition software and may include unintentional dictation errors.    Phineas Semen, MD 08/06/23 [phone]

## 2023-08-06 NOTE — ED Triage Notes (Signed)
Pt BIB EMS for fever, chills, delirium, n/v, generalized weakness. 102 tmax at home.

## 2023-08-06 NOTE — ED Triage Notes (Signed)
FIRST NURSE NOTE:  Pt arrived via EMS c/o fever, n/v, weakness x 24-48 hours.  102 temp at home, gave tylenol 1200mg    Oral 99.1 with EMS 30-45 after tylenol  4mg  zofran IV with EMS  267 CBG with EMS.   P-110  140/70  RA 88-90% no respiratory distress, lungs clear, placed on 2L EMS.

## 2023-08-07 LAB — URINALYSIS, W/ REFLEX TO CULTURE (INFECTION SUSPECTED)
Bilirubin Urine: NEGATIVE
Glucose, UA: NEGATIVE mg/dL
Ketones, ur: NEGATIVE mg/dL
Nitrite: NEGATIVE
Protein, ur: NEGATIVE mg/dL
Specific Gravity, Urine: 1.008 (ref 1.005–1.030)
pH: 6 (ref 5.0–8.0)

## 2023-08-07 LAB — LACTIC ACID, PLASMA: Lactic Acid, Venous: 1.2 mmol/L (ref 0.5–1.9)

## 2023-08-07 MED ORDER — CEPHALEXIN 500 MG PO CAPS: 500.0000 mg | ORAL_CAPSULE | Freq: Three times a day (TID) | ORAL | 0 refills | Status: DC

## 2023-08-07 MED ORDER — CEFTRIAXONE SODIUM 1 G IJ SOLR
1.0000 g | Freq: Once | INTRAMUSCULAR | Status: AC
  Administered 2023-08-07: 1 g via INTRAVENOUS
  Filled 2023-08-07: qty 10

## 2023-08-07 MED ORDER — ONDANSETRON 4 MG PO TBDP: 4.0000 mg | ORAL_TABLET | Freq: Three times a day (TID) | ORAL | 0 refills | Status: DC | PRN

## 2023-08-07 MED ORDER — POTASSIUM CHLORIDE 20 MEQ PO PACK: 40.0000 meq | PACK | Freq: Two times a day (BID) | ORAL | Status: DC

## 2023-08-07 MED ORDER — POTASSIUM CHLORIDE CRYS ER 20 MEQ PO TBCR
40.0000 meq | EXTENDED_RELEASE_TABLET | Freq: Once | ORAL | Status: AC
  Administered 2023-08-07: 40 meq via ORAL
  Filled 2023-08-07: qty 2

## 2023-08-07 NOTE — ED Provider Notes (Signed)
Patient received in signout from Dr. Derrill Kay pending UA and repeat lactic acid.  Repeat lactic acid is downtrending, she feels better after fluids.  Urine does show concerning features for UTI, we will send this for culture and start her on antibiotics.  On reassessment she reports feeling much better and asking to go home.  We discussed Keflex antibiotic, Zofran as needed and return precautions for the ED.  She is suitable for outpatient management   Delton Prairie, MD 08/07/23 [phone]

## 2023-08-07 NOTE — Discharge Instructions (Addendum)
You are being discharged with Keflex antibiotic to take 3 times daily for 5 more days.  Zofran nausea medicine to use as needed  Finish the Keflex prescription, all 15 pills.  If your symptoms worsen despite this and please return to the ED

## 2023-08-08 ENCOUNTER — Telehealth: Payer: Self-pay | Admitting: Emergency Medicine

## 2023-08-08 ENCOUNTER — Inpatient Hospital Stay
Admission: EM | Admit: 2023-08-08 | Discharge: 2023-08-10 | DRG: 690 | Disposition: A | Discharge status: Home / Self Care | Payer: PPO | Attending: Internal Medicine | Admitting: Internal Medicine

## 2023-08-08 ENCOUNTER — Encounter: Payer: Self-pay | Admitting: Emergency Medicine

## 2023-08-08 ENCOUNTER — Other Ambulatory Visit: Payer: Self-pay

## 2023-08-08 DIAGNOSIS — E8809 Other disorders of plasma-protein metabolism, not elsewhere classified: Secondary | ICD-10-CM | POA: Diagnosis present

## 2023-08-08 DIAGNOSIS — K219 Gastro-esophageal reflux disease without esophagitis: Secondary | ICD-10-CM | POA: Diagnosis not present

## 2023-08-08 DIAGNOSIS — I700 Atherosclerosis of aorta: Secondary | ICD-10-CM | POA: Diagnosis not present

## 2023-08-08 DIAGNOSIS — E876 Hypokalemia: Secondary | ICD-10-CM | POA: Diagnosis not present

## 2023-08-08 DIAGNOSIS — I2510 Atherosclerotic heart disease of native coronary artery without angina pectoris: Secondary | ICD-10-CM | POA: Diagnosis not present

## 2023-08-08 DIAGNOSIS — Z888 Allergy status to other drugs, medicaments and biological substances status: Secondary | ICD-10-CM

## 2023-08-08 DIAGNOSIS — G4700 Insomnia, unspecified: Secondary | ICD-10-CM | POA: Diagnosis present

## 2023-08-08 DIAGNOSIS — Z801 Family history of malignant neoplasm of trachea, bronchus and lung: Secondary | ICD-10-CM

## 2023-08-08 DIAGNOSIS — B9620 Unspecified Escherichia coli [E. coli] as the cause of diseases classified elsewhere: Principal | ICD-10-CM | POA: Diagnosis present

## 2023-08-08 DIAGNOSIS — Z79899 Other long term (current) drug therapy: Secondary | ICD-10-CM | POA: Diagnosis not present

## 2023-08-08 DIAGNOSIS — Z836 Family history of other diseases of the respiratory system: Secondary | ICD-10-CM

## 2023-08-08 DIAGNOSIS — K589 Irritable bowel syndrome without diarrhea: Secondary | ICD-10-CM | POA: Diagnosis present

## 2023-08-08 DIAGNOSIS — Z66 Do not resuscitate: Secondary | ICD-10-CM | POA: Diagnosis present

## 2023-08-08 DIAGNOSIS — Z833 Family history of diabetes mellitus: Secondary | ICD-10-CM

## 2023-08-08 DIAGNOSIS — E785 Hyperlipidemia, unspecified: Secondary | ICD-10-CM | POA: Insufficient documentation

## 2023-08-08 DIAGNOSIS — L4050 Arthropathic psoriasis, unspecified: Secondary | ICD-10-CM | POA: Diagnosis present

## 2023-08-08 DIAGNOSIS — Z1612 Extended spectrum beta lactamase (ESBL) resistance: Secondary | ICD-10-CM | POA: Diagnosis not present

## 2023-08-08 DIAGNOSIS — F419 Anxiety disorder, unspecified: Secondary | ICD-10-CM | POA: Insufficient documentation

## 2023-08-08 DIAGNOSIS — N390 Urinary tract infection, site not specified: Secondary | ICD-10-CM | POA: Diagnosis not present

## 2023-08-08 DIAGNOSIS — F418 Other specified anxiety disorders: Secondary | ICD-10-CM | POA: Diagnosis not present

## 2023-08-08 DIAGNOSIS — R531 Weakness: Secondary | ICD-10-CM | POA: Diagnosis not present

## 2023-08-08 DIAGNOSIS — Z955 Presence of coronary angioplasty implant and graft: Secondary | ICD-10-CM

## 2023-08-08 DIAGNOSIS — Z9049 Acquired absence of other specified parts of digestive tract: Secondary | ICD-10-CM

## 2023-08-08 DIAGNOSIS — Z87891 Personal history of nicotine dependence: Secondary | ICD-10-CM

## 2023-08-08 DIAGNOSIS — B9629 Other Escherichia coli [E. coli] as the cause of diseases classified elsewhere: Secondary | ICD-10-CM | POA: Diagnosis not present

## 2023-08-08 DIAGNOSIS — Z1152 Encounter for screening for COVID-19: Secondary | ICD-10-CM

## 2023-08-08 DIAGNOSIS — Z832 Family history of diseases of the blood and blood-forming organs and certain disorders involving the immune mechanism: Secondary | ICD-10-CM

## 2023-08-08 DIAGNOSIS — Z823 Family history of stroke: Secondary | ICD-10-CM | POA: Diagnosis not present

## 2023-08-08 DIAGNOSIS — Z860100 Personal history of colon polyps, unspecified: Secondary | ICD-10-CM

## 2023-08-08 DIAGNOSIS — D569 Thalassemia, unspecified: Secondary | ICD-10-CM | POA: Diagnosis present

## 2023-08-08 DIAGNOSIS — I10 Essential (primary) hypertension: Secondary | ICD-10-CM | POA: Diagnosis not present

## 2023-08-08 DIAGNOSIS — R7881 Bacteremia: Principal | ICD-10-CM | POA: Diagnosis present

## 2023-08-08 DIAGNOSIS — Z7983 Long term (current) use of bisphosphonates: Secondary | ICD-10-CM

## 2023-08-08 DIAGNOSIS — F32A Depression, unspecified: Secondary | ICD-10-CM | POA: Insufficient documentation

## 2023-08-08 DIAGNOSIS — Z8249 Family history of ischemic heart disease and other diseases of the circulatory system: Secondary | ICD-10-CM | POA: Diagnosis not present

## 2023-08-08 DIAGNOSIS — Z83438 Family history of other disorder of lipoprotein metabolism and other lipidemia: Secondary | ICD-10-CM

## 2023-08-08 DIAGNOSIS — Z8349 Family history of other endocrine, nutritional and metabolic diseases: Secondary | ICD-10-CM

## 2023-08-08 HISTORY — DX: Hyperlipidemia, unspecified: E78.5

## 2023-08-08 LAB — CBC WITH DIFFERENTIAL/PLATELET
Abs Immature Granulocytes: 0.05 10*3/uL (ref 0.00–0.07)
Basophils Absolute: 0.1 10*3/uL (ref 0.0–0.1)
Basophils Relative: 1 %
Eosinophils Absolute: 0.1 10*3/uL (ref 0.0–0.5)
Eosinophils Relative: 1 %
HCT: 31 % — ABNORMAL LOW (ref 36.0–46.0)
Hemoglobin: 9.5 g/dL — ABNORMAL LOW (ref 12.0–15.0)
Immature Granulocytes: 1 %
Lymphocytes Relative: 8 %
Lymphs Abs: 0.6 10*3/uL — ABNORMAL LOW (ref 0.7–4.0)
MCH: 20.6 pg — ABNORMAL LOW (ref 26.0–34.0)
MCHC: 30.6 g/dL (ref 30.0–36.0)
MCV: 67.1 fL — ABNORMAL LOW (ref 80.0–100.0)
Monocytes Absolute: 0.7 10*3/uL (ref 0.1–1.0)
Monocytes Relative: 10 %
Neutro Abs: 5.7 10*3/uL (ref 1.7–7.7)
Neutrophils Relative %: 79 %
Platelets: 168 10*3/uL (ref 150–400)
RBC: 4.62 MIL/uL (ref 3.87–5.11)
RDW: 14.6 % (ref 11.5–15.5)
Smear Review: NORMAL
WBC: 7.1 10*3/uL (ref 4.0–10.5)
nRBC: 0 % (ref 0.0–0.2)

## 2023-08-08 LAB — BLOOD CULTURE ID PANEL (REFLEXED) - BCID2

## 2023-08-08 LAB — URINALYSIS, W/ REFLEX TO CULTURE (INFECTION SUSPECTED)
Bacteria, UA: NONE SEEN
Bilirubin Urine: NEGATIVE
Glucose, UA: NEGATIVE mg/dL
Ketones, ur: NEGATIVE mg/dL
Nitrite: NEGATIVE
Protein, ur: NEGATIVE mg/dL
Specific Gravity, Urine: 1.013 (ref 1.005–1.030)
pH: 6 (ref 5.0–8.0)

## 2023-08-08 LAB — COMPREHENSIVE METABOLIC PANEL
ALT: 29 U/L (ref 0–44)
AST: 32 U/L (ref 15–41)
Albumin: 3.3 g/dL — ABNORMAL LOW (ref 3.5–5.0)
Alkaline Phosphatase: 69 U/L (ref 38–126)
Anion gap: 8 (ref 5–15)
BUN: 15 mg/dL (ref 8–23)
CO2: 24 mmol/L (ref 22–32)
Calcium: 8.3 mg/dL — ABNORMAL LOW (ref 8.9–10.3)
Chloride: 105 mmol/L (ref 98–111)
Creatinine, Ser: 0.94 mg/dL (ref 0.44–1.00)
GFR, Estimated: >60 mL/min (ref >60)
Glucose, Bld: 116 mg/dL — ABNORMAL HIGH (ref 70–99)
Potassium: 2.9 mmol/L — ABNORMAL LOW (ref 3.5–5.1)
Sodium: 137 mmol/L (ref 135–145)
Total Bilirubin: 1.1 mg/dL (ref <1.2)
Total Protein: 6.5 g/dL (ref 6.5–8.1)

## 2023-08-08 LAB — MAGNESIUM: Magnesium: 1.6 mg/dL — ABNORMAL LOW (ref 1.7–2.4)

## 2023-08-08 LAB — LACTIC ACID, PLASMA: Lactic Acid, Venous: 1.5 mmol/L (ref 0.5–1.9)

## 2023-08-08 MED ORDER — SODIUM CHLORIDE 0.9 % IV SOLN
1.0000 g | Freq: Two times a day (BID) | INTRAVENOUS | Status: DC
  Administered 2023-08-08 – 2023-08-09 (×2): 1 g via INTRAVENOUS
  Filled 2023-08-08 (×2): qty 20

## 2023-08-08 MED ORDER — POTASSIUM CHLORIDE IN NACL 20-0.9 MEQ/L-% IV SOLN
INTRAVENOUS | Status: DC
  Filled 2023-08-08: qty 1000

## 2023-08-08 MED ORDER — BUSPIRONE HCL 10 MG PO TABS
5.0000 mg | ORAL_TABLET | Freq: Three times a day (TID) | ORAL | Status: DC
  Administered 2023-08-09 – 2023-08-10 (×5): 5 mg via ORAL
  Filled 2023-08-08 (×5): qty 1

## 2023-08-08 MED ORDER — ATORVASTATIN CALCIUM 20 MG PO TABS
20.0000 mg | ORAL_TABLET | Freq: Every day | ORAL | Status: DC
  Administered 2023-08-09 – 2023-08-10 (×2): 20 mg via ORAL
  Filled 2023-08-08 (×2): qty 1

## 2023-08-08 MED ORDER — ACETAMINOPHEN 650 MG RE SUPP: 650.0000 mg | Freq: Four times a day (QID) | RECTAL | Status: DC | PRN

## 2023-08-08 MED ORDER — POTASSIUM CHLORIDE 20 MEQ PO PACK
40.0000 meq | PACK | Freq: Once | ORAL | Status: AC
  Administered 2023-08-09: 40 meq via ORAL
  Filled 2023-08-08: qty 2

## 2023-08-08 MED ORDER — POLYETHYLENE GLYCOL 3350 17 G PO PACK: 17.0000 g | PACK | Freq: Every day | ORAL | Status: DC | PRN

## 2023-08-08 MED ORDER — MAGNESIUM SULFATE 2 GM/50ML IV SOLN
2.0000 g | Freq: Once | INTRAVENOUS | Status: AC
  Administered 2023-08-08: 2 g via INTRAVENOUS
  Filled 2023-08-08: qty 50

## 2023-08-08 MED ORDER — ESCITALOPRAM OXALATE 10 MG PO TABS
20.0000 mg | ORAL_TABLET | Freq: Every day | ORAL | Status: DC
  Administered 2023-08-09 – 2023-08-10 (×2): 20 mg via ORAL
  Filled 2023-08-08 (×2): qty 2

## 2023-08-08 MED ORDER — CARVEDILOL 3.125 MG PO TABS
3.1250 mg | ORAL_TABLET | Freq: Two times a day (BID) | ORAL | Status: DC
  Administered 2023-08-09 – 2023-08-10 (×3): 3.125 mg via ORAL
  Filled 2023-08-08 (×3): qty 1

## 2023-08-08 MED ORDER — LOSARTAN POTASSIUM 50 MG PO TABS
25.0000 mg | ORAL_TABLET | Freq: Every day | ORAL | Status: DC
Start: 2023-08-09 — End: 2023-08-10
  Administered 2023-08-09 – 2023-08-10 (×2): 25 mg via ORAL
  Filled 2023-08-08 (×2): qty 1

## 2023-08-08 MED ORDER — MAGNESIUM HYDROXIDE 400 MG/5ML PO SUSP: 30.0000 mL | Freq: Every day | ORAL | Status: DC | PRN

## 2023-08-08 MED ORDER — ALPRAZOLAM 0.25 MG PO TABS: 0.2500 mg | ORAL_TABLET | Freq: Two times a day (BID) | ORAL | Status: DC | PRN

## 2023-08-08 MED ORDER — ONDANSETRON HCL 4 MG/2ML IJ SOLN: 4.0000 mg | Freq: Four times a day (QID) | INTRAMUSCULAR | Status: DC | PRN

## 2023-08-08 MED ORDER — POTASSIUM CHLORIDE 10 MEQ/100ML IV SOLN
10.0000 meq | INTRAVENOUS | Status: AC
  Administered 2023-08-08 (×2): 10 meq via INTRAVENOUS
  Filled 2023-08-08 (×2): qty 100

## 2023-08-08 MED ORDER — VITAMIN D 25 MCG (1000 UNIT) PO TABS
5000.0000 [IU] | ORAL_TABLET | Freq: Every day | ORAL | Status: DC
  Administered 2023-08-09 – 2023-08-10 (×2): 5000 [IU] via ORAL
  Filled 2023-08-08 (×2): qty 5

## 2023-08-08 MED ORDER — PANTOPRAZOLE SODIUM 40 MG PO TBEC
40.0000 mg | DELAYED_RELEASE_TABLET | Freq: Every day | ORAL | Status: DC
  Administered 2023-08-09 – 2023-08-10 (×2): 40 mg via ORAL
  Filled 2023-08-08 (×2): qty 1

## 2023-08-08 MED ORDER — POTASSIUM CHLORIDE CRYS ER 20 MEQ PO TBCR
40.0000 meq | EXTENDED_RELEASE_TABLET | Freq: Once | ORAL | Status: AC
  Administered 2023-08-08: 40 meq via ORAL
  Filled 2023-08-08: qty 2

## 2023-08-08 MED ORDER — TRAZODONE HCL 50 MG PO TABS
25.0000 mg | ORAL_TABLET | Freq: Every evening | ORAL | Status: DC | PRN
  Administered 2023-08-09 (×2): 25 mg via ORAL
  Filled 2023-08-08 (×2): qty 1

## 2023-08-08 MED ORDER — ONDANSETRON HCL 4 MG PO TABS: 4.0000 mg | ORAL_TABLET | Freq: Four times a day (QID) | ORAL | Status: DC | PRN

## 2023-08-08 MED ORDER — VITAMIN C 500 MG PO TABS
1000.0000 mg | ORAL_TABLET | Freq: Every day | ORAL | Status: DC
  Administered 2023-08-09 – 2023-08-10 (×2): 1000 mg via ORAL
  Filled 2023-08-08 (×2): qty 2

## 2023-08-08 MED ORDER — ENOXAPARIN SODIUM 40 MG/0.4ML IJ SOSY
40.0000 mg | PREFILLED_SYRINGE | INTRAMUSCULAR | Status: DC
  Administered 2023-08-08 – 2023-08-09 (×2): 40 mg via SUBCUTANEOUS
  Filled 2023-08-08 (×2): qty 0.4

## 2023-08-08 MED ORDER — ACETAMINOPHEN 325 MG PO TABS
650.0000 mg | ORAL_TABLET | Freq: Four times a day (QID) | ORAL | Status: DC | PRN
  Administered 2023-08-10: 650 mg via ORAL
  Filled 2023-08-08: qty 2

## 2023-08-08 MED ORDER — SODIUM CHLORIDE 0.9 % IV SOLN
500.0000 mg | Freq: Three times a day (TID) | INTRAVENOUS | Status: DC
  Filled 2023-08-08 (×2): qty 10

## 2023-08-08 NOTE — ED Notes (Signed)
 CCMD called to admit patient to monitor

## 2023-08-08 NOTE — ED Provider Notes (Signed)
[facility] Provider Note    Event Date/Time   First MD Initiated Contact with Patient 08/08/23 1825     (approximate)   History   Abnormal Lab   HPI  Lydia Elliott is a 72 year old female with history of CAD, HTN, anemia presenting to the emergency department for evaluation of fever and positive blood culture.  Patient was seen in our ER on Christmas Day with malaise and fevers.  She was found to have a UTI, but felt improved after receiving symptomatic treatment here.  She was discharged home on Keflex.  She received a call today that her blood culture had returned positive and was instructed to return to the ER.  Reports that since discharge she has continued to feel generally weak with intermittent fevers with a Tmax at home of 100.6.  Last took Tylenol this morning.  Has intermittently been using Zofran for nausea.  No vomiting, abdominal pain, diarrhea.  I did review her culture data.  Her urine culture sent yesterday demonstrated greater than 100,000 colonies of E. coli with susceptibilities pending.  Her blood cultures from 12/25 demonstrate gram-negative rods in 2 out of 2 bottles.  ID with positive Enterobacter and E. coli with CTX-M ESBL with recommendation for carbapenem as initial therapy.    Physical Exam   Triage Vital Signs: ED Triage Vitals  Encounter Vitals Group     BP 08/08/23 1330 (!) 113/43     Systolic BP Percentile --      Diastolic BP Percentile --      Pulse Rate 08/08/23 1330 83     Resp 08/08/23 1330 16     Temp 08/08/23 1330 98.6 F (37 C)     Temp Source 08/08/23 1330 Oral     SpO2 08/08/23 1330 97 %     Weight 08/08/23 1332 144 lb 15.9 oz (65.8 kg)     Height --      Head Circumference --      Peak Flow --      Pain Score 08/08/23 1332 0     Pain Loc --      Pain Education --      Exclude from Growth Chart --     Most recent vital signs: Vitals:   08/08/23 1330 08/08/23 1815  BP: (!) 113/43 105/79  Pulse: 83 80   Resp: 16 18  Temp: 98.6 F (37 C) 99.3 F (37.4 C)  SpO2: 97% 94%     General: Awake, interactive  CV:  Regular rate, good peripheral perfusion.  Resp:  Unlabored respirations, lungs clear to auscultation Abd:  Nondistended, soft, nontender Neuro:  Symmetric facial movement, fluid speech   ED Results / Procedures / Treatments   Labs (all labs ordered are listed, but only abnormal results are displayed) Labs Reviewed  COMPREHENSIVE METABOLIC PANEL - Abnormal; Notable for the following components:      Result Value   Potassium 2.9 (*)    Glucose, Bld 116 (*)    Calcium 8.3 (*)    Albumin 3.3 (*)    All other components within normal limits  CBC WITH DIFFERENTIAL/PLATELET - Abnormal; Notable for the following components:   Hemoglobin 9.5 (*)    HCT 31.0 (*)    MCV 67.1 (*)    MCH 20.6 (*)    Lymphs Abs 0.6 (*)    All other components within normal limits  CULTURE, BLOOD (ROUTINE X 2)  CULTURE, BLOOD (ROUTINE X 2)  LACTIC ACID, PLASMA  URINALYSIS, W/ REFLEX TO CULTURE (INFECTION SUSPECTED)  MAGNESIUM     EKG EKG independently reviewed interpreted by myself (ER attending) demonstrates:    RADIOLOGY Imaging independently reviewed and interpreted by myself demonstrates:    PROCEDURES:  Critical Care performed: Yes, see critical care procedure note(s)  CRITICAL CARE Performed by: Trinna Post   Total critical care time: 31 minutes  Critical care time was exclusive of separately billable procedures and treating other patients.  Critical care was necessary to treat or prevent imminent or life-threatening deterioration.  Critical care was time spent personally by me on the following activities: development of treatment plan with patient and/or surrogate as well as nursing, discussions with consultants, evaluation of patient's response to treatment, examination of patient, obtaining history from patient or surrogate, ordering and performing treatments and  interventions, ordering and review of laboratory studies, ordering and review of radiographic studies, pulse oximetry and re-evaluation of patient's condition.   Procedures   MEDICATIONS ORDERED IN ED: Medications  meropenem (MERREM) 500 mg in sodium chloride 0.9 % 100 mL IVPB (has no administration in time range)  potassium chloride SA (KLOR-CON M) CR tablet 40 mEq (has no administration in time range)  potassium chloride 10 mEq in 100 mL IVPB (has no administration in time range)     IMPRESSION / MDM / ASSESSMENT AND PLAN / ED COURSE  I reviewed the triage vital signs and the nursing notes.  Differential diagnosis includes, but is not limited to, UTI, E. coli bacteremia, physical exam not suggestive of pyelonephritis or obstructing stone  Patient's presentation is most consistent with acute presentation with potential threat to life or bodily function.  72 year old female presenting with fevers and malaise found to have positive blood cultures.  Culture data demonstrating E. coli bacteremia with ESBL resistance to ceftriaxone.  Repeat blood cultures ordered as well as meropenem ordered per recommendation.  Patient fortunately not meeting sepsis criteria currently.  Labs also with hypokalemia, ordered for IV and oral repletion.  Will reach out to hospitalist team.  Case reviewed with Dr. Arville Care.  He will evaluate the patient for anticipated admission.      FINAL CLINICAL IMPRESSION(S) / ED DIAGNOSES   Final diagnoses:  E. coli bacteremia  E. coli UTI  Weakness     Rx / DC Orders   ED Discharge Orders     None        Note:  This document was prepared using Dragon voice recognition software and may include unintentional dictation errors.   Trinna Post, MD 08/08/23 [phone]

## 2023-08-08 NOTE — Assessment & Plan Note (Signed)
-   Magnesium will be replaced.   

## 2023-08-08 NOTE — Assessment & Plan Note (Signed)
-   We will continue statin therapy. 

## 2023-08-08 NOTE — Assessment & Plan Note (Signed)
-   We will continue BuSpar, Xanax and Zyprexa.

## 2023-08-08 NOTE — ED Notes (Signed)
Family to desk stating "I am taking her somewhere else. We were called and said she has ECOLI and needed treatment right away". This RN advised she is the next to be seen on our main side when a bed opens up. Pt agreed to stay.

## 2023-08-08 NOTE — ED Provider Triage Note (Signed)
Emergency Medicine Provider Triage Evaluation Note  Lydia Elliott , a 72 y.o. female  was evaluated in triage.  Pt complains of e coli in her blood. Patient had blood cultures drawn while at the ED on 12/25 which grew e coli so she was instructed to come back to the ED.  Review of Systems  Positive: fatigue Negative:   Physical Exam  There were no vitals taken for this visit. Gen:   Awake, no distress   Resp:  Normal effort  MSK:   Moves extremities without difficulty  Other:    Medical Decision Making  Medically screening exam initiated at 1:30 PM.  Appropriate orders placed.  Lydia Elliott was informed that the remainder of the evaluation will be completed by another provider, this initial triage assessment does not replace that evaluation, and the importance of remaining in the ED until their evaluation is complete.     Lydia Ali, PA-C 08/08/23 1336

## 2023-08-08 NOTE — H&P (Addendum)
[place]   PATIENT NAME: Lydia Elliott    MR#:  [id number]  DATE OF BIRTH:  17-Feb-1951  DATE OF ADMISSION:  08/08/2023  PRIMARY CARE PHYSICIAN: Mick Sell, MD   Patient is coming from: Home  REQUESTING/REFERRING PHYSICIAN: Trinna Post, MD  CHIEF COMPLAINT:   Chief Complaint  Patient presents with   Abnormal Lab    HISTORY OF PRESENT ILLNESS:  Lydia Elliott is a 72 y.o. Caucasian female with medical history significant for anxiety, depression, CAD, GERD, hypertension, dyslipidemia, osteoarthritis, thalassemia and psoriatic arthritis as well as IBS, who presented to the emergency room with acute onset of feeling sick for the last few days with associated intermittent fever with a Tmax of 102.  She admitted to urinary frequency without urgency or dysuria or hematuria or flank pain.  She was noted to be delirious by her daughter on Christmas eve.  She was seen in ER on Christmas evening and diagnosed with UTI.  She was prescribed p.o. Keflex which she has been taking.  She continues to have intermittent fevers.  She was called to come to the ER as she had 2 of 2 blood cultures growing ESBL E. coli and Enterobacterales.  No chest pain or dyspnea or cough or wheezing.  No nausea or vomiting or abdominal pain.  No rhinorrhea or nasal congestion or sore throat or earache.  ED Course: When she came to the ER BP was 113/43 with otherwise normal vital signs.  Labs revealed hypokalemia of 2.9 and hypoalbuminemia of 3.3 with otherwise unremarkable CMP.  Magnesium level is 1.6.  Lactic acid was 1.5.  CBC showed anemia with hemoglobin 9.5 and hematocrit 31 with microcytosis, comparable to previous levels. EKG as reviewed by me: EKG on 12/25 showed sinus tachycardia with rate 111 with PACs and LVH with T wave inversion laterally.  Imaging: Most recent portable chest x-ray on 12-25/24 showed no acute cardiopulmonary disease.  The patient was given 23 Michael potassium chloride and 10  mill equivalent IV KCl and ordered IV meropenem.  She will be admitted to a medical telemetry bed for further evaluation and management.  PAST MEDICAL HISTORY:   Past Medical History:  Diagnosis Date   Anemia    Anxiety    a.) on BZO   Aortic atherosclerosis (HCC)    Arthritis    hands, knees, toes   Bone island 10/15/2016   a.) RIGHT humeral head   CAD (coronary artery disease)    a.) 2010 --> PCI with stent placement x 1 (type location unknown)   Cataract    Depression    GERD (gastroesophageal reflux disease)    Hyperlipidemia    Hypertension    IBS (irritable bowel syndrome)    Insomnia    a.) on zolpidem   Nodule of right lung 10/18/2016   Psoriatic arthritis (HCC)    Takotsubo cardiomyopathy    Thalassemia    Vertigo    none - 10 yrs   Vitamin D deficiency 11/15/2022   Wears dentures    partial lower    PAST SURGICAL HISTORY:   Past Surgical History:  Procedure Laterality Date   APPENDECTOMY  08/12/1965   CARPAL TUNNEL RELEASE  08/12/2005   CATARACT EXTRACTION  08/13/2011   CHOLECYSTECTOMY  08/12/1998   COLONOSCOPY  03/11/2014   Dr.Wohl   COLONOSCOPY WITH PROPOFOL N/A 12/11/2015   Procedure: COLONOSCOPY WITH PROPOFOL;  Surgeon: Midge Minium, MD;  Location: [facility];  Service: Endoscopy;  Laterality:  N/A;  DESCENDING COLON POLYP    CORONARY ANGIOPLASTY WITH STENT PLACEMENT  08/12/2008   ESOPHAGOGASTRODUODENOSCOPY  02/21/2014   Dr.Wohl   ESOPHAGOGASTRODUODENOSCOPY (EGD) WITH PROPOFOL N/A 12/11/2015   Procedure: ESOPHAGOGASTRODUODENOSCOPY (EGD) WITH PROPOFOL;  Surgeon: Midge Minium, MD;  Location: [facility];  Service: Endoscopy;  Laterality: N/A;   INCISION AND DRAINAGE Right 12/17/2021   Procedure: INCISION AND DRAINAGE;  Surgeon: Juanell Fairly, MD;  Location: [facility];  Service: Orthopedics;  Laterality: Right;   KNEE ARTHROSCOPY Right 10/04/2021   Procedure: ARTHROSCOPY KNEE;  Surgeon: Juanell Fairly, MD;  Location: [facility];   Service: Orthopedics;  Laterality: Right;   KNEE ARTHROSCOPY Right 10/30/2021   Procedure: ARTHROSCOPY KNEE, I&D OF SUPERFICIAL ABSCESS;  Surgeon: Juanell Fairly, MD;  Location: [facility];  Service: Orthopedics;  Laterality: Right;   ORIF HUMERUS FRACTURE Left 11/14/2022   Procedure: OPEN REDUCTION INTERNAL FIXATION (ORIF) PROXIMAL HUMERUS FRACTURE;  Surgeon: Myrene Galas, MD;  Location: [facility];  Service: Orthopedics;  Laterality: Left;   POLYPECTOMY N/A 12/11/2015   Procedure: POLYPECTOMY;  Surgeon: Midge Minium, MD;  Location: [facility];  Service: Endoscopy;  Laterality: N/A;   TONSILLECTOMY     TONSILLECTOMY AND ADENOIDECTOMY  08/13/1967    SOCIAL HISTORY:   Social History   Tobacco Use   Smoking status: Former    Current packs/day: 0.00    Average packs/day: 0.5 packs/day for 1 year (0.5 ttl pk-yrs)    Types: Cigarettes    Start date: 39    Quit date: 27    Years since quitting: 53.0   Smokeless tobacco: Never   Tobacco comments:    smoked occasionally  Substance Use Topics   Alcohol use: Yes    Alcohol/week: 1.0 standard drink of alcohol    Types: 1 Shots of liquor per week    Comment: socially    FAMILY HISTORY:   Family History  Problem Relation Age of Onset   Cirrhosis Mother    Hepatitis Mother    Thalassemia Mother    Diabetes Mother    Heart disease Father    Diabetes Father    Hyperlipidemia Father    Hypertension Father    Stroke Father    Cancer Sister        lung   Diabetes Sister    Stroke Sister        during a surgery   Diabetes Brother    Heart disease Brother    Hypertension Brother    Stroke Brother    Thalassemia Maternal Grandmother    COPD Neg Hx     DRUG ALLERGIES:   Allergies  Allergen Reactions   Rosuvastatin     Muscle Pain    REVIEW OF SYSTEMS:   ROS As per history of present illness. All pertinent systems were reviewed above. Constitutional, HEENT, cardiovascular, respiratory, GI, GU, musculoskeletal,  neuro, psychiatric, endocrine, integumentary and hematologic systems were reviewed and are otherwise negative/unremarkable except for positive findings mentioned above in the HPI.   MEDICATIONS AT HOME:   Prior to Admission medications   Medication Sig Start Date End Date Taking? Authorizing Provider  acetaminophen (TYLENOL) 500 MG tablet Take 1 tablet (500 mg total) by mouth every 12 (twelve) hours. 11/15/22   Montez Morita, PA-C  alendronate (FOSAMAX) 70 MG tablet Take by mouth. 01/30/23 01/30/24  [provider]  ALPRAZolam Prudy Feeler) 0.25 MG tablet Take 0.25 mg by mouth 2 (two) times daily as needed for anxiety.    [provider]  Ascorbic Acid (VITAMIN C) 1000 MG tablet Take 1 tablet (1,000 mg total) by mouth daily. 11/15/22   Montez Morita, PA-C  atorvastatin (LIPITOR) 20 MG tablet Take 20 mg by mouth daily. 09/05/21   [provider]  busPIRone (BUSPAR) 5 MG tablet Take 5 mg by mouth 2 (two) times daily. 01/25/20   [provider]  carvedilol (COREG) 3.125 MG tablet Take 3.125 mg by mouth 2 (two) times daily with a meal. 03/15/16   [provider]  cephALEXin (KEFLEX) 500 MG capsule Take 1 capsule (500 mg total) by mouth 3 (three) times daily for 5 days. 08/07/23 08/12/23  Delton Prairie, MD  Cholecalciferol 125 MCG (5000 UT) TABS Take 1 tablet (5,000 Units total) by mouth daily. 11/15/22   Montez Morita, PA-C  diclofenac Sodium (VOLTAREN) 1 % GEL Apply 1 application topically 2 (two) times daily as needed (pain).    [provider]  docusate sodium (COLACE) 100 MG capsule Take 1 capsule (100 mg total) by mouth 2 (two) times daily. 11/15/22   Montez Morita, PA-C  escitalopram (LEXAPRO) 20 MG tablet TAKE 1 TABLET BY MOUTH EVERY DAY 11/10/19   Danelle Berry, PA-C  ipratropium (ATROVENT) 0.06 % nasal spray Place 2 sprays into both nostrils 4 (four) times daily. 02/09/23   Becky Augusta, NP  losartan (COZAAR) 25 MG tablet Take 25 mg by mouth 2 (two) times a day.   08/03/18   [provider]  omeprazole (PRILOSEC) 20 MG capsule Take 20 mg by mouth daily.    [provider]  ondansetron (ZOFRAN-ODT) 4 MG disintegrating tablet Take 1 tablet (4 mg total) by mouth every 8 (eight) hours as needed. 08/07/23   Delton Prairie, MD  polyethylene glycol (MIRALAX / GLYCOLAX) 17 g packet Take 17 g by mouth daily. Mix one tablespoon with 8oz of your favorite juice or water every day until you are having soft formed stools. Then start taking once daily if you didn't have a stool the day before. 11/06/22   Willy Eddy, MD  zolpidem (AMBIEN) 10 MG tablet Take 5 mg by mouth at bedtime as needed for sleep. 05/19/15   [provider]      VITAL SIGNS:  Blood pressure 105/79, pulse 80, temperature 99.3 F (37.4 C), temperature source Oral, resp. rate 18, weight 65.8 kg, SpO2 94%.  PHYSICAL EXAMINATION:  Physical Exam  GENERAL:  72 y.o.-year-old Caucasian female patient lying in the bed with no acute distress.  EYES: Pupils equal, round, reactive to light and accommodation. No scleral icterus. Extraocular muscles intact.  HEENT: Head atraumatic, normocephalic. Oropharynx and nasopharynx clear.  NECK:  Supple, no jugular venous distention. No thyroid enlargement, no tenderness.  LUNGS: Normal breath sounds bilaterally, no wheezing, rales,rhonchi or crepitation. No use of accessory muscles of respiration.  CARDIOVASCULAR: Regular rate and rhythm, S1, S2 normal. No murmurs, rubs, or gallops.  ABDOMEN: Soft, nondistended, nontender. Bowel sounds present. No organomegaly or mass.  EXTREMITIES: No pedal edema, cyanosis, or clubbing.  NEUROLOGIC: Cranial nerves II through XII are intact. Muscle strength 5/5 in all extremities. Sensation intact. Gait not checked.  PSYCHIATRIC: The patient is alert and oriented x 3.  Normal affect and good eye contact. SKIN: No obvious rash, lesion, or ulcer.   LABORATORY PANEL:   CBC Recent Labs  Lab  08/08/23 1337  WBC 7.1  HGB 9.5*  HCT 31.0*  PLT 168   ------------------------------------------------------------------------------------------------------------------  Chemistries  Recent Labs  Lab 08/08/23 1336 08/08/23 1337  NA  --  137  K  --  2.9*  CL  --  105  CO2  --  24  GLUCOSE  --  116*  BUN  --  15  CREATININE  --  0.94  CALCIUM  --  8.3*  MG 1.6*  --   AST  --  32  ALT  --  29  ALKPHOS  --  69  BILITOT  --  1.1   ------------------------------------------------------------------------------------------------------------------  Cardiac Enzymes No results for input(s): "TROPONINI" in the last 168 hours. ------------------------------------------------------------------------------------------------------------------  RADIOLOGY:  No results found.     IMPRESSION AND PLAN:  Assessment and Plan: * E coli bacteremia - This is a ESBL E. coli bacteremia. - The patient will be admitted to a medical telemetry bed. - We will continue antibiotic therapy with IV meropenem to be adjusted per pharmacy. - We will continue hydration with IV normal saline with added potassium chloride. - We will follow repeat urine culture. - Patient will benefit from ID consult that can be done in a.m. that is currently with phone consultation with Dr. Elinor Parkinson.  Hypokalemia - Potassium will be replaced and followed.  Hypomagnesemia - Magnesium will be replaced  Essential hypertension We will continue antihypertensive therapy.  GERD without esophagitis - We will continue PPI therapy.  Dyslipidemia - We will continue statin therapy.  Anxiety and depression - We will continue BuSpar, Xanax and Zyprexa.     DVT prophylaxis: Lovenox.  Advanced Care Planning:  Code Status: The patient is DNR only. Family Communication:  The plan of care was discussed in details with the patient (and family). I answered all questions. The patient agreed to proceed with the above  mentioned plan. Further management will depend upon hospital course. Disposition Plan: Back to previous home environment Consults called: none.  All the records are reviewed and case discussed with ED provider.  Status is: Inpatient  At the time of the admission, it appears that the appropriate admission status for this patient is inpatient.  This is judged to be reasonable and necessary in order to provide the required intensity of service to ensure the patient's safety given the presenting symptoms, physical exam findings and initial radiographic and laboratory data in the context of comorbid conditions.  The patient requires inpatient status due to high intensity of service, high risk of further deterioration and high frequency of surveillance required.  I certify that at the time of admission, it is my clinical judgment that the patient will require inpatient hospital care extending more than 2 midnights.                            Dispo: The patient is from: Home              Anticipated d/c is to: Home              Patient currently is not medically stable to d/c.              Difficult to place patient: No  Hannah Beat M.D on 08/08/2023 at 9:51 PM  [facility]   From 7 PM-7 AM, contact night-coverage [email or web address]  CC: Primary care physician; Mick Sell, MD

## 2023-08-08 NOTE — Assessment & Plan Note (Addendum)
-   This is a ESBL E. coli bacteremia. - The patient will be admitted to a medical telemetry bed. - We will continue antibiotic therapy with IV meropenem to be adjusted per pharmacy. - We will continue hydration with IV normal saline with added potassium chloride. - We will follow repeat urine culture. - Patient will benefit from ID consult that can be done in a.m. that is currently with phone consultation with Dr. Elinor Parkinson.

## 2023-08-08 NOTE — Progress Notes (Signed)
Pharmacy Antibiotic Note  Lydia Elliott is a 72 y.o. female admitted on 08/08/2023 with bacteremia. PMH significant for CAD, HTN, and anemia. Patient presented to ED on 12/25 for malaise and fevers, found to have UTI, but was stable and discharged home on Keflex. Blood cultures from 12/25 returned positive for >100K colonies of ESBL Ecoli. Patient instructed to return to ED to receive IV antibiotic therapy. In ED, patient is afebrile with WBC 7.1. Pharmacy has been consulted for meropenem dosing.  Plan: Start meropenem 1 g IV every 12 hours based on current renal function Monitor renal function, clinical status, culture data, and LOT  Weight: 65.8 kg (144 lb 15.9 oz)  Temp (24hrs), Avg:99 F (37.2 C), Min:98.6 F (37 C), Max:99.3 F (37.4 C)  Recent Labs  Lab 08/06/23 2051 08/06/23 2346 08/08/23 1337  WBC 10.7*  --  7.1  CREATININE 1.01*  --  0.94  LATICACIDVEN 2.1* 1.2 1.5    Estimated Creatinine Clearance: 47.5 mL/min (by C-G formula based on SCr of 0.94 mg/dL).    Allergies  Allergen Reactions   Rosuvastatin     Muscle Pain   Antimicrobials this admission: meropenem 12/27 >>   Dose adjustments this admission: N/A  Microbiology results: 12/25 BCx: >100K ESBL Ecoli 12/27 Bcx: pending  Thank you for involving pharmacy in this patient's care.   Rockwell Alexandria, PharmD Clinical Pharmacist 08/08/2023 7:43 PM

## 2023-08-08 NOTE — Assessment & Plan Note (Signed)
-   We will continue PPI therapy 

## 2023-08-08 NOTE — Assessment & Plan Note (Addendum)
-   Potassium will be replaced and followed. 

## 2023-08-08 NOTE — Assessment & Plan Note (Addendum)
-   We will continue anti-hypertensive therapy. 

## 2023-08-08 NOTE — ED Triage Notes (Signed)
Says she was called back because they found ecoli in blood.  Had uti.

## 2023-08-09 ENCOUNTER — Encounter: Payer: Self-pay | Admitting: Family Medicine

## 2023-08-09 DIAGNOSIS — N390 Urinary tract infection, site not specified: Secondary | ICD-10-CM | POA: Insufficient documentation

## 2023-08-09 DIAGNOSIS — R7881 Bacteremia: Secondary | ICD-10-CM | POA: Diagnosis not present

## 2023-08-09 DIAGNOSIS — E876 Hypokalemia: Secondary | ICD-10-CM | POA: Diagnosis not present

## 2023-08-09 DIAGNOSIS — Z1612 Extended spectrum beta lactamase (ESBL) resistance: Secondary | ICD-10-CM

## 2023-08-09 DIAGNOSIS — B9620 Unspecified Escherichia coli [E. coli] as the cause of diseases classified elsewhere: Secondary | ICD-10-CM | POA: Diagnosis not present

## 2023-08-09 DIAGNOSIS — B9629 Other Escherichia coli [E. coli] as the cause of diseases classified elsewhere: Secondary | ICD-10-CM | POA: Insufficient documentation

## 2023-08-09 LAB — CULTURE, BLOOD (ROUTINE X 2)
Special Requests: ADEQUATE
Special Requests: ADEQUATE

## 2023-08-09 LAB — BASIC METABOLIC PANEL
Anion gap: 6 (ref 5–15)
BUN: 10 mg/dL (ref 8–23)
CO2: 24 mmol/L (ref 22–32)
Calcium: 7.7 mg/dL — ABNORMAL LOW (ref 8.9–10.3)
Chloride: 108 mmol/L (ref 98–111)
Creatinine, Ser: 0.78 mg/dL (ref 0.44–1.00)
GFR, Estimated: >60 mL/min (ref >60)
Glucose, Bld: 100 mg/dL — ABNORMAL HIGH (ref 70–99)
Potassium: 3.6 mmol/L (ref 3.5–5.1)
Sodium: 138 mmol/L (ref 135–145)

## 2023-08-09 LAB — CBC
HCT: 29 % — ABNORMAL LOW (ref 36.0–46.0)
Hemoglobin: 8.6 g/dL — ABNORMAL LOW (ref 12.0–15.0)
MCH: 20.6 pg — ABNORMAL LOW (ref 26.0–34.0)
MCHC: 29.7 g/dL — ABNORMAL LOW (ref 30.0–36.0)
MCV: 69.4 fL — ABNORMAL LOW (ref 80.0–100.0)
Platelets: 162 10*3/uL (ref 150–400)
RBC: 4.18 MIL/uL (ref 3.87–5.11)
RDW: 14.6 % (ref 11.5–15.5)
WBC: 5.7 10*3/uL (ref 4.0–10.5)
nRBC: 0 % (ref 0.0–0.2)

## 2023-08-09 LAB — URINE CULTURE: Culture: >=100000 — AB

## 2023-08-09 LAB — VITAMIN B12: Vitamin B-12: 1542 pg/mL — ABNORMAL HIGH (ref 180–914)

## 2023-08-09 MED ORDER — SODIUM CHLORIDE 0.9 % IV SOLN
1.0000 g | Freq: Three times a day (TID) | INTRAVENOUS | Status: DC
  Administered 2023-08-09 – 2023-08-10 (×3): 1 g via INTRAVENOUS
  Filled 2023-08-09 (×4): qty 20

## 2023-08-09 NOTE — Plan of Care (Signed)

## 2023-08-09 NOTE — Progress Notes (Signed)
  Progress Note   Patient: Lydia Elliott NWG:[id number] DOB: [birth year] DOA: 08/08/2023     1 DOS: the patient was seen and examined on 08/09/2023   Brief hospital course: Lydia Elliott is a 72 y.o. Caucasian female with medical history significant for anxiety, depression, CAD, GERD, hypertension, dyslipidemia, osteoarthritis, thalassemia and psoriatic arthritis as well as IBS, who presented to the emergency room with acute onset of feeling sick for the last few days with associated intermittent fever with a Tmax of 102. She was seen in ER on Christmas evening and diagnosed with UTI. She was prescribed p.o. Keflex which she has been taking. She continues to have intermittent fevers. She was called to come to the ER as she had 2 of 2 blood cultures growing ESBL E. Coli.  Patient was started on meropenem.    Principal Problem:   E coli bacteremia Active Problems:   Hypokalemia   Hypomagnesemia   Essential hypertension   Anxiety and depression   Dyslipidemia   GERD without esophagitis   Assessment and Plan: * E coli bacteremia Urinary tract infection secondary to E. coli ESBL. Patient did not meet sepsis criteria at time of admission, no longer has any fever.  Initial blood culture and urine culture both positive for E. coli ESBL. Patient currently treated with meropenem. Discussed with ID, Dr.Manandhar, patient can be treated with Cipro for total course of 10 days. I will continue meropenem for today, pending repeat culture results, if clinically stable, she will be discharged home tomorrow with oral Cipro.  Hypokalemia Hypomagnesemia Repleted, recheck levels tomorrow.  Anemia. Recent iron study was normal, check a B12 level.  Essential hypertension Blood pressure controlled with losartan.  GERD without esophagitis - We will continue PPI therapy.  Dyslipidemia - We will continue statin therapy.  Anxiety and depression - We will continue BuSpar, Xanax and  Zyprexa.       Subjective:  Patient no additional fever since admission, no dysuria or hematuria.  Physical Exam: Vitals:   08/09/23 0712 08/09/23 0800 08/09/23 0900 08/09/23 0933  BP: (!) 157/59  (!) 136/42   Pulse:  82 78   Resp:  (!) 26 17   Temp:    98.9 F (37.2 C)  TempSrc:    Oral  SpO2:  96% 96%   Weight:       General exam: Appears calm and comfortable  Respiratory system: Clear to auscultation. Respiratory effort normal. Cardiovascular system: S1 & S2 heard, RRR. No JVD, murmurs, rubs, gallops or clicks. No pedal edema. Gastrointestinal system: Abdomen is nondistended, soft and nontender. No organomegaly or masses felt. Normal bowel sounds heard. Central nervous system: Alert and oriented. No focal neurological deficits. Extremities: Symmetric 5 x 5 power. Skin: No rashes, lesions or ulcers Psychiatry: Judgement and insight appear normal. Mood & affect appropriate.    Data Reviewed:  Lab results reviewed.  Family Communication: None  Disposition: Status is: Inpatient Remains inpatient appropriate because: Severity of disease, IV treatment.     Time spent: 35 minutes  Author: Marrion Coy, MD 08/09/2023 11:08 AM  For on call review [email or web address].

## 2023-08-09 NOTE — Hospital Course (Signed)
Lydia Elliott is a 72 y.o. Caucasian female with medical history significant for anxiety, depression, CAD, GERD, hypertension, dyslipidemia, osteoarthritis, thalassemia and psoriatic arthritis as well as IBS, who presented to the emergency room with acute onset of feeling sick for the last few days with associated intermittent fever with a Tmax of 102. She was seen in ER on Christmas evening and diagnosed with UTI. She was prescribed p.o. Keflex which she has been taking. She continues to have intermittent fevers. She was called to come to the ER as she had 2 of 2 blood cultures growing ESBL E. Coli.  Patient was started on meropenem.

## 2023-08-09 NOTE — Progress Notes (Signed)
Pharmacy Antibiotic Note  Lydia Elliott is a 72 y.o. female admitted on 08/08/2023 with bacteremia. PMH significant for CAD, HTN, and anemia. Patient presented to ED on 12/25 for malaise and fevers, found to have UTI, but was stable and discharged home on Keflex. Blood cultures from 12/25 returned positive for >100K colonies of ESBL Ecoli. Patient instructed to return to ED to receive IV antibiotic therapy. In ED, patient is afebrile with WBC 7.1. Pharmacy has been consulted for meropenem dosing.  Plan: Scr improved 0.94>>0.78 adjust meropenem from 1 g IV Q12h to q8h for Crcl 55.8 ml/min 12/27 Bcx: NG<12 hrs  Monitor renal function, clinical status, culture data, and LOT   Weight: 65.8 kg (144 lb 15.9 oz)  Temp (24hrs), Avg:99.2 F (37.3 C), Min:98.6 F (37 C), Max:100 F (37.8 C)  Recent Labs  Lab 08/06/23 2051 08/06/23 2346 08/08/23 1337 08/09/23 0351  WBC 10.7*  --  7.1 5.7  CREATININE 1.01*  --  0.94 0.78  LATICACIDVEN 2.1* 1.2 1.5  --     Estimated Creatinine Clearance: 55.8 mL/min (by C-G formula based on SCr of 0.78 mg/dL).    Allergies  Allergen Reactions   Rosuvastatin     Muscle Pain   Antimicrobials this admission: meropenem 12/27 >>   Dose adjustments this admission:   Microbiology results: 12/25 BCx: >100K ESBL Ecoli 12/27 Bcx: NG<12 hrs  Thank you for involving pharmacy in this patient's care.   Bari Mantis PharmD Clinical Pharmacist 08/09/2023

## 2023-08-10 DIAGNOSIS — E876 Hypokalemia: Secondary | ICD-10-CM | POA: Diagnosis not present

## 2023-08-10 DIAGNOSIS — R7881 Bacteremia: Secondary | ICD-10-CM | POA: Diagnosis not present

## 2023-08-10 DIAGNOSIS — N390 Urinary tract infection, site not specified: Secondary | ICD-10-CM | POA: Diagnosis not present

## 2023-08-10 LAB — BASIC METABOLIC PANEL
Anion gap: 9 (ref 5–15)
BUN: 8 mg/dL (ref 8–23)
CO2: 24 mmol/L (ref 22–32)
Calcium: 8.1 mg/dL — ABNORMAL LOW (ref 8.9–10.3)
Chloride: 106 mmol/L (ref 98–111)
Creatinine, Ser: 0.7 mg/dL (ref 0.44–1.00)
GFR, Estimated: >60 mL/min (ref >60)
Glucose, Bld: 98 mg/dL (ref 70–99)
Potassium: 3.6 mmol/L (ref 3.5–5.1)
Sodium: 139 mmol/L (ref 135–145)

## 2023-08-10 LAB — CBC
HCT: 26.8 % — ABNORMAL LOW (ref 36.0–46.0)
Hemoglobin: 8.3 g/dL — ABNORMAL LOW (ref 12.0–15.0)
MCH: 20.3 pg — ABNORMAL LOW (ref 26.0–34.0)
MCHC: 31 g/dL (ref 30.0–36.0)
MCV: 65.7 fL — ABNORMAL LOW (ref 80.0–100.0)
Platelets: 170 10*3/uL (ref 150–400)
RBC: 4.08 MIL/uL (ref 3.87–5.11)
RDW: 14.6 % (ref 11.5–15.5)
WBC: 5.2 10*3/uL (ref 4.0–10.5)
nRBC: 0 % (ref 0.0–0.2)

## 2023-08-10 LAB — URINE CULTURE: Culture: NO GROWTH

## 2023-08-10 LAB — MAGNESIUM: Magnesium: 1.9 mg/dL (ref 1.7–2.4)

## 2023-08-10 MED ORDER — CIPROFLOXACIN HCL 500 MG PO TABS: 500.0000 mg | ORAL_TABLET | Freq: Two times a day (BID) | ORAL | 0 refills | Status: AC

## 2023-08-10 NOTE — Discharge Summary (Signed)
Physician Discharge Summary   Patient: Lydia Elliott MRN: [id number] DOB: [birth year]  Admit date:     08/08/2023  Discharge date: 08/10/23  Discharge Physician: Marrion Coy   PCP: Mick Sell, MD   Recommendations at discharge:   Follow-up with PCP in 1 week.  Discharge Diagnoses: Principal Problem:   E coli bacteremia Active Problems:   Hypokalemia   Hypomagnesemia   Essential hypertension   Anxiety and depression   Dyslipidemia   GERD without esophagitis   UTI due to extended-spectrum beta lactamase (ESBL) producing Escherichia coli  Resolved Problems:   * No resolved hospital problems. *  Hospital Course: Devanee Elliott is a 72 y.o. Caucasian female with medical history significant for anxiety, depression, CAD, GERD, hypertension, dyslipidemia, osteoarthritis, thalassemia and psoriatic arthritis as well as IBS, who presented to the emergency room with acute onset of feeling sick for the last few days with associated intermittent fever with a Tmax of 102. She was seen in ER on Christmas evening and diagnosed with UTI. She was prescribed p.o. Keflex which she has been taking. She continues to have intermittent fevers. She was called to come to the ER as she had 2 of 2 blood cultures growing ESBL E. Coli.  Patient was started on meropenem. Repeated blood culture came back negative in 48 hours, medically stable for discharge. Per discussion with ID, patient will be continued with oral Cipro for total antibiotic course of 10 days.   Assessment and Plan: * E coli bacteremia Urinary tract infection secondary to E. coli ESBL. Patient did not meet sepsis criteria at time of admission, no longer has any fever.  Initial blood culture and urine culture both positive for E. coli ESBL. Patient currently treated with meropenem. Discussed with ID, Dr.Manandhar, patient can be treated with Cipro for total course of 10 days. Patient was treated with meropenem until today, clinically  stable for discharge with Cipro.   Hypokalemia Hypomagnesemia Improved.   Anemia. Recent iron study was normal, B12 elevated.  Hb stable.   Essential hypertension Blood pressure controlled with losartan.   GERD without esophagitis - We will continue PPI therapy.   Dyslipidemia - We will continue statin therapy.   Anxiety and depression - We will continue BuSpar, Xanax and Zyprexa.         Consultants: None Procedures performed: None  Disposition: Home Diet recommendation:  Discharge Diet Orders (From admission, onward)     Start     Ordered   08/10/23 0000  Diet - low sodium heart healthy        08/10/23 0950           Cardiac diet DISCHARGE MEDICATION: Allergies as of 08/10/2023       Reactions   Rosuvastatin    Muscle Pain        Medication List     STOP taking these medications    ALPRAZolam 0.25 MG tablet Commonly known as: XANAX   cephALEXin 500 MG capsule Commonly known as: KEFLEX   docusate sodium 100 MG capsule Commonly known as: COLACE   ipratropium 0.06 % nasal spray Commonly known as: ATROVENT   polyethylene glycol 17 g packet Commonly known as: MIRALAX / GLYCOLAX       TAKE these medications    Acetaminophen Extra Strength 500 MG Tabs Take 1 tablet (500 mg total) by mouth every 12 (twelve) hours. What changed:  when to take this reasons to take this   alendronate 70 MG tablet Commonly known as:  FOSAMAX Take 70 mg by mouth once a week. Sunday   atorvastatin 20 MG tablet Commonly known as: LIPITOR Take 20 mg by mouth daily.   busPIRone 5 MG tablet Commonly known as: BUSPAR Take 5 mg by mouth daily.   carvedilol 3.125 MG tablet Commonly known as: COREG Take 3.125 mg by mouth 2 (two) times daily with a meal.   ciprofloxacin 500 MG tablet Commonly known as: Cipro Take 1 tablet (500 mg total) by mouth 2 (two) times daily for 8 days.   diclofenac Sodium 1 % Gel Commonly known as: VOLTAREN Apply 1 application  topically 2 (two) times daily as needed (pain).   escitalopram 20 MG tablet Commonly known as: LEXAPRO TAKE 1 TABLET BY MOUTH EVERY DAY What changed: when to take this   losartan 25 MG tablet Commonly known as: COZAAR Take 25 mg by mouth 2 (two) times a day.   omeprazole 20 MG capsule Commonly known as: PRILOSEC Take 20 mg by mouth daily.   ondansetron 4 MG disintegrating tablet Commonly known as: ZOFRAN-ODT Take 1 tablet (4 mg total) by mouth every 8 (eight) hours as needed.   vitamin C 1000 MG tablet Take 1 tablet (1,000 mg total) by mouth daily.   Vitamin D3 125 MCG (5000 UT) Tabs Take 1 tablet (5,000 Units total) by mouth daily.   zolpidem 10 MG tablet Commonly known as: AMBIEN Take 5 mg by mouth at bedtime as needed for sleep.        Follow-up Information     Mick Sell, MD Follow up in 1 week(s).   Specialty: Infectious Diseases Contact information: [address] [place] [address] [phone]                Discharge Exam: Ceasar Mons Weights   08/08/23 1332 08/09/23 2111  Weight: 65.8 kg 70.8 kg   General exam: Appears calm and comfortable  Respiratory system: Clear to auscultation. Respiratory effort normal. Cardiovascular system: S1 & S2 heard, RRR. No JVD, murmurs, rubs, gallops or clicks. No pedal edema. Gastrointestinal system: Abdomen is nondistended, soft and nontender. No organomegaly or masses felt. Normal bowel sounds heard. Central nervous system: Alert and oriented. No focal neurological deficits. Extremities: Symmetric 5 x 5 power. Skin: No rashes, lesions or ulcers Psychiatry: Judgement and insight appear normal. Mood & affect appropriate.    Condition at discharge: good  The results of significant diagnostics from this hospitalization (including imaging, microbiology, ancillary and laboratory) are listed below for reference.   Imaging Studies: DG Chest Port 1 View Result Date: 08/06/2023 CLINICAL DATA:  Fever  with nausea and vomiting. EXAM: PORTABLE CHEST 1 VIEW COMPARISON:  None Available. FINDINGS: The heart size and mediastinal contours are within normal limits. There is marked severity calcification of the aortic arch. There is no evidence of an acute infiltrate, pleural effusion or pneumothorax. Radiopaque surgical clips are seen within the right upper quadrant. Radiopaque fixation hardware is present within the proximal and mid left humerus. No acute osseous abnormalities are identified. IMPRESSION: No active cardiopulmonary disease. Electronically Signed   By: Aram Candela M.D.   On: 08/06/2023 21:49   MR LUMBAR SPINE WO CONTRAST Result Date: 07/30/2023 CLINICAL DATA:  4-6 week history of back pain EXAM: MRI LUMBAR SPINE WITHOUT CONTRAST TECHNIQUE: Multiplanar, multisequence MR imaging of the lumbar spine was performed. No intravenous contrast was administered. COMPARISON:  None Available. FINDINGS: Segmentation:  Standard. Alignment:  Grade 1 anterolisthesis of L4 on L5 Vertebrae: No fracture, evidence  of discitis, or bone lesion. Degenerative endplate changes of the inferior and superior endplates of L2 on L3 Conus medullaris and cauda equina: Conus extends to the L1-L2 disc space level. Conus and cauda equina appear normal. Paraspinal and other soft tissues: Negative. Disc levels: T12-L1: Small central disc protrusion. Mild bilateral facet degenerative change. No spinal canal narrowing. No neural foraminal narrowing. L1-L2: Left paracentral disc extrusion with cranial migration of the extruded disc material. Mild-to-moderate spinal canal narrowing. Mild bilateral neural foraminal narrowing. Mild bilateral facet degenerative change. There is narrowing of the left lateral recess L2-L3: Moderate disc space loss. Eccentric left disc bulge. Mild bilateral facet degenerative change. Mild-to-moderate spinal canal narrowing. Moderate left and moderate to severe right neural foraminal narrowing. L3-L4: Mild  bilateral facet degenerative change. Circumferential disc bulge. Mild spinal canal narrowing. Moderate bilateral neural foraminal narrowing. L4-L5: Moderate bilateral facet degenerative change, left-greater-than-right. Circumferential disc bulge. The extraforaminal component of the circumferential disc bulge may contact the exited right L4 nerve root (series 113, image 30). Ligamentum flavum hypertrophy. Mild overall spinal canal narrowing. Moderate bilateral neural foraminal narrowing. There is narrowing of the left lateral recess. L5-S1: Severe left and mild right facet degenerative change. Circumferential disc bulge. The foraminal to extraforaminal component of the disc bulge may contact the exited left L5 nerve root (series 113, image 36). Mild overall spinal canal narrowing. Severe narrowing of the left lateral recess. Moderate to severe left neural foraminal narrowing. IMPRESSION: 1. Multilevel degenerative changes, most notably at L5-S1 where there is an eccentric left disc bulge that severely narrows the left lateral recess with mass effect on the descending left S1 nerve root. This disc bulge likely also contacts the exited left L5 nerve root in the foraminal/extraforaminal zone. 2. Eccentric left disc bulge at L4-L5 likely contacts the exited left L4 nerve root in the extraforaminal zone. 3. Left parcentral disc extrusion at L1-L2 with narrowing of the left lateral recess. 4. Additional degenerative changes, as above. Electronically Signed   By: Lorenza Cambridge M.D.   On: 07/30/2023 08:28    Microbiology: Results for orders placed or performed during the hospital encounter of 08/08/23  Blood Culture (routine x 2)     Status: None (Preliminary result)   Collection Time: 08/08/23  7:43 PM   Specimen: BLOOD RIGHT ARM  Result Value Ref Range Status   Specimen Description BLOOD RIGHT ARM  Final   Special Requests   Final    BOTTLES DRAWN AEROBIC AND ANAEROBIC Blood Culture adequate volume   Culture    Final    NO GROWTH 2 DAYS Performed at [facility], [address] [place], [place] [address]    Report Status PENDING  Incomplete  Blood Culture (routine x 2)     Status: None (Preliminary result)   Collection Time: 08/08/23  7:44 PM   Specimen: BLOOD LEFT ARM  Result Value Ref Range Status   Specimen Description BLOOD LEFT ARM  Final   Special Requests   Final    BOTTLES DRAWN AEROBIC AND ANAEROBIC Blood Culture results may not be optimal due to an inadequate volume of blood received in culture bottles   Culture   Final    NO GROWTH 2 DAYS Performed at [facility], [address], [place], [place] [address]    Report Status PENDING  Incomplete    Labs: CBC: Recent Labs  Lab 08/06/23 2051 08/08/23 1337 08/09/23 0351 08/10/23 0606  WBC 10.7* 7.1 5.7 5.2  NEUTROABS 10.3* 5.7  --   --  HGB 9.6* 9.5* 8.6* 8.3*  HCT 30.7* 31.0* 29.0* 26.8*  MCV 66.5* 67.1* 69.4* 65.7*  PLT 172 168 162 170   Basic Metabolic Panel: Recent Labs  Lab 08/06/23 2051 08/08/23 1336 08/08/23 1337 08/09/23 0351 08/10/23 0606  NA 132*  --  137 138 139  K 2.9*  --  2.9* 3.6 3.6  CL 101  --  105 108 106  CO2 21*  --  24 24 24   GLUCOSE 158*  --  116* 100* 98  BUN 21  --  15 10 8   CREATININE 1.01*  --  0.94 0.78 0.70  CALCIUM 8.2*  --  8.3* 7.7* 8.1*  MG  --  1.6*  --   --  1.9   Liver Function Tests: Recent Labs  Lab 08/06/23 2051 08/08/23 1337  AST 47* 32  ALT 33 29  ALKPHOS 74 69  BILITOT 3.2* 1.1  PROT 6.8 6.5  ALBUMIN 3.5 3.3*   CBG: No results for input(s): "GLUCAP" in the last 168 hours.  Discharge time spent: greater than 30 minutes.  Signed: Marrion Coy, MD [facility] 08/10/2023

## 2023-08-10 NOTE — TOC CM/SW Note (Signed)
Transition of Care [facility]) - Inpatient Brief Assessment   Patient Details  Name: Lydia Elliott MRN: [id number] Date of Birth: 01/21/1951  [facility] CM/SW Contact:    Rodney Langton, RN Phone Number: 08/10/2023, 10:36 AM   Clinical Narrative:  Brief assessment done, patient has discharge orders.  No TOC needs identified.    Transition of Care Asessment: Insurance and Status: Insurance coverage has been reviewed Patient has primary care physician: Yes Home environment has been reviewed: Yes Prior level of function:: Independent Prior/Current Home Services: No current home services Social Drivers of Health Review: SDOH reviewed no interventions necessary Readmission risk has been reviewed: Yes Transition of care needs: no transition of care needs at this time

## 2023-08-13 LAB — CULTURE, BLOOD (ROUTINE X 2)
Culture: NO GROWTH
Culture: NO GROWTH
Special Requests: ADEQUATE

## 2023-08-20 DIAGNOSIS — D649 Anemia, unspecified: Secondary | ICD-10-CM | POA: Diagnosis not present

## 2023-08-20 DIAGNOSIS — E782 Mixed hyperlipidemia: Secondary | ICD-10-CM | POA: Diagnosis not present

## 2023-08-20 DIAGNOSIS — Z1612 Extended spectrum beta lactamase (ESBL) resistance: Secondary | ICD-10-CM | POA: Diagnosis not present

## 2023-08-20 DIAGNOSIS — B9620 Unspecified Escherichia coli [E. coli] as the cause of diseases classified elsewhere: Secondary | ICD-10-CM | POA: Diagnosis not present

## 2023-08-20 DIAGNOSIS — I2510 Atherosclerotic heart disease of native coronary artery without angina pectoris: Secondary | ICD-10-CM | POA: Diagnosis not present

## 2023-08-20 DIAGNOSIS — L4050 Arthropathic psoriasis, unspecified: Secondary | ICD-10-CM | POA: Diagnosis not present

## 2023-08-20 DIAGNOSIS — R7881 Bacteremia: Secondary | ICD-10-CM | POA: Diagnosis not present

## 2023-08-20 DIAGNOSIS — E538 Deficiency of other specified B group vitamins: Secondary | ICD-10-CM | POA: Diagnosis not present

## 2023-08-20 DIAGNOSIS — M5126 Other intervertebral disc displacement, lumbar region: Secondary | ICD-10-CM | POA: Diagnosis not present

## 2023-08-20 DIAGNOSIS — M5416 Radiculopathy, lumbar region: Secondary | ICD-10-CM | POA: Diagnosis not present

## 2023-08-20 DIAGNOSIS — M48062 Spinal stenosis, lumbar region with neurogenic claudication: Secondary | ICD-10-CM | POA: Diagnosis not present

## 2023-08-20 DIAGNOSIS — A499 Bacterial infection, unspecified: Secondary | ICD-10-CM | POA: Diagnosis not present

## 2023-09-16 DIAGNOSIS — M4726 Other spondylosis with radiculopathy, lumbar region: Secondary | ICD-10-CM | POA: Diagnosis not present

## 2023-09-16 DIAGNOSIS — M5416 Radiculopathy, lumbar region: Secondary | ICD-10-CM | POA: Diagnosis not present

## 2023-09-16 DIAGNOSIS — M48062 Spinal stenosis, lumbar region with neurogenic claudication: Secondary | ICD-10-CM | POA: Diagnosis not present

## 2023-09-16 DIAGNOSIS — M48061 Spinal stenosis, lumbar region without neurogenic claudication: Secondary | ICD-10-CM | POA: Diagnosis not present

## 2023-09-16 DIAGNOSIS — M5126 Other intervertebral disc displacement, lumbar region: Secondary | ICD-10-CM | POA: Diagnosis not present

## 2023-09-26 DIAGNOSIS — M5459 Other low back pain: Secondary | ICD-10-CM | POA: Diagnosis not present

## 2023-09-26 DIAGNOSIS — R2689 Other abnormalities of gait and mobility: Secondary | ICD-10-CM | POA: Diagnosis not present

## 2023-10-01 DIAGNOSIS — M5459 Other low back pain: Secondary | ICD-10-CM | POA: Diagnosis not present

## 2023-10-01 DIAGNOSIS — R2689 Other abnormalities of gait and mobility: Secondary | ICD-10-CM | POA: Diagnosis not present

## 2023-10-06 DIAGNOSIS — M5459 Other low back pain: Secondary | ICD-10-CM | POA: Diagnosis not present

## 2023-10-06 DIAGNOSIS — R2689 Other abnormalities of gait and mobility: Secondary | ICD-10-CM | POA: Diagnosis not present

## 2023-10-08 DIAGNOSIS — M5459 Other low back pain: Secondary | ICD-10-CM | POA: Diagnosis not present

## 2023-10-08 DIAGNOSIS — R2689 Other abnormalities of gait and mobility: Secondary | ICD-10-CM | POA: Diagnosis not present

## 2023-10-14 DIAGNOSIS — R2689 Other abnormalities of gait and mobility: Secondary | ICD-10-CM | POA: Diagnosis not present

## 2023-10-14 DIAGNOSIS — M5459 Other low back pain: Secondary | ICD-10-CM | POA: Diagnosis not present

## 2023-10-15 DIAGNOSIS — I2510 Atherosclerotic heart disease of native coronary artery without angina pectoris: Secondary | ICD-10-CM | POA: Diagnosis not present

## 2023-10-15 DIAGNOSIS — E538 Deficiency of other specified B group vitamins: Secondary | ICD-10-CM | POA: Diagnosis not present

## 2023-10-15 DIAGNOSIS — A499 Bacterial infection, unspecified: Secondary | ICD-10-CM | POA: Diagnosis not present

## 2023-10-15 DIAGNOSIS — Z7960 Long term (current) use of unspecified immunomodulators and immunosuppressants: Secondary | ICD-10-CM | POA: Diagnosis not present

## 2023-10-15 DIAGNOSIS — R7881 Bacteremia: Secondary | ICD-10-CM | POA: Diagnosis not present

## 2023-10-15 DIAGNOSIS — M170 Bilateral primary osteoarthritis of knee: Secondary | ICD-10-CM | POA: Diagnosis not present

## 2023-10-15 DIAGNOSIS — Z1612 Extended spectrum beta lactamase (ESBL) resistance: Secondary | ICD-10-CM | POA: Diagnosis not present

## 2023-10-15 DIAGNOSIS — E782 Mixed hyperlipidemia: Secondary | ICD-10-CM | POA: Diagnosis not present

## 2023-10-15 DIAGNOSIS — D649 Anemia, unspecified: Secondary | ICD-10-CM | POA: Diagnosis not present

## 2023-10-15 DIAGNOSIS — B9620 Unspecified Escherichia coli [E. coli] as the cause of diseases classified elsewhere: Secondary | ICD-10-CM | POA: Diagnosis not present

## 2023-10-15 DIAGNOSIS — L409 Psoriasis, unspecified: Secondary | ICD-10-CM | POA: Diagnosis not present

## 2023-10-15 DIAGNOSIS — L4050 Arthropathic psoriasis, unspecified: Secondary | ICD-10-CM | POA: Diagnosis not present

## 2023-10-17 DIAGNOSIS — M5416 Radiculopathy, lumbar region: Secondary | ICD-10-CM | POA: Diagnosis not present

## 2023-10-17 DIAGNOSIS — M5126 Other intervertebral disc displacement, lumbar region: Secondary | ICD-10-CM | POA: Diagnosis not present

## 2023-10-17 DIAGNOSIS — M48062 Spinal stenosis, lumbar region with neurogenic claudication: Secondary | ICD-10-CM | POA: Diagnosis not present

## 2023-10-20 DIAGNOSIS — E782 Mixed hyperlipidemia: Secondary | ICD-10-CM | POA: Diagnosis not present

## 2023-10-20 DIAGNOSIS — Z0000 Encounter for general adult medical examination without abnormal findings: Secondary | ICD-10-CM | POA: Diagnosis not present

## 2023-10-20 DIAGNOSIS — I10 Essential (primary) hypertension: Secondary | ICD-10-CM | POA: Diagnosis not present

## 2023-10-20 DIAGNOSIS — L4050 Arthropathic psoriasis, unspecified: Secondary | ICD-10-CM | POA: Diagnosis not present

## 2023-10-20 DIAGNOSIS — I2510 Atherosclerotic heart disease of native coronary artery without angina pectoris: Secondary | ICD-10-CM | POA: Diagnosis not present

## 2023-10-20 DIAGNOSIS — E538 Deficiency of other specified B group vitamins: Secondary | ICD-10-CM | POA: Diagnosis not present

## 2023-10-20 DIAGNOSIS — B9620 Unspecified Escherichia coli [E. coli] as the cause of diseases classified elsewhere: Secondary | ICD-10-CM | POA: Diagnosis not present

## 2023-10-20 DIAGNOSIS — F32A Depression, unspecified: Secondary | ICD-10-CM | POA: Diagnosis not present

## 2023-10-20 DIAGNOSIS — R7881 Bacteremia: Secondary | ICD-10-CM | POA: Diagnosis not present

## 2023-10-20 DIAGNOSIS — R739 Hyperglycemia, unspecified: Secondary | ICD-10-CM | POA: Diagnosis not present

## 2023-11-10 DIAGNOSIS — M25562 Pain in left knee: Secondary | ICD-10-CM | POA: Diagnosis not present

## 2023-11-18 ENCOUNTER — Other Ambulatory Visit: Payer: Self-pay | Admitting: Family Medicine

## 2023-11-18 ENCOUNTER — Inpatient Hospital Stay
Admission: RE | Admit: 2023-11-18 | Discharge: 2023-11-18 | Disposition: A | Discharge status: Home / Self Care | Payer: Self-pay | Source: Ambulatory Visit | Attending: Neurosurgery | Admitting: Neurosurgery

## 2023-11-18 DIAGNOSIS — Z049 Encounter for examination and observation for unspecified reason: Secondary | ICD-10-CM

## 2023-11-19 NOTE — Progress Notes (Signed)
 Referring Physician:  Brant Caldron, FNP [id number] [place],  [place] [address]  Primary Physician:  Lydia Gold, MD  History of Present Illness: 11/24/2023 [name] is here today with a chief complaint of back pain and claudication.  She has a history of low back pain.  It radiates down the left thigh sometimes going to the anterior thigh and knee but mostly going down the back of the leg to the top of her foot.  This causes intermittent numbness and tingling.  Back in November when her symptoms started she did have weakness but felt like this is slowly gotten better.  She does feel like her pain has eased up overall.  She does have some continued numbness in her toes.  She has been working with pain and physical therapy and has been having improvement with her injections as well as her exercises.  She is also seeing orthopedics for her left knee and is planned for an MRI.  Conservative measures:  Physical therapy:  has participated in [facility] at [facility] therapy including regular antiinflammatories:  gabapentin, Hydrocodone, Prednisone, tramadol, Tylenol Injections: 10/17/2023, 08/20/2023 Left S1 epidural steroid injections  Past Surgery: none  The symptoms are causing a significant impact on the patient's life.   I have utilized the care everywhere function in epic to review the outside records available from external health systems.  Review of Systems:  A 10 point review of systems is negative, except for the pertinent positives and negatives detailed in the HPI.  Past Medical History: Past Medical History:  Diagnosis Date   Anemia    Anxiety    a.) on BZO   Aortic atherosclerosis (HCC)    Arthritis    hands, knees, toes   Bone island 10/15/2016   a.) RIGHT humeral head   CAD (coronary artery disease)    a.) 2010 --> PCI with stent placement x 1 (type location unknown)   Cataract    Depression    GERD (gastroesophageal reflux  disease)    Hyperlipidemia    Hypertension    IBS (irritable bowel syndrome)    Insomnia    a.) on zolpidem   Nodule of right lung 10/18/2016   Psoriatic arthritis (HCC)    Takotsubo cardiomyopathy    Thalassemia    Vertigo    none - 10 yrs   Vitamin D deficiency 11/15/2022   Wears dentures    partial lower    Past Surgical History: Past Surgical History:  Procedure Laterality Date   APPENDECTOMY  08/12/1965   CARPAL TUNNEL RELEASE  08/12/2005   CATARACT EXTRACTION  08/13/2011   CHOLECYSTECTOMY  08/12/1998   COLONOSCOPY  03/11/2014   Dr.Wohl   COLONOSCOPY WITH PROPOFOL N/A 12/11/2015   Procedure: COLONOSCOPY WITH PROPOFOL;  Surgeon: Lydia Sink, MD;  Location: [facility];  Service: Endoscopy;  Laterality: N/A;  DESCENDING COLON POLYP    CORONARY ANGIOPLASTY WITH STENT PLACEMENT  08/12/2008   ESOPHAGOGASTRODUODENOSCOPY  02/21/2014   Dr.Wohl   ESOPHAGOGASTRODUODENOSCOPY (EGD) WITH PROPOFOL N/A 12/11/2015   Procedure: ESOPHAGOGASTRODUODENOSCOPY (EGD) WITH PROPOFOL;  Surgeon: Lydia Sink, MD;  Location: [facility];  Service: Endoscopy;  Laterality: N/A;   INCISION AND DRAINAGE Right 12/17/2021   Procedure: INCISION AND DRAINAGE;  Surgeon: Lydia Bushy, MD;  Location: [facility];  Service: Orthopedics;  Laterality: Right;   KNEE ARTHROSCOPY Right 10/04/2021   Procedure: ARTHROSCOPY KNEE;  Surgeon: Lydia Bushy, MD;  Location: [facility];  Service: Orthopedics;  Laterality: Right;   KNEE ARTHROSCOPY Right 10/30/2021   Procedure: ARTHROSCOPY KNEE, I&D OF SUPERFICIAL ABSCESS;  Surgeon: Lydia Bushy, MD;  Location: [facility];  Service: Orthopedics;  Laterality: Right;   ORIF HUMERUS FRACTURE Left 11/14/2022   Procedure: OPEN REDUCTION INTERNAL FIXATION (ORIF) PROXIMAL HUMERUS FRACTURE;  Surgeon: Lydia Lia, MD;  Location: [facility];  Service: Orthopedics;  Laterality: Left;   POLYPECTOMY N/A 12/11/2015   Procedure: POLYPECTOMY;  Surgeon: Lydia Sink, MD;   Location: [facility];  Service: Endoscopy;  Laterality: N/A;   TONSILLECTOMY     TONSILLECTOMY AND ADENOIDECTOMY  08/13/1967    Allergies: Allergies as of 11/24/2023 - Review Complete 11/24/2023  Allergen Reaction Noted   Rosuvastatin  10/12/2019    Medications:  Current Outpatient Medications:    acetaminophen (TYLENOL) 500 MG tablet, Take 1 tablet (500 mg total) by mouth every 12 (twelve) hours. (Patient taking differently: Take 500 mg by mouth every 12 (twelve) hours as needed for fever.), Disp: 30 tablet, Rfl: 0   adalimumab (HUMIRA) 40 MG/0.4ML pen, Inject into the skin., Disp: , Rfl:    alendronate (FOSAMAX) 70 MG tablet, Take 70 mg by mouth once a week. Sunday, Disp: , Rfl:    Ascorbic Acid (VITAMIN C) 1000 MG tablet, Take 1 tablet (1,000 mg total) by mouth daily., Disp: 30 tablet, Rfl: 2   atorvastatin (LIPITOR) 20 MG tablet, Take 20 mg by mouth daily., Disp: , Rfl:    busPIRone (BUSPAR) 5 MG tablet, Take 5 mg by mouth daily., Disp: , Rfl:    carvedilol (COREG) 3.125 MG tablet, Take 3.125 mg by mouth 2 (two) times daily with a meal., Disp: , Rfl:    Cholecalciferol 125 MCG (5000 UT) TABS, Take 1 tablet (5,000 Units total) by mouth daily., Disp: 30 tablet, Rfl: 6   diclofenac Sodium (VOLTAREN) 1 % GEL, Apply 1 application topically 2 (two) times daily as needed (pain)., Disp: , Rfl:    escitalopram (LEXAPRO) 20 MG tablet, TAKE 1 TABLET BY MOUTH EVERY DAY (Patient taking differently: Take 20 mg by mouth at bedtime.), Disp: 90 tablet, Rfl: 3   losartan (COZAAR) 25 MG tablet, Take 25 mg by mouth 2 (two) times a day. , Disp: , Rfl:    omeprazole (PRILOSEC) 20 MG capsule, Take 20 mg by mouth daily., Disp: , Rfl:    predniSONE (DELTASONE) 5 MG tablet, Take by mouth., Disp: , Rfl:    zolpidem (AMBIEN) 10 MG tablet, Take 5 mg by mouth at bedtime as needed for sleep., Disp: , Rfl:   Social History: Social History   Tobacco Use   Smoking status: Former    Current packs/day:  0.00    Average packs/day: 0.5 packs/day for 1 year (0.5 ttl pk-yrs)    Types: Cigarettes    Start date: 1    Quit date: 42    Years since quitting: 53.3   Smokeless tobacco: Never   Tobacco comments:    smoked occasionally  Vaping Use   Vaping status: Never Used  Substance Use Topics   Alcohol use: Yes    Alcohol/week: 1.0 standard drink of alcohol    Types: 1 Shots of liquor per week    Comment: socially   Drug use: No    Family Medical History: Family History  Problem Relation Age of Onset   Cirrhosis Mother    Hepatitis Mother    Thalassemia Mother    Diabetes Mother    Heart disease Father    Diabetes Father  Hyperlipidemia Father    Hypertension Father    Stroke Father    Cancer Sister        lung   Diabetes Sister    Stroke Sister        during a surgery   Diabetes Brother    Heart disease Brother    Hypertension Brother    Stroke Brother    Thalassemia Maternal Grandmother    COPD Neg Hx     Physical Examination: Vitals:   11/24/23 0955  BP: (!) 142/72    General: Patient is in no apparent distress. Attention to examination is appropriate.  Neck:   Supple.  Full range of motion.  Respiratory: Patient is breathing without any difficulty.   NEUROLOGICAL:     Awake, alert, oriented to person, place, and time.  Speech is clear and fluent.   Cranial Nerves: Pupils equal round and reactive to light.  Facial tone is symmetric.  Facial sensation is symmetric. Shoulder shrug is symmetric. Tongue protrusion is midline.    Strength:  Side Iliopsoas Quads Hamstring PF DF EHL  R 5 5 5 5 5 5   L 5 5 5 5  4+ 3   Reflexes are 2+ and symmetric at the biceps, triceps, brachioradialis, patella and achilles.   Hoffman's is absent. Clonus is absent  Bilateral upper and lower extremity sensation is intact to light touch, SLR postivie.   Gait is normal.    Imaging: Narrative & Impression  CLINICAL DATA:  4-6 week history of back pain   EXAM: MRI  LUMBAR SPINE WITHOUT CONTRAST   TECHNIQUE: Multiplanar, multisequence MR imaging of the lumbar spine was performed. No intravenous contrast was administered.   COMPARISON:  None Available.   FINDINGS: Segmentation:  Standard.   Alignment:  Grade 1 anterolisthesis of L4 on L5   Vertebrae: No fracture, evidence of discitis, or bone lesion. Degenerative endplate changes of the inferior and superior endplates of L2 on L3   Conus medullaris and cauda equina: Conus extends to the L1-L2 disc space level. Conus and cauda equina appear normal.   Paraspinal and other soft tissues: Negative.   Disc levels:   T12-L1: Small central disc protrusion. Mild bilateral facet degenerative change. No spinal canal narrowing. No neural foraminal narrowing.   L1-L2: Left paracentral disc extrusion with cranial migration of the extruded disc material. Mild-to-moderate spinal canal narrowing. Mild bilateral neural foraminal narrowing. Mild bilateral facet degenerative change. There is narrowing of the left lateral recess   L2-L3: Moderate disc space loss. Eccentric left disc bulge. Mild bilateral facet degenerative change. Mild-to-moderate spinal canal narrowing. Moderate left and moderate to severe right neural foraminal narrowing.   L3-L4: Mild bilateral facet degenerative change. Circumferential disc bulge. Mild spinal canal narrowing. Moderate bilateral neural foraminal narrowing.   L4-L5: Moderate bilateral facet degenerative change, left-greater-than-right. Circumferential disc bulge. The extraforaminal component of the circumferential disc bulge may contact the exited right L4 nerve root (series 113, image 30). Ligamentum flavum hypertrophy. Mild overall spinal canal narrowing. Moderate bilateral neural foraminal narrowing. There is narrowing of the left lateral recess.   L5-S1: Severe left and mild right facet degenerative change. Circumferential disc bulge. The foraminal to  extraforaminal component of the disc bulge may contact the exited left L5 nerve root (series 113, image 36). Mild overall spinal canal narrowing. Severe narrowing of the left lateral recess. Moderate to severe left neural foraminal narrowing.   IMPRESSION: 1. Multilevel degenerative changes, most notably at L5-S1 where there is an eccentric left  disc bulge that severely narrows the left lateral recess with mass effect on the descending left S1 nerve root. This disc bulge likely also contacts the exited left L5 nerve root in the foraminal/extraforaminal zone. 2. Eccentric left disc bulge at L4-L5 likely contacts the exited left L4 nerve root in the extraforaminal zone. 3. Left parcentral disc extrusion at L1-L2 with narrowing of the left lateral recess. 4. Additional degenerative changes, as above.     Electronically Signed   By: Clora Dane M.D.   On: 07/30/2023 08:28   I have personally reviewed the images and agree with the above interpretation.  Outside record review Reviewed notes from Dr. Erman Hayward has notes as well as Ms. Meeler.  They been treating her for predominantly left side lumbosacral radiculitis, she has tried injections as well as had a extensive physical therapy regimen.  Medical Decision Making/Assessment and Plan: Ms. Rawdon is a pleasant 73 y.o. female with lumbar claudication/radiculopathy.  She has a history of back pain but mostly leg pain that radiates from her back down the posterior aspect of her leg to the lateral and anterior part of her lower leg and foot.  This been going on since November.  She was unable to walk.  She had weakness at first.  This is gotten somewhat better with physical therapy and injections.  She still gets claudicatory symptoms when she stands or walks for any extended period.  She has difficulty staying on her feet for long period of time.  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  I feel like her most symptomatic locations are from L4-S1 in the lateral recess at L4-5 and then in the foramen at L5-S1.  She has positive straight leg raise.  She continues to have EHL weakness.  Will plan on getting lumbar flexion-extension x-rays to evaluate for any instability.  I did discuss with her that she would meet criteria for a lumbar decompression given her ongoing claudication without complete improvement with physical therapy.  She would like to think about this with her family.  She does have a large trip planned at the end of the year and wants to be able to walk during this trip, at this point she is concerned that her ability to ambulate for long periods or stand for long periods is too limited.  Will plan on seeing her back in approximately 6 weeks.  Thank you for involving me in the care of this patient.    Carroll Clamp MD/MSCR Neurosurgery

## 2023-11-24 ENCOUNTER — Encounter: Payer: Self-pay | Admitting: Neurosurgery

## 2023-11-24 ENCOUNTER — Ambulatory Visit: Admitting: Neurosurgery

## 2023-11-24 ENCOUNTER — Ambulatory Visit
Admission: RE | Admit: 2023-11-24 | Discharge: 2023-11-24 | Disposition: A | Discharge status: Home / Self Care | Source: Ambulatory Visit | Attending: Neurosurgery | Admitting: Neurosurgery

## 2023-11-24 ENCOUNTER — Ambulatory Visit
Admission: RE | Admit: 2023-11-24 | Discharge: 2023-11-24 | Disposition: A | Discharge status: Home / Self Care | Attending: Neurosurgery | Admitting: Neurosurgery

## 2023-11-24 VITALS — BP 142/72 | Ht 61.0 in | Wt 151.0 lb

## 2023-11-24 DIAGNOSIS — M5416 Radiculopathy, lumbar region: Secondary | ICD-10-CM

## 2023-11-24 DIAGNOSIS — M5126 Other intervertebral disc displacement, lumbar region: Secondary | ICD-10-CM | POA: Diagnosis not present

## 2023-11-24 DIAGNOSIS — M51369 Other intervertebral disc degeneration, lumbar region without mention of lumbar back pain or lower extremity pain: Secondary | ICD-10-CM | POA: Diagnosis not present

## 2023-11-24 DIAGNOSIS — M47816 Spondylosis without myelopathy or radiculopathy, lumbar region: Secondary | ICD-10-CM | POA: Diagnosis not present

## 2023-11-24 DIAGNOSIS — M48061 Spinal stenosis, lumbar region without neurogenic claudication: Secondary | ICD-10-CM | POA: Diagnosis not present

## 2023-11-24 DIAGNOSIS — M48062 Spinal stenosis, lumbar region with neurogenic claudication: Secondary | ICD-10-CM | POA: Diagnosis not present

## 2023-11-25 DIAGNOSIS — M25562 Pain in left knee: Secondary | ICD-10-CM | POA: Diagnosis not present

## 2023-11-26 DIAGNOSIS — M48062 Spinal stenosis, lumbar region with neurogenic claudication: Secondary | ICD-10-CM | POA: Diagnosis not present

## 2023-11-26 DIAGNOSIS — M5126 Other intervertebral disc displacement, lumbar region: Secondary | ICD-10-CM | POA: Diagnosis not present

## 2023-11-26 DIAGNOSIS — M5416 Radiculopathy, lumbar region: Secondary | ICD-10-CM | POA: Diagnosis not present

## 2023-12-01 DIAGNOSIS — S72402A Unspecified fracture of lower end of left femur, initial encounter for closed fracture: Secondary | ICD-10-CM | POA: Diagnosis not present

## 2023-12-01 DIAGNOSIS — S72492A Other fracture of lower end of left femur, initial encounter for closed fracture: Secondary | ICD-10-CM | POA: Diagnosis not present

## 2023-12-12 ENCOUNTER — Encounter: Payer: Self-pay | Admitting: Neurosurgery

## 2023-12-19 DIAGNOSIS — E782 Mixed hyperlipidemia: Secondary | ICD-10-CM | POA: Diagnosis not present

## 2023-12-19 DIAGNOSIS — I2510 Atherosclerotic heart disease of native coronary artery without angina pectoris: Secondary | ICD-10-CM | POA: Diagnosis not present

## 2023-12-19 DIAGNOSIS — I5181 Takotsubo syndrome: Secondary | ICD-10-CM | POA: Diagnosis not present

## 2023-12-19 DIAGNOSIS — I10 Essential (primary) hypertension: Secondary | ICD-10-CM | POA: Diagnosis not present

## 2024-01-12 ENCOUNTER — Ambulatory Visit: Admitting: Neurosurgery

## 2024-01-13 DIAGNOSIS — R7881 Bacteremia: Secondary | ICD-10-CM | POA: Diagnosis not present

## 2024-01-13 DIAGNOSIS — R739 Hyperglycemia, unspecified: Secondary | ICD-10-CM | POA: Diagnosis not present

## 2024-01-13 DIAGNOSIS — I2510 Atherosclerotic heart disease of native coronary artery without angina pectoris: Secondary | ICD-10-CM | POA: Diagnosis not present

## 2024-01-13 DIAGNOSIS — Z0000 Encounter for general adult medical examination without abnormal findings: Secondary | ICD-10-CM | POA: Diagnosis not present

## 2024-01-13 DIAGNOSIS — E782 Mixed hyperlipidemia: Secondary | ICD-10-CM | POA: Diagnosis not present

## 2024-01-13 DIAGNOSIS — E538 Deficiency of other specified B group vitamins: Secondary | ICD-10-CM | POA: Diagnosis not present

## 2024-01-13 DIAGNOSIS — F32A Depression, unspecified: Secondary | ICD-10-CM | POA: Diagnosis not present

## 2024-01-13 DIAGNOSIS — B9620 Unspecified Escherichia coli [E. coli] as the cause of diseases classified elsewhere: Secondary | ICD-10-CM | POA: Diagnosis not present

## 2024-01-28 DIAGNOSIS — S40022A Contusion of left upper arm, initial encounter: Secondary | ICD-10-CM | POA: Diagnosis not present

## 2024-01-28 DIAGNOSIS — S42302D Unspecified fracture of shaft of humerus, left arm, subsequent encounter for fracture with routine healing: Secondary | ICD-10-CM | POA: Diagnosis not present

## 2024-02-03 DIAGNOSIS — R7303 Prediabetes: Secondary | ICD-10-CM | POA: Diagnosis not present

## 2024-02-03 DIAGNOSIS — F32A Depression, unspecified: Secondary | ICD-10-CM | POA: Diagnosis not present

## 2024-02-03 DIAGNOSIS — E782 Mixed hyperlipidemia: Secondary | ICD-10-CM | POA: Diagnosis not present

## 2024-02-03 DIAGNOSIS — I10 Essential (primary) hypertension: Secondary | ICD-10-CM | POA: Diagnosis not present

## 2024-02-03 DIAGNOSIS — L4050 Arthropathic psoriasis, unspecified: Secondary | ICD-10-CM | POA: Diagnosis not present

## 2024-02-04 DIAGNOSIS — M5416 Radiculopathy, lumbar region: Secondary | ICD-10-CM | POA: Diagnosis not present

## 2024-02-04 DIAGNOSIS — M48062 Spinal stenosis, lumbar region with neurogenic claudication: Secondary | ICD-10-CM | POA: Diagnosis not present

## 2024-02-09 ENCOUNTER — Ambulatory Visit: Admitting: Neurosurgery

## 2024-02-09 VITALS — BP 132/52 | Ht 62.0 in | Wt 151.5 lb

## 2024-02-09 DIAGNOSIS — M5416 Radiculopathy, lumbar region: Secondary | ICD-10-CM | POA: Diagnosis not present

## 2024-02-09 DIAGNOSIS — L409 Psoriasis, unspecified: Secondary | ICD-10-CM | POA: Diagnosis not present

## 2024-02-09 DIAGNOSIS — M48061 Spinal stenosis, lumbar region without neurogenic claudication: Secondary | ICD-10-CM

## 2024-02-09 DIAGNOSIS — Z7960 Long term (current) use of unspecified immunomodulators and immunosuppressants: Secondary | ICD-10-CM | POA: Diagnosis not present

## 2024-02-09 DIAGNOSIS — L4050 Arthropathic psoriasis, unspecified: Secondary | ICD-10-CM | POA: Diagnosis not present

## 2024-02-09 DIAGNOSIS — M170 Bilateral primary osteoarthritis of knee: Secondary | ICD-10-CM | POA: Diagnosis not present

## 2024-02-09 HISTORY — DX: Radiculopathy, lumbar region: M54.16

## 2024-02-09 NOTE — Progress Notes (Signed)
 Referring Physician:  No referring provider defined for this encounter.  Primary Physician:  Epifanio Alm SQUIBB, MD  History of Present Illness: 02/09/2024 [name] is here today with a chief complaint of back pain and claudication she has a history of of low back pain and radiculopathy.  She has been getting injections.  This is mostly been left-sided.  We discussed surgical intervention at her last visit but she wanted to try conservative care to evaluate whether or not she would be able to get significant recovery.  She just recently had her second injection.  She has not yet noticed any changes, however it is still quite early in her recovery.  She does feel like this continues to nag and she does intermittently notice the weakness including some foot drag.  Conservative measures:  Physical therapy:  has participated in Pt at [facility] therapy including regular antiinflammatories:  gabapentin, Hydrocodone , Prednisone , tramadol , Tylenol  Injections: 10/17/2023, 08/20/2023 Left S1 epidural steroid injections, repeat injections last week  Past Surgery: none  The symptoms are causing a significant impact on the patient's life.   I have utilized the care everywhere function in epic to review the outside records available from external health systems.  Review of Systems:  A 10 point review of systems is negative, except for the pertinent positives and negatives detailed in the HPI.  Past Medical History: Past Medical History:  Diagnosis Date   Anemia    Anxiety    a.) on BZO   Aortic atherosclerosis (HCC)    Arthritis    hands, knees, toes   Bone island 10/15/2016   a.) RIGHT humeral head   CAD (coronary artery disease)    a.) 2010 --> PCI with stent placement x 1 (type location unknown)   Cataract    Depression    GERD (gastroesophageal reflux disease)    Hyperlipidemia    Hypertension    IBS (irritable bowel syndrome)    Insomnia    a.) on  zolpidem    Nodule of right lung 10/18/2016   Psoriatic arthritis (HCC)    Takotsubo cardiomyopathy    Thalassemia    Vertigo    none - 10 yrs   Vitamin D  deficiency 11/15/2022   Wears dentures    partial lower    Past Surgical History: Past Surgical History:  Procedure Laterality Date   APPENDECTOMY  08/12/1965   CARPAL TUNNEL RELEASE  08/12/2005   CATARACT EXTRACTION  08/13/2011   CHOLECYSTECTOMY  08/12/1998   COLONOSCOPY  03/11/2014   Dr.Wohl   COLONOSCOPY WITH PROPOFOL  N/A 12/11/2015   Procedure: COLONOSCOPY WITH PROPOFOL ;  Surgeon: Rogelia Copping, MD;  Location: [facility];  Service: Endoscopy;  Laterality: N/A;  DESCENDING COLON POLYP    CORONARY ANGIOPLASTY WITH STENT PLACEMENT  08/12/2008   ESOPHAGOGASTRODUODENOSCOPY  02/21/2014   Dr.Wohl   ESOPHAGOGASTRODUODENOSCOPY (EGD) WITH PROPOFOL  N/A 12/11/2015   Procedure: ESOPHAGOGASTRODUODENOSCOPY (EGD) WITH PROPOFOL ;  Surgeon: Rogelia Copping, MD;  Location: [facility];  Service: Endoscopy;  Laterality: N/A;   INCISION AND DRAINAGE Right 12/17/2021   Procedure: INCISION AND DRAINAGE;  Surgeon: Marchia Drivers, MD;  Location: [facility];  Service: Orthopedics;  Laterality: Right;   KNEE ARTHROSCOPY Right 10/04/2021   Procedure: ARTHROSCOPY KNEE;  Surgeon: Marchia Drivers, MD;  Location: [facility];  Service: Orthopedics;  Laterality: Right;   KNEE ARTHROSCOPY Right 10/30/2021   Procedure: ARTHROSCOPY KNEE, I&D OF SUPERFICIAL ABSCESS;  Surgeon: Marchia Drivers, MD;  Location: [facility];  Service: Orthopedics;  Laterality: Right;   ORIF HUMERUS FRACTURE Left 11/14/2022   Procedure: OPEN REDUCTION INTERNAL FIXATION (ORIF) PROXIMAL HUMERUS FRACTURE;  Surgeon: Celena Sharper, MD;  Location: [facility];  Service: Orthopedics;  Laterality: Left;   POLYPECTOMY N/A 12/11/2015   Procedure: POLYPECTOMY;  Surgeon: Rogelia Copping, MD;  Location: [facility];  Service: Endoscopy;  Laterality: N/A;   TONSILLECTOMY     TONSILLECTOMY AND  ADENOIDECTOMY  08/13/1967    Allergies: Allergies as of 02/09/2024 - Review Complete 02/09/2024  Allergen Reaction Noted   Rosuvastatin  10/12/2019    Medications:  Current Outpatient Medications:    acetaminophen  (TYLENOL ) 500 MG tablet, Take 1 tablet (500 mg total) by mouth every 12 (twelve) hours. (Patient taking differently: Take 500 mg by mouth every 12 (twelve) hours as needed for fever.), Disp: 30 tablet, Rfl: 0   adalimumab  (HUMIRA ) 40 MG/0.4ML pen, Inject into the skin., Disp: , Rfl:    Ascorbic Acid  (VITAMIN C ) 1000 MG tablet, Take 1 tablet (1,000 mg total) by mouth daily., Disp: 30 tablet, Rfl: 2   atorvastatin  (LIPITOR) 20 MG tablet, Take 20 mg by mouth daily., Disp: , Rfl:    busPIRone  (BUSPAR ) 5 MG tablet, Take 5 mg by mouth daily., Disp: , Rfl:    carvedilol  (COREG ) 3.125 MG tablet, Take 3.125 mg by mouth 2 (two) times daily with a meal., Disp: , Rfl:    Cholecalciferol  125 MCG (5000 UT) TABS, Take 1 tablet (5,000 Units total) by mouth daily., Disp: 30 tablet, Rfl: 6   diclofenac Sodium (VOLTAREN) 1 % GEL, Apply 1 application topically 2 (two) times daily as needed (pain)., Disp: , Rfl:    escitalopram  (LEXAPRO ) 20 MG tablet, TAKE 1 TABLET BY MOUTH EVERY DAY (Patient taking differently: Take 20 mg by mouth at bedtime.), Disp: 90 tablet, Rfl: 3   losartan  (COZAAR ) 25 MG tablet, Take 25 mg by mouth 2 (two) times a day. , Disp: , Rfl:    omeprazole (PRILOSEC) 20 MG capsule, Take 20 mg by mouth daily., Disp: , Rfl:    predniSONE  (DELTASONE ) 5 MG tablet, Take by mouth., Disp: , Rfl:    zolpidem  (AMBIEN ) 10 MG tablet, Take 5 mg by mouth at bedtime as needed for sleep., Disp: , Rfl:   Social History: Social History   Tobacco Use   Smoking status: Former    Current packs/day: 0.00    Average packs/day: 0.5 packs/day for 1 year (0.5 ttl pk-yrs)    Types: Cigarettes    Start date: 10    Quit date: 44    Years since quitting: 53.5   Smokeless tobacco: Never   Tobacco  comments:    smoked occasionally  Vaping Use   Vaping status: Never Used  Substance Use Topics   Alcohol use: Yes    Alcohol/week: 1.0 standard drink of alcohol    Types: 1 Shots of liquor per week    Comment: socially   Drug use: No    Family Medical History: Family History  Problem Relation Age of Onset   Cirrhosis Mother    Hepatitis Mother    Thalassemia Mother    Diabetes Mother    Heart disease Father    Diabetes Father    Hyperlipidemia Father    Hypertension Father    Stroke Father    Cancer Sister        lung   Diabetes Sister    Stroke Sister        during a surgery   Diabetes  Brother    Heart disease Brother    Hypertension Brother    Stroke Brother    Thalassemia Maternal Grandmother    COPD Neg Hx     Physical Examination: Vitals:   02/09/24 1051  BP: (!) 132/52    General: Patient is in no apparent distress. Attention to examination is appropriate.  Neck:   Supple.  Full range of motion.  Respiratory: Patient is breathing without any difficulty.   NEUROLOGICAL:     Awake, alert, oriented to person, place, and time.  Speech is clear and fluent.   Cranial Nerves: Pupils equal round and reactive to light.  Facial tone is symmetric.  Facial sensation is symmetric. Shoulder shrug is symmetric. Tongue protrusion is midline.    Strength:  Side Iliopsoas Quads Hamstring PF DF EHL  R 5 5 5 5 5 5   L 5 5 5 5  4+ 3   Reflexes are 2+ and symmetric at the biceps, triceps, brachioradialis, patella and achilles.   Hoffman's is absent. Clonus is absent  Bilateral upper and lower extremity sensation is intact to light touch, SLR postivie.   Very slight hip drop and steppage gait noted  Imaging: Narrative & Impression  CLINICAL DATA:  4-6 week history of back pain   EXAM: MRI LUMBAR SPINE WITHOUT CONTRAST   TECHNIQUE: Multiplanar, multisequence MR imaging of the lumbar spine was performed. No intravenous contrast was administered.   COMPARISON:   None Available.   FINDINGS: Segmentation:  Standard.   Alignment:  Grade 1 anterolisthesis of L4 on L5   Vertebrae: No fracture, evidence of discitis, or bone lesion. Degenerative endplate changes of the inferior and superior endplates of L2 on L3   Conus medullaris and cauda equina: Conus extends to the L1-L2 disc space level. Conus and cauda equina appear normal.   Paraspinal and other soft tissues: Negative.   Disc levels:   T12-L1: Small central disc protrusion. Mild bilateral facet degenerative change. No spinal canal narrowing. No neural foraminal narrowing.   L1-L2: Left paracentral disc extrusion with cranial migration of the extruded disc material. Mild-to-moderate spinal canal narrowing. Mild bilateral neural foraminal narrowing. Mild bilateral facet degenerative change. There is narrowing of the left lateral recess   L2-L3: Moderate disc space loss. Eccentric left disc bulge. Mild bilateral facet degenerative change. Mild-to-moderate spinal canal narrowing. Moderate left and moderate to severe right neural foraminal narrowing.   L3-L4: Mild bilateral facet degenerative change. Circumferential disc bulge. Mild spinal canal narrowing. Moderate bilateral neural foraminal narrowing.   L4-L5: Moderate bilateral facet degenerative change, left-greater-than-right. Circumferential disc bulge. The extraforaminal component of the circumferential disc bulge may contact the exited right L4 nerve root (series 113, image 30). Ligamentum flavum hypertrophy. Mild overall spinal canal narrowing. Moderate bilateral neural foraminal narrowing. There is narrowing of the left lateral recess.   L5-S1: Severe left and mild right facet degenerative change. Circumferential disc bulge. The foraminal to extraforaminal component of the disc bulge may contact the exited left L5 nerve root (series 113, image 36). Mild overall spinal canal narrowing. Severe narrowing of the left lateral  recess. Moderate to severe left neural foraminal narrowing.   IMPRESSION: 1. Multilevel degenerative changes, most notably at L5-S1 where there is an eccentric left disc bulge that severely narrows the left lateral recess with mass effect on the descending left S1 nerve root. This disc bulge likely also contacts the exited left L5 nerve root in the foraminal/extraforaminal zone. 2. Eccentric left disc bulge at L4-L5 likely contacts  the exited left L4 nerve root in the extraforaminal zone. 3. Left parcentral disc extrusion at L1-L2 with narrowing of the left lateral recess. 4. Additional degenerative changes, as above.     Electronically Signed   By: Lyndall Gore M.D.   On: 07/30/2023 08:28   I have personally reviewed the images and agree with the above interpretation.  Outside record review Reviewed notes from Dr. Avanell has notes as well as Ms. Meeler.  They been treating her for predominantly left side lumbosacral radiculitis, she has tried injections as well as had a extensive physical therapy regimen.  Medical Decision Making/Assessment and Plan: Ms. Buikema is a pleasant 73 y.o. female with lumbar claudication/radiculopathy.  She has a history of back pain but mostly leg pain that radiates from her back down the posterior aspect of her leg to the lateral and anterior part of her lower leg and foot.  This been going on since November.  At the initial onset this was quite severe she had difficulty with walking and weakness but did get somewhat better.  She was working with physical therapy and injections.  She does get often claudicatory symptoms when she is standing.  Her pain continues to go down her left lower extremity most significant levels are again at the L4-S1 levels as she has significant stenosis at the L4-5 lateral recess and the foramen of L5-S1.  I like to continue to follow with her, if she does not have recovery after her injections we will plan for a decompression  procedure.  This will be for her foraminal stenosis as well as her lateral recess stenosis at L5-S1 and L4-5 on the left respectively.  I would like to follow-up with her via phone in approximately 3 weeks to see how she is doing with the injection care.  Thank you for involving me in the care of this patient.    Penne MICAEL Sharps MD/MSCR Neurosurgery

## 2024-03-01 ENCOUNTER — Ambulatory Visit: Admitting: Neurosurgery

## 2024-03-01 DIAGNOSIS — M5416 Radiculopathy, lumbar region: Secondary | ICD-10-CM

## 2024-03-01 DIAGNOSIS — M48062 Spinal stenosis, lumbar region with neurogenic claudication: Secondary | ICD-10-CM | POA: Insufficient documentation

## 2024-03-01 NOTE — Progress Notes (Signed)
 I had a follow-up phone visit today with Lydia Elliott.  She was at home and I was in the office.  She gave consent to go forward with a phone visit discuss her lumbar stenosis and claudication.  She continues to have symptoms, this causes significant limitations in her ambulatory function.  We are planning on talking after she got injections and more physical therapy.  At this point she continues to have concerning symptoms.  She continues to have progressive difficulty with her ambulatory function.  I did discuss that at her last appointment she would likely benefit from an L4-S1 decompression on the left predominantly.  She like to continue to discuss this with her family and think about it.  Will plan on reaching out to her tomorrow if she would like to go forward with a decompression.  With the planis off of her Humira  injections as well as her lumbar spine injections.  Plan on at least 2 weeks from Humira  and 6 weeks from her last spinal injection as she is not having significant improvement.  Spent a total of 10 minutes on this phone visit.

## 2024-03-05 ENCOUNTER — Encounter: Payer: Self-pay | Admitting: Neurosurgery

## 2024-03-11 ENCOUNTER — Ambulatory Visit: Payer: Self-pay | Admitting: Neurosurgery

## 2024-03-11 ENCOUNTER — Other Ambulatory Visit (INDEPENDENT_AMBULATORY_CARE_PROVIDER_SITE_OTHER): Payer: Self-pay | Admitting: Neurosurgery

## 2024-03-11 DIAGNOSIS — M5416 Radiculopathy, lumbar region: Secondary | ICD-10-CM

## 2024-03-11 DIAGNOSIS — M48062 Spinal stenosis, lumbar region with neurogenic claudication: Secondary | ICD-10-CM

## 2024-03-11 DIAGNOSIS — Z01818 Encounter for other preprocedural examination: Secondary | ICD-10-CM

## 2024-03-11 NOTE — Progress Notes (Signed)
 Surgical Decision-Making Note Diagnosis: Spinal stenosis, lumbar region, with neurogenic claudication  History & Indications: - Patient presents with chief complaint of back pain claudication and radiculopathy predominant symptoms in the left lower extremity - Duration: 9 months - Failed conservative management: She has tried physical therapy, and multiple injections. - Imaging findings: Severe stenosis of the left lateral recess at L5-S1, eccentric disc bulge at L4-5 contacting the L4 nerve root Clinical Decision: After reviewing the patient's history, physical exam, and imaging, I have discussed surgical options with the patient. Given the persistence of symptoms and failure of non-operative treatment, I recommend proceeding with: Planned Procedure: Left side, lumbar laminectomy, medial facetectomy, foraminotomies. L4-S1.  Risks, Benefits, and Alternatives Discussed: - Risks including but not limited to: Continued pain, incomplete relief, continued symptoms, nerve injury, CSF leak, bleeding, infection, destabilization - Benefits: Improved neurogenic claudication, improved radiculopathy, improved pain. - Alternatives: Continued conservative care Patient Understanding and Consent: The patient verbalizes understanding of the above and agrees to proceed with surgical intervention. Next Steps: - OR Case Request order placed - Pre-op labs and imaging ordered - Scheduling team to contact patient

## 2024-03-11 NOTE — H&P (Deleted)
 Erroneus encounter, please remove

## 2024-03-12 ENCOUNTER — Encounter: Payer: Self-pay | Admitting: Neurosurgery

## 2024-03-15 ENCOUNTER — Telehealth: Payer: Self-pay

## 2024-03-15 DIAGNOSIS — M5416 Radiculopathy, lumbar region: Secondary | ICD-10-CM

## 2024-03-15 DIAGNOSIS — M48062 Spinal stenosis, lumbar region with neurogenic claudication: Secondary | ICD-10-CM

## 2024-03-15 NOTE — Telephone Encounter (Signed)
 Sent a follow up message to Dr Claudene

## 2024-03-16 NOTE — Telephone Encounter (Signed)
 Planned surgery: Left side, lumbar laminectomy, medial facetectomy, foraminotomies. L4-S1.    Surgery date:  04/15/2024 at [facility]: [address] [place], [place] [address]) - you will find out your arrival time the business day before your surgery.   Pre-op appointment at [facility] Pre-admit Testing: you will receive a call with a date/time for this appointment. If you are scheduled for an in person appointment, Pre-admit Testing is located on the first floor of the [facility] building, [address], [address]. During this appointment, they will advise you which medications you can take the morning of surgery, and which medications you will need to hold for surgery. Labs (such as blood work, EKG) may be done at your pre-op appointment. You are not required to fast for these labs. Should you need to change your pre-op appointment, please call Pre-admit testing at [phone].     Humira : hold for 2 weeks prior to surgery and 2 weeks after surgery    Surgical clearance: we will send a clearance form to Annalee Casa, DO (cardiology). They may wish to see you in their office prior to signing the clearance form. If so, they may call you to schedule an appointment.      Common restrictions after spine surgery: No bending, lifting, or twisting ("BLT"). Avoid lifting objects heavier than 10 pounds for the first 6 weeks after surgery. Where possible, avoid household activities that involve lifting, bending, reaching, pushing, or pulling such as laundry, vacuuming, grocery shopping, and childcare. Try to arrange for help from friends and family for these activities while you heal. Do not drive while taking prescription pain medication. Weeks 6 through 12 after surgery: avoid lifting more than 25 pounds.     How to contact us :  If you have any questions/concerns before or after surgery, you can reach us  at [phone], or you can send a  mychart message. We can be reached by phone or mychart 8am-4pm, Monday-Friday.  *Please note: Calls after 4pm are forwarded to a third party answering service. Mychart messages are not routinely monitored during evenings, weekends, and holidays. Please call our office to contact the answering service for urgent concerns during non-business hours.   If you have FMLA/disability paperwork, please drop it off or fax it to [phone]   Appointments/FMLA & disability paperwork: [employer] Registered Nurse/Surgery scheduler: Nyjah Denio, RN Certified Medical Assistants: Don, CMA, Elenor, CMA, & Damien, CMA Physician Assistants: Lyle Decamp, PA-C, Edsel Goods, PA-C & Glade Boys, PA-C Surgeons: Penne Sharps, MD & Reeves Daisy, MD   [facility] PREADMIT TESTING VISIT and SURGERY INFORMATION SHEET   Now that surgery has been scheduled you can anticipate several phone calls from [facility] services. A pharmacy technician will call you to verify your current list of medications taken at home.               The Pre-Service Center will call to verify your insurance information and to give you billing estimates and information.             The Preadmit Testing Office will be calling to schedule a visit to obtain information for the anesthesia team and provide instructions on preparation for surgery.  What can you expect for the Preadmit Testing Visit: Appointments may be scheduled in-person or by telephone.  If a telephone visit is scheduled, you may be asked to come into the office to have lab tests or other studies performed.  This visit will not be completed any greater than 14 days prior to your surgery.  If your surgery has been scheduled for a future date, please do not be alarmed if we have not contacted you to schedule an appointment more than a month prior to the surgery date.    Please be prepared to provide the following information during this  appointment:            -Personal medical history                                               -Medication and allergy list            -Any history of problems with anesthesia              -Recent lab work or diagnostic studies            -Please notify us  of any needs we should be aware of to provide the best care possible           -You will be provided with instructions on how to prepare for your surgery.    On The Day of Surgery:  You must have a driver to take you home after surgery, you will be asked not to drive for 24 hours following surgery.  Taxi, Gisele and non-medical transport will not be acceptable means of transportation unless you have a responsible individual who will be traveling with you.  Visitors in the surgical area:   2 people will be able to visit you in your room once your preparation for surgery has been completed. During surgery, your visitors will be asked to wait in the Surgery Waiting Area.  It is not a requirement for them to stay, if they prefer to leave and come back.  Your visitor(s) will be given an update once the surgery has been completed.  No visitors are allowed in the initial recovery room to respect patient privacy and safety.  Once you are more awake and transfer to the secondary recovery area, or are transferred to an inpatient room, visitors will again be able to see you.  To respect and protect your privacy: We will ask on the day of surgery who your driver will be and what the contact number for that individual will be. We will ask if it is okay to share information with this individual, or if there is an alternative individual that we, or the surgeon, should contact to provide updates and information. If family or friends come to the surgical information desk requesting information about you, who you have not listed with us , no information will be given.   It may be helpful to designate someone as the main contact who will be responsible for  updating your other friends and family.    PREADMIT TESTING OFFICE: [phone] SAME DAY SURGERY: [phone] We look forward to caring for you before and throughout the process of your surgery.

## 2024-03-17 DIAGNOSIS — M170 Bilateral primary osteoarthritis of knee: Secondary | ICD-10-CM | POA: Diagnosis not present

## 2024-03-23 ENCOUNTER — Encounter: Payer: Self-pay | Admitting: Neurosurgery

## 2024-04-02 ENCOUNTER — Encounter
Admission: RE | Admit: 2024-04-02 | Discharge: 2024-04-02 | Disposition: A | Discharge status: Home / Self Care | Source: Ambulatory Visit | Attending: Neurosurgery | Admitting: Neurosurgery

## 2024-04-02 ENCOUNTER — Other Ambulatory Visit: Payer: Self-pay

## 2024-04-02 VITALS — BP 148/71 | HR 73 | Temp 98.0°F | Resp 18 | Ht 62.0 in | Wt 149.6 lb

## 2024-04-02 DIAGNOSIS — I2510 Atherosclerotic heart disease of native coronary artery without angina pectoris: Secondary | ICD-10-CM | POA: Diagnosis not present

## 2024-04-02 DIAGNOSIS — Z01810 Encounter for preprocedural cardiovascular examination: Secondary | ICD-10-CM

## 2024-04-02 DIAGNOSIS — I10 Essential (primary) hypertension: Secondary | ICD-10-CM | POA: Insufficient documentation

## 2024-04-02 DIAGNOSIS — Z01812 Encounter for preprocedural laboratory examination: Secondary | ICD-10-CM

## 2024-04-02 DIAGNOSIS — R9431 Abnormal electrocardiogram [ECG] [EKG]: Secondary | ICD-10-CM | POA: Diagnosis not present

## 2024-04-02 DIAGNOSIS — Z01818 Encounter for other preprocedural examination: Secondary | ICD-10-CM | POA: Diagnosis not present

## 2024-04-02 LAB — SURGICAL PCR SCREEN
MRSA, PCR: NEGATIVE
Staphylococcus aureus: NEGATIVE

## 2024-04-02 LAB — BASIC METABOLIC PANEL WITH GFR
Anion gap: 9 (ref 5–15)
BUN: 17 mg/dL (ref 8–23)
CO2: 24 mmol/L (ref 22–32)
Calcium: 8.9 mg/dL (ref 8.9–10.3)
Chloride: 105 mmol/L (ref 98–111)
Creatinine, Ser: 0.71 mg/dL (ref 0.44–1.00)
GFR, Estimated: >60 mL/min (ref >60)
Glucose, Bld: 109 mg/dL — ABNORMAL HIGH (ref 70–99)
Potassium: 4.3 mmol/L (ref 3.5–5.1)
Sodium: 138 mmol/L (ref 135–145)

## 2024-04-02 LAB — CBC
HCT: 36.2 % (ref 36.0–46.0)
Hemoglobin: 10.9 g/dL — ABNORMAL LOW (ref 12.0–15.0)
MCH: 20.4 pg — ABNORMAL LOW (ref 26.0–34.0)
MCHC: 30.1 g/dL (ref 30.0–36.0)
MCV: 67.8 fL — ABNORMAL LOW (ref 80.0–100.0)
Platelets: 220 K/uL (ref 150–400)
RBC: 5.34 MIL/uL — ABNORMAL HIGH (ref 3.87–5.11)
RDW: 15.6 % — ABNORMAL HIGH (ref 11.5–15.5)
WBC: 7.2 K/uL (ref 4.0–10.5)
nRBC: 0 % (ref 0.0–0.2)

## 2024-04-02 NOTE — Patient Instructions (Addendum)
 Your procedure is scheduled on:  THURSDAY SEPTEMBER 4  Report to the Registration Desk on the 1st floor of the [facility] To find out your arrival time, please call [phone] between 1PM - 3PM on:  [facility] SEPTEMBER 3  If your arrival time is 6:00 am, do not arrive before that time as the [facility] entrance doors do not open until 6:00 am.  REMEMBER: Instructions that are not followed completely may result in serious medical risk, up to and including death; or upon the discretion of your surgeon and anesthesiologist your surgery may need to be rescheduled.  Do not eat food after midnight the night before surgery.  No gum chewing or hard candies.  You may however, drink CLEAR liquids up to 2 hours before you are scheduled to arrive for your surgery. Do not drink anything within 2 hours of your scheduled arrival time.  Clear liquids include: - water   - apple juice without pulp - gatorade (not RED colors) - black coffee or tea (Do NOT add milk or creamers to the coffee or tea) Do NOT drink anything that is not on this list.  One week prior to surgery:  THURSDAY AUGUST 28  Stop Anti-inflammatories (NSAIDS) such as Advil, Aleve, Ibuprofen, Motrin, Naproxen, Naprosyn and Aspirin  based products such as Excedrin, Goody's Powder, BC Powder. Stop ANY OVER THE COUNTER supplements until after surgery. Ascorbic Acid  (VITAMIN C )  Cholecalciferol  ( D3)  You may however, continue to take Tylenol  if needed for pain up until the day of surgery.  adalimumab  (HUMIRA ) hold 2 weeks prior and 2 weeks post surgery. Last dose WEDNESDAY AUGUST 20    Continue taking all of your other prescription medications up until the day of surgery.  ON THE DAY OF SURGERY ONLY TAKE THESE MEDICATIONS WITH SIPS OF WATER :  carvedilol  (COREG )  omeprazole (PRILOSEC)   No Alcohol for 24 hours before or after surgery.  Do not use any recreational drugs for at least a week (preferably 2 weeks) before your  surgery.  Please be advised that the combination of cocaine and anesthesia may have negative outcomes, up to and including death. If you test positive for cocaine, your surgery will be cancelled.  On the morning of surgery brush your teeth with toothpaste and water , you may rinse your mouth with mouthwash if you wish. Do not swallow any toothpaste or mouthwash.  Use CHG Soap as directed on instruction sheet.  Do not wear jewelry, make-up, hairpins, clips or nail polish.  For welded (permanent) jewelry: bracelets, anklets, waist bands, etc.  Please have this removed prior to surgery.  If it is not removed, there is a chance that hospital personnel will need to cut it off on the day of surgery.  Do not wear lotions, powders, or perfumes.   Do not shave body hair from the neck down 48 hours before surgery.  Do not bring valuables to the hospital. [facility] is not responsible for any missing/lost belongings or valuables.   Notify your doctor if there is any change in your medical condition (cold, fever, infection).  Wear comfortable clothing (specific to your surgery type) to the hospital.  After surgery, you can help prevent lung complications by doing breathing exercises.  Take deep breaths and cough every 1-2 hours.   If you are being admitted to the hospital overnight, leave your suitcase in the car. After surgery it may be brought to your room.  In case of increased patient census, it may be  necessary for you, the patient, to continue your postoperative care in the Same Day Surgery department.  If you are being discharged the day of surgery, you will not be allowed to drive home. You will need a responsible individual to drive you home and stay with you for 24 hours after surgery.   If you are taking public transportation, you will need to have a responsible individual with you.  Please call the Pre-admissions Testing Dept. at [phone] if you have any questions about these  instructions.  Surgery Visitation Policy:  Patients having surgery or a procedure may have two visitors.  Children under the age of 30 must have an adult with them who is not the patient.  Inpatient Visitation:    Visiting hours are 7 a.m. to 8 p.m. Up to four visitors are allowed at one time in a patient room. The visitors may rotate out with other people during the day.  One visitor age 44 or older may stay with the patient overnight and must be in the room by 8 p.m.   Merchandiser, retail to address health-related social needs:  [email or web address]      Pre-operative 5 CHG Bath Instructions   You can play a key role in reducing the risk of infection after surgery. Your skin needs to be as free of germs as possible. You can reduce the number of germs on your skin by washing with CHG (chlorhexidine  gluconate) soap before surgery. CHG is an antiseptic soap that kills germs and continues to kill germs even after washing.   DO NOT use if you have an allergy to chlorhexidine /CHG or antibacterial soaps. If your skin becomes reddened or irritated, stop using the CHG and notify one of our RNs at [phone].   Please shower with the CHG soap starting 4 days before surgery using the following schedule:   STARTING SUNDAY AUGUST 31     Please keep in mind the following:  DO NOT shave, including legs and underarms, starting the day of your first shower.   You may shave your face at any point before/day of surgery.  Place clean sheets on your bed the day you start using CHG soap. Use a clean washcloth (not used since being washed) for each shower. DO NOT sleep with pets once you start using the CHG.   CHG Shower Instructions:  If you choose to wash your hair and private area, wash first with your normal shampoo/soap.  After you use shampoo/soap, rinse your hair and body thoroughly to remove shampoo/soap residue.  Turn the water  OFF and apply about 3 tablespoons  (45 ml) of CHG soap to a CLEAN washcloth.  Apply CHG soap ONLY FROM YOUR NECK DOWN TO YOUR TOES (washing for 3-5 minutes)  DO NOT use CHG soap on face, private areas, open wounds, or sores.  Pay special attention to the area where your surgery is being performed.  If you are having back surgery, having someone wash your back for you may be helpful. Wait 2 minutes after CHG soap is applied, then you may rinse off the CHG soap.  Pat dry with a clean towel  Put on clean clothes/pajamas   If you choose to wear lotion, please use ONLY the CHG-compatible lotions on the back of this paper.     Additional instructions for the day of surgery: DO NOT APPLY any lotions, deodorants, cologne, or perfumes.   Put on clean/comfortable clothes.  Brush your teeth.  Ask your nurse before applying  any prescription medications to the skin.      CHG Compatible Lotions   Aveeno Moisturizing lotion  Cetaphil Moisturizing Cream  Cetaphil Moisturizing Lotion  Clairol Herbal Essence Moisturizing Lotion, Dry Skin  Clairol Herbal Essence Moisturizing Lotion, Extra Dry Skin  Clairol Herbal Essence Moisturizing Lotion, Normal Skin  Curel Age Defying Therapeutic Moisturizing Lotion with Alpha Hydroxy  Curel Extreme Care Body Lotion  Curel Soothing Hands Moisturizing Hand Lotion  Curel Therapeutic Moisturizing Cream, Fragrance-Free  Curel Therapeutic Moisturizing Lotion, Fragrance-Free  Curel Therapeutic Moisturizing Lotion, Original Formula  Eucerin Daily Replenishing Lotion  Eucerin Dry Skin Therapy Plus Alpha Hydroxy Crme  Eucerin Dry Skin Therapy Plus Alpha Hydroxy Lotion  Eucerin Original Crme  Eucerin Original Lotion  Eucerin Plus Crme Eucerin Plus Lotion  Eucerin TriLipid Replenishing Lotion  Keri Anti-Bacterial Hand Lotion  Keri Deep Conditioning Original Lotion Dry Skin Formula Softly Scented  Keri Deep Conditioning Original Lotion, Fragrance Free Sensitive Skin Formula  Keri Lotion Fast  Absorbing Fragrance Free Sensitive Skin Formula  Keri Lotion Fast Absorbing Softly Scented Dry Skin Formula  Keri Original Lotion  Keri Skin Renewal Lotion Keri Silky Smooth Lotion  Keri Silky Smooth Sensitive Skin Lotion  Nivea Body Creamy Conditioning Oil  Nivea Body Extra Enriched Teacher, adult education Moisturizing Lotion Nivea Crme  Nivea Skin Firming Lotion  NutraDerm 30 Skin Lotion  NutraDerm Skin Lotion  NutraDerm Therapeutic Skin Cream  NutraDerm Therapeutic Skin Lotion  ProShield Protective Hand Cream  Provon moisturizing lotion

## 2024-04-06 ENCOUNTER — Other Ambulatory Visit: Payer: Self-pay | Admitting: Neurosurgery

## 2024-04-06 MED ORDER — GABAPENTIN 100 MG PO CAPS: 100.0000 mg | ORAL_CAPSULE | Freq: Three times a day (TID) | ORAL | 0 refills | Status: DC

## 2024-04-13 ENCOUNTER — Encounter: Payer: Self-pay | Admitting: Neurosurgery

## 2024-04-13 NOTE — Progress Notes (Signed)
 Perioperative / Anesthesia Services  Pre-Admission Testing Clinical Review / Pre-Operative Anesthesia Consult  Date: 04/13/24  PATIENT DEMOGRAPHICS: Name: Lydia Elliott DOB: [birth year] MRN:   [id number]  Note: Available PAT nursing documentation and vital signs have been reviewed. Clinical nursing staff has updated patient's PMH/PSHx, current medication list, and drug allergies/intolerances to ensure complete and comprehensive history available to assist care teams in MDM as it pertains to the aforementioned surgical procedure and anticipated anesthetic course. Extensive review of available clinical information personally performed. [place] PMH and PSHx updated with any diagnoses/procedures that  may have been inadvertently omitted during her intake with the pre-admission testing department's nursing staff.  PLANNED SURGICAL PROCEDURE(S):   Case: [id number] Date/Time: 04/15/24 1237   Procedure: Left side, lumbar laminectomy, medial facetectomy, foraminotomies.  L4-S1. 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   Location: [facility] OR ROOM 03 / [facility]   Surgeons: Claudene Penne ORN, MD        CLINICAL DISCUSSION: Lydia Elliott is a 73 y.o. female who is submitted for pre-surgical anesthesia review and clearance prior to her undergoing the above procedure. Patient is a Former Smoker (0.5 pack years; quit 08/1970). 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    Patient is followed by cardiology Juanice, MD). She was last seen in  the cardiology clinic on 12/19/2023; notes reviewed. At the time of her clinic visit, patient doing well overall from a cardiovascular perspective.  Patient complains of some mild shortness of breath with exertion.  She reports that her oxygen levels decrease to the lower 90s.  Patient denied any chest pain, PND, orthopnea, palpitations, significant peripheral edema, weakness, fatigue, vertiginous symptoms, or presyncope/syncope. Patient with a past medical history significant for cardiovascular diagnoses. Documented physical exam was grossly benign, providing no evidence of acute exacerbation and/or decompensation of the patient's known cardiovascular conditions.  In the setting of acute stress, patient was diagnosed with Takotsubo cardiomyopathy in 2010.  Patient presented like atypical myocardial infarction.  She underwent diagnostic LEFT heart catheterization and is reported to have a cardiac stent x1; type and location of stent unknown at time of consult.   Myocardial perfusion imaging study performed on 02/27/2016 revealed normal left ventricular systolic function with a hyperdynamic EF of 72%. There were no regional wall motion abnormalities.  Exercise tolerance was fair.  There was no evidence of stress-induced myocardial ischemia or arrhythmia.  TID ratio = 0.98. Study determined to be low risk.  TTE performed on 04/04/2020 revealed normal left ventricular systolic function with mild LVH; LVEF >55%.  There was trivial MR, in addition to mild AR and TR; no AR.  There is no evidence of a significant transvalvular gradient to suggest valvular stenosis.  Stress echocardiogram was performed on 04/17/2023 revealing normal left ventricular systolic function with an EF of >55%.  There were no regional wall motion abnormalities.  Right ventricular size and function normal.  No significant valvular regurgitation or stenosis was noted.  Patient was stressed for a total of 5 minutes and 48 seconds, during which  she achieved a maximal heart rate of 122 bpm and 4.2 METS.  Blood  pressure well controlled at 124/62 mmHg on currently prescribed beta-blocker (carvedilol ) and ARB (losartan ) therapies.  Patient is on atorvastatin  for her HLD diagnosis and ASCVD prevention. Patient is not diabetic. She does not have an OSAH diagnosis.  Functional capacity limited by patient's orthopedic pain in her knee and arm.  Patient normally a swimmer, however over the course of the last 8 months, physical activity has been limited.  She has undergone imaging, or splint, and received cortisone injections, all of which have improved her pain and let her resume some of her activities.  Patient with some episodes of shortness of breath.  With all that said, patient is able to complete all her ADLs/IADLs without significant cardiovascular limitation.  Per the [facility], patient is able to exceed 4 METS of physical activity without experiencing any significant degrees of angina/anginal equivalent symptoms.  No changes were made to her medication regimen during her visit with cardiology.  Patient scheduled to follow-up with outpatient cardiology in 6 months or sooner if needed.  Lydia Elliott is scheduled for an elective LEFT SIDE, LUMBAR LAMINECTOMY, MEDIAL FACETECTOMY, FORAMINOTOMIES L4-S1 (LEFT) on 04/15/2024 with Dr. Penne LELON Sharps, MD. Given patient's past medical history significant for cardiovascular diagnoses, presurgical cardiac clearance was sought by the PAT team. Per cardiology, this patient is optimized for surgery and may proceed with the planned procedural course with a LOW risk of significant perioperative cardiovascular complications.  In review of her medication reconciliation, the patient is not noted to be taking any type of anticoagulation or antiplatelet therapies that would need to be held during her perioperative course.  Patient denies previous perioperative complications with anesthesia in the past. In review her EMR,  it is noted that patient underwent a general anesthetic course here at [facility] ([facility]) in 11/2022 without documented complications.   MOST RECENT VITAL SIGNS:    04/02/2024   10:39 AM 02/09/2024   10:51 AM 11/24/2023    9:55 AM  Vitals with BMI  Height 5' 2 5' 2 5' 1  Weight 149 lbs 10 oz 151 lbs 8 oz 151 lbs  BMI 27.36 27.7 28.55  Systolic 148 132 857  Diastolic 71 52 72  Pulse 73     PROVIDERS/SPECIALISTS: NOTE: Primary physician provider listed below. Patient may have been seen by APP or partner within same practice.   PROVIDER ROLE / SPECIALTY LAST OV  Sharps Penne LELON, MD Neurosurgery (Surgeon) 03/11/2024  Epifanio Alm SQUIBB, MD Primary Care Provider 02/03/2024  Dewane Shiner, DO Cardiology 12/19/2023  Tobie Solo, MD Rheumatology 02/09/2024  Avanell Katz, MD Physiatry 02/04/2024   ALLERGIES: Allergies  Allergen Reactions   Rosuvastatin     Muscle Pain    CURRENT HOME MEDICATIONS: No current facility-administered medications for this encounter.    alendronate (FOSAMAX) 70 MG tablet   atorvastatin  (LIPITOR) 20 MG tablet   busPIRone  (BUSPAR ) 5 MG tablet   carvedilol  (COREG ) 3.125 MG tablet   diclofenac Sodium (VOLTAREN) 1 % GEL   escitalopram  (LEXAPRO ) 20 MG tablet   gabapentin  (NEURONTIN ) 100 MG capsule   losartan  (COZAAR ) 25 MG tablet   omeprazole (PRILOSEC) 20 MG capsule   zolpidem  (AMBIEN ) 10 MG tablet   acetaminophen  (TYLENOL ) 500 MG tablet   adalimumab  (HUMIRA ) 40 MG/0.4ML pen   Ascorbic Acid  (VITAMIN C ) 1000 MG tablet   Cholecalciferol  125 MCG (5000 UT) TABS   HISTORY: Past Medical History:  Diagnosis Date   Anemia    Anxiety    Aortic  atherosclerosis (HCC)    Arthritis    Bone island 10/15/2016   a.) RIGHT humeral head   CAD (coronary artery disease)    a.) 2010 --> PCI with stent placement x 1 (type and location unknown)   Cataract    Closed fracture of left proximal humerus 11/14/2022    Depression    GERD (gastroesophageal reflux disease)    Heart disease    Hyperlipidemia    Hypertension    IBS (irritable bowel syndrome)    Insomnia    a.) on hypnotic PRN (zolpidem )   Lumbar radiculopathy 02/09/2024   Nodule of right lung 10/18/2016   Osteoporosis 11/15/2022   Polyarthralgia    Psoriatic arthritis (HCC)    Septic joint of right knee joint (HCC) 12/16/2021   Takotsubo cardiomyopathy    Thalassemia    Vertigo    Vitamin D  deficiency 11/15/2022   Wears dentures    partial lower   Past Surgical History:  Procedure Laterality Date   APPENDECTOMY  08/12/1965   CARPAL TUNNEL RELEASE  08/12/2005   CATARACT EXTRACTION  08/13/2011   CHOLECYSTECTOMY  08/12/1998   COLONOSCOPY  03/11/2014   Dr.Wohl   COLONOSCOPY WITH PROPOFOL  N/A 12/11/2015   Procedure: COLONOSCOPY WITH PROPOFOL ;  Surgeon: Rogelia Copping, MD;  Location: [facility];  Service: Endoscopy;  Laterality: N/A;  DESCENDING COLON POLYP    CORONARY ANGIOPLASTY WITH STENT PLACEMENT  08/12/2008   ESOPHAGOGASTRODUODENOSCOPY  02/21/2014   Dr.Wohl   ESOPHAGOGASTRODUODENOSCOPY (EGD) WITH PROPOFOL  N/A 12/11/2015   Procedure: ESOPHAGOGASTRODUODENOSCOPY (EGD) WITH PROPOFOL ;  Surgeon: Rogelia Copping, MD;  Location: [facility];  Service: Endoscopy;  Laterality: N/A;   INCISION AND DRAINAGE Right 12/17/2021   Procedure: INCISION AND DRAINAGE;  Surgeon: Marchia Drivers, MD;  Location: [facility] ORS;  Service: Orthopedics;  Laterality: Right;   KNEE ARTHROSCOPY Right 10/04/2021   Procedure: ARTHROSCOPY KNEE;  Surgeon: Marchia Drivers, MD;  Location: [facility] ORS;  Service: Orthopedics;  Laterality: Right;   KNEE ARTHROSCOPY Right 10/30/2021   Procedure: ARTHROSCOPY KNEE, I&D OF SUPERFICIAL ABSCESS;  Surgeon: Marchia Drivers, MD;  Location: [facility] ORS;  Service: Orthopedics;  Laterality: Right;   ORIF HUMERUS FRACTURE Left 11/14/2022   Procedure: OPEN REDUCTION INTERNAL FIXATION (ORIF) PROXIMAL HUMERUS FRACTURE;  Surgeon:  Celena Sharper, MD;  Location: [facility] OR;  Service: Orthopedics;  Laterality: Left;   POLYPECTOMY N/A 12/11/2015   Procedure: POLYPECTOMY;  Surgeon: Rogelia Copping, MD;  Location: [facility];  Service: Endoscopy;  Laterality: N/A;   TONSILLECTOMY     TONSILLECTOMY AND ADENOIDECTOMY  08/13/1967   Family History  Problem Relation Age of Onset   Cirrhosis Mother    Hepatitis Mother    Thalassemia Mother    Diabetes Mother    Heart disease Father    Diabetes Father    Hyperlipidemia Father    Hypertension Father    Stroke Father    Cancer Sister        lung   Diabetes Sister    Stroke Sister        during a surgery   Diabetes Brother    Heart disease Brother    Hypertension Brother    Stroke Brother    Thalassemia Maternal Grandmother    COPD Neg Hx    Social History   Tobacco Use   Smoking status: Former    Current packs/day: 0.00    Average packs/day: 0.5 packs/day for 1 year (0.5 ttl pk-yrs)    Types: Cigarettes    Start date: 3  Quit date: 56    Years since quitting: 53.7   Smokeless tobacco: Never   Tobacco comments:    smoked occasionally  Substance Use Topics   Alcohol use: Yes    Alcohol/week: 1.0 standard drink of alcohol    Types: 1 Shots of liquor per week    Comment: socially   LABS:  Hospital Outpatient Visit on 04/02/2024  Component Date Value Ref Range Status   MRSA, PCR 04/02/2024 NEGATIVE  NEGATIVE Final   Staphylococcus aureus 04/02/2024 NEGATIVE  NEGATIVE Final   Comment: (NOTE) The Xpert SA Assay (FDA approved for NASAL specimens in patients 54 years of age and older), is one component of a comprehensive surveillance program. It is not intended to diagnose infection nor to guide or monitor treatment. Performed at [facility], [address] [place], [place] [address]    Sodium 04/02/2024 138  135 - 145 mmol/L Final   Potassium 04/02/2024 4.3  3.5 - 5.1 mmol/L Final   Chloride 04/02/2024 105  98 - 111 mmol/L Final    CO2 04/02/2024 24  22 - 32 mmol/L Final   Glucose, Bld 04/02/2024 109 (H)  70 - 99 mg/dL Final   Glucose reference range applies only to samples taken after fasting for at least 8 hours.   BUN 04/02/2024 17  8 - 23 mg/dL Final   Creatinine, Ser 04/02/2024 0.71  0.44 - 1.00 mg/dL Final   Calcium  04/02/2024 8.9  8.9 - 10.3 mg/dL Final   GFR, Estimated 04/02/2024 >60  >60 mL/min Final   Comment: (NOTE) Calculated using the CKD-EPI Creatinine Equation (2021)    Anion gap 04/02/2024 9  5 - 15 Final   Performed at [facility], [address] [place], [place] [address]   WBC 04/02/2024 7.2  4.0 - 10.5 K/uL Final   RBC 04/02/2024 5.34 (H)  3.87 - 5.11 MIL/uL Final   Hemoglobin 04/02/2024 10.9 (L)  12.0 - 15.0 g/dL Final   HCT 91/77/7974 36.2  36.0 - 46.0 % Final   MCV 04/02/2024 67.8 (L)  80.0 - 100.0 fL Final   MCH 04/02/2024 20.4 (L)  26.0 - 34.0 pg Final   MCHC 04/02/2024 30.1  30.0 - 36.0 g/dL Final   RDW 91/77/7974 15.6 (H)  11.5 - 15.5 % Final   Platelets 04/02/2024 220  150 - 400 K/uL Final   REPEATED TO VERIFY   nRBC 04/02/2024 0.0  0.0 - 0.2 % Final   Performed at [facility], [address] [place], [place] [address]    ECG: Date: 04/02/2024  Time ECG obtained: 1133 AM Rate: 67 bpm Rhythm: normal sinus Axis (leads I and aVF): right Intervals:  QRS 102 ms. QTc 429 ms. ST segment and T wave changes: No evidence of acute T wave abnormalities or significant ST segment elevation or depression.  Evidence of a possible, age undetermined, prior infarct:  No Comparison: Similar to previous tracing obtained on 08/06/2023   IMAGING / PROCEDURES: DG LUMBAR SPINE COMPLETE performed on 11/24/2023 Osseous structures are osteopenic.  No fracture, dislocation or subluxation.  Mild grade 1 L5 retrolisthesis.  No osteolytic or osteoblastic changes.  No motion on flexion and extension to suggest instability.  Aortoiliac atheromatous calcifications. Degenerative  disc disease noted with disc space narrowing and marginal osteophytes at L2-3  MR LUMBAR SPINE WO CONTRAST performed on 07/18/2023 Multilevel degenerative changes, most notably at L5-S1 where there is an eccentric left disc bulge that severely narrows the left lateral recess  with mass effect on the descending left S1 nerve root. This disc bulge likely also contacts the exited left L5 nerve root in the foraminal/extraforaminal zone. Eccentric left disc bulge at L4-L5 likely contacts the exited left L4 nerve root in the extraforaminal zone. Left parcentral disc extrusion at L1-L2 with narrowing of the left lateral recess. Additional degenerative changes, as above.  CT CERVICAL SPINE WO CONTRAST performed on 11/06/2022 Mild anterolisthesis of C4 on C5, likely degenerative given facet arthropathy at this level. Vertebral body heights are maintained. No evidence of acute fracture. No prevertebral fluid or swelling. No visible canal hematoma. Multilevel facet and uncovertebral hypertrophy with varying degrees of neural foraminal stenosis. Multilevel degenerative disc disease, greatest at C5-C6 where there is disc bulging and endplate spurring. Visualized lung apices are clear.   TRANSTHORACIC ECHOCARDIOGRAM performed on 04/04/2020 LVEF >55% Normal left ventricular systolic function with mild LVH Right ventricular systolic function Trivial MR Mild AR and TR No PR No valvular stenosis No evidence of pericardial effusion   MYOCARDIAL PERFUSION IMAGING STUDY (LEXISCAN) performed on 02/27/2016 LVEF 72% Normal myocardial thickening motion No artifact. Left ventricular cavity size normal No evidence of stress-induced myocardial ischemia or arrhythmia Study determined to be normal and low risk   IMPRESSION AND PLAN: Lydia Elliott has been referred for pre-anesthesia review and clearance prior to her undergoing the planned anesthetic and procedural courses. Available labs, pertinent testing, and  imaging results were personally reviewed by me in preparation for upcoming operative/procedural course. [facility] medical record has been updated following extensive record review and patient interview with PAT staff.   This patient has been appropriately cleared by cardiology with an overall LOW risk of patient experiencing significant perioperative cardiovascular complications. Based on clinical review performed today (04/13/24), barring any significant acute changes in the patient's overall condition, it is anticipated that she will be able to proceed with the planned surgical intervention. Any acute changes in clinical condition may necessitate her procedure being postponed and/or cancelled. Patient will meet with anesthesia team (MD and/or CRNA) on the day of her procedure for preoperative evaluation/assessment. Questions regarding anesthetic course will be fielded at that time.   Pre-surgical instructions were reviewed with the patient during his PAT appointment, and questions were fielded to satisfaction by PAT clinical staff. She has been instructed on which medications that she will need to hold prior to surgery, as well as the ones that have been deemed safe/appropriate to take on the day of her procedure. As part of the general education provided by PAT, patient made aware both verbally and in writing, that she would need to abstain from the use of any illegal substances during her perioperative course. She was advised that failure to follow the provided instructions could necessitate case cancellation or result in serious perioperative complications up to and including death. Patient encouraged to contact PAT and/or her surgeon's office to discuss any questions or concerns that may arise prior to surgery; verbalized understanding.   Lydia Pereyra, MSN, APRN, FNP-C, CEN [facility]  Perioperative Services Nurse Practitioner Phone: [phone] Fax: [phone] 04/13/24  1:48 PM  NOTE: This note has been prepared using Dragon dictation software. Despite my best ability to proofread, there is always the potential that unintentional transcriptional errors may still occur from this process.

## 2024-04-15 ENCOUNTER — Ambulatory Visit: Payer: Self-pay | Admitting: Urgent Care

## 2024-04-15 ENCOUNTER — Ambulatory Visit

## 2024-04-15 ENCOUNTER — Other Ambulatory Visit: Payer: Self-pay

## 2024-04-15 ENCOUNTER — Encounter
Admission: RE | Disposition: A | Discharge status: Home / Self Care | Payer: Self-pay | Source: Home / Self Care | Attending: Neurosurgery

## 2024-04-15 ENCOUNTER — Ambulatory Visit
Admission: RE | Admit: 2024-04-15 | Discharge: 2024-04-15 | Disposition: A | Discharge status: Home / Self Care | Attending: Neurosurgery | Admitting: Neurosurgery

## 2024-04-15 DIAGNOSIS — M5416 Radiculopathy, lumbar region: Secondary | ICD-10-CM | POA: Insufficient documentation

## 2024-04-15 DIAGNOSIS — Z87891 Personal history of nicotine dependence: Secondary | ICD-10-CM | POA: Insufficient documentation

## 2024-04-15 DIAGNOSIS — K219 Gastro-esophageal reflux disease without esophagitis: Secondary | ICD-10-CM | POA: Insufficient documentation

## 2024-04-15 DIAGNOSIS — M48062 Spinal stenosis, lumbar region with neurogenic claudication: Secondary | ICD-10-CM | POA: Diagnosis not present

## 2024-04-15 DIAGNOSIS — I2510 Atherosclerotic heart disease of native coronary artery without angina pectoris: Secondary | ICD-10-CM | POA: Insufficient documentation

## 2024-04-15 DIAGNOSIS — I10 Essential (primary) hypertension: Secondary | ICD-10-CM | POA: Diagnosis not present

## 2024-04-15 DIAGNOSIS — Z0189 Encounter for other specified special examinations: Secondary | ICD-10-CM | POA: Diagnosis not present

## 2024-04-15 DIAGNOSIS — Z01818 Encounter for other preprocedural examination: Secondary | ICD-10-CM

## 2024-04-15 HISTORY — PX: DECOMPRESSIVE LUMBAR LAMINECTOMY LEVEL 2: SHX5792

## 2024-04-15 HISTORY — DX: Spinal stenosis, lumbar region with neurogenic claudication: M48.062

## 2024-04-15 HISTORY — DX: Long term (current) use of immunosuppressive biologic: Z79.620

## 2024-04-15 SURGERY — DECOMPRESSIVE LUMBAR LAMINECTOMY LEVEL 2
Anesthesia: General | Laterality: Left

## 2024-04-15 MED ORDER — OXYCODONE HCL 5 MG PO TABS
5.0000 mg | ORAL_TABLET | ORAL | 0 refills | Status: DC | PRN
  Filled 2024-04-15: qty 30, 5d supply, fill #0

## 2024-04-15 MED ORDER — SODIUM CHLORIDE (PF) 0.9 % IJ SOLN
INTRAMUSCULAR | Status: AC
  Filled 2024-04-15: qty 10

## 2024-04-15 MED ORDER — ROCURONIUM BROMIDE 100 MG/10ML IV SOLN
INTRAVENOUS | Status: DC | PRN
  Administered 2024-04-15: 40 mg via INTRAVENOUS

## 2024-04-15 MED ORDER — DROPERIDOL 2.5 MG/ML IJ SOLN
INTRAMUSCULAR | Status: AC
  Filled 2024-04-15: qty 2

## 2024-04-15 MED ORDER — ONDANSETRON HCL 4 MG/2ML IJ SOLN
INTRAMUSCULAR | Status: AC
  Filled 2024-04-15: qty 2

## 2024-04-15 MED ORDER — FENTANYL CITRATE (PF) 100 MCG/2ML IJ SOLN
INTRAMUSCULAR | Status: DC | PRN
  Administered 2024-04-15 (×2): 50 ug via INTRAVENOUS

## 2024-04-15 MED ORDER — SUGAMMADEX SODIUM 200 MG/2ML IV SOLN
INTRAVENOUS | Status: DC | PRN
  Administered 2024-04-15: 200 mg via INTRAVENOUS

## 2024-04-15 MED ORDER — LACTATED RINGERS IV SOLN: INTRAVENOUS | Status: DC

## 2024-04-15 MED ORDER — PROPOFOL 10 MG/ML IV BOLUS
INTRAVENOUS | Status: DC | PRN
  Administered 2024-04-15: 130 mg via INTRAVENOUS

## 2024-04-15 MED ORDER — LIDOCAINE HCL (PF) 2 % IJ SOLN
INTRAMUSCULAR | Status: AC
Start: 2024-04-15 — End: 2024-04-15
  Filled 2024-04-15: qty 5

## 2024-04-15 MED ORDER — PROPOFOL 10 MG/ML IV BOLUS
INTRAVENOUS | Status: AC
  Filled 2024-04-15: qty 20

## 2024-04-15 MED ORDER — SUCCINYLCHOLINE CHLORIDE 200 MG/10ML IV SOSY
PREFILLED_SYRINGE | INTRAVENOUS | Status: DC | PRN
  Administered 2024-04-15: 100 mg via INTRAVENOUS

## 2024-04-15 MED ORDER — MIDAZOLAM HCL 2 MG/2ML IJ SOLN
INTRAMUSCULAR | Status: AC
Start: 2024-04-15 — End: 2024-04-15
  Filled 2024-04-15: qty 2

## 2024-04-15 MED ORDER — DROPERIDOL 2.5 MG/ML IJ SOLN
0.6250 mg | Freq: Once | INTRAMUSCULAR | Status: AC
  Administered 2024-04-15: 0.625 mg via INTRAVENOUS

## 2024-04-15 MED ORDER — HYDROMORPHONE HCL 1 MG/ML IJ SOLN
INTRAMUSCULAR | Status: DC | PRN
  Administered 2024-04-15: .5 mg via INTRAVENOUS

## 2024-04-15 MED ORDER — DEXAMETHASONE SODIUM PHOSPHATE 10 MG/ML IJ SOLN
INTRAMUSCULAR | Status: DC | PRN
  Administered 2024-04-15: 10 mg via INTRAVENOUS

## 2024-04-15 MED ORDER — FENTANYL CITRATE (PF) 100 MCG/2ML IJ SOLN: 25.0000 ug | INTRAMUSCULAR | Status: DC | PRN

## 2024-04-15 MED ORDER — HYDROMORPHONE HCL 1 MG/ML IJ SOLN
INTRAMUSCULAR | Status: AC
  Filled 2024-04-15: qty 1

## 2024-04-15 MED ORDER — SURGIFLO WITH THROMBIN (HEMOSTATIC MATRIX KIT) OPTIME
TOPICAL | Status: DC | PRN
  Administered 2024-04-15: 1 via TOPICAL

## 2024-04-15 MED ORDER — EPHEDRINE 5 MG/ML INJ
INTRAVENOUS | Status: AC
  Filled 2024-04-15: qty 5

## 2024-04-15 MED ORDER — 0.9 % SODIUM CHLORIDE (POUR BTL) OPTIME
TOPICAL | Status: DC | PRN
  Administered 2024-04-15: 500 mL

## 2024-04-15 MED ORDER — CEFAZOLIN SODIUM-DEXTROSE 2-4 GM/100ML-% IV SOLN
INTRAVENOUS | Status: AC
Start: 2024-04-15 — End: 2024-04-15
  Filled 2024-04-15: qty 100

## 2024-04-15 MED ORDER — OXYCODONE HCL 5 MG/5ML PO SOLN: 5.0000 mg | Freq: Once | ORAL | Status: DC | PRN

## 2024-04-15 MED ORDER — CHLORHEXIDINE GLUCONATE 0.12 % MT SOLN
15.0000 mL | Freq: Once | OROMUCOSAL | Status: AC
  Administered 2024-04-15: 15 mL via OROMUCOSAL

## 2024-04-15 MED ORDER — SUCCINYLCHOLINE CHLORIDE 200 MG/10ML IV SOSY
PREFILLED_SYRINGE | INTRAVENOUS | Status: AC
  Filled 2024-04-15: qty 10

## 2024-04-15 MED ORDER — BUPIVACAINE-EPINEPHRINE (PF) 0.5% -1:200000 IJ SOLN
INTRAMUSCULAR | Status: DC | PRN
  Administered 2024-04-15: 10 mL

## 2024-04-15 MED ORDER — OXYCODONE HCL 5 MG PO TABS: 5.0000 mg | ORAL_TABLET | Freq: Once | ORAL | Status: DC | PRN

## 2024-04-15 MED ORDER — BUPIVACAINE LIPOSOME 1.3 % IJ SUSP
INTRAMUSCULAR | Status: AC
  Filled 2024-04-15: qty 10

## 2024-04-15 MED ORDER — CEFAZOLIN IN SODIUM CHLORIDE 2-0.9 GM/100ML-% IV SOLN
2.0000 g | Freq: Once | INTRAVENOUS | Status: AC
  Administered 2024-04-15: 2 g via INTRAVENOUS

## 2024-04-15 MED ORDER — METHOCARBAMOL 500 MG PO TABS
500.0000 mg | ORAL_TABLET | Freq: Three times a day (TID) | ORAL | 1 refills | Status: AC | PRN
  Filled 2024-04-15: qty 120, 40d supply, fill #0

## 2024-04-15 MED ORDER — DEXAMETHASONE SODIUM PHOSPHATE 10 MG/ML IJ SOLN
INTRAMUSCULAR | Status: AC
  Filled 2024-04-15: qty 1

## 2024-04-15 MED ORDER — LIDOCAINE HCL (CARDIAC) PF 100 MG/5ML IV SOSY
PREFILLED_SYRINGE | INTRAVENOUS | Status: DC | PRN
  Administered 2024-04-15: 80 mg via INTRAVENOUS

## 2024-04-15 MED ORDER — ACETAMINOPHEN 500 MG PO TABS
1000.0000 mg | ORAL_TABLET | Freq: Three times a day (TID) | ORAL | 0 refills | Status: DC | PRN
  Filled 2024-04-15: qty 30, 5d supply, fill #0

## 2024-04-15 MED ORDER — FENTANYL CITRATE (PF) 100 MCG/2ML IJ SOLN
INTRAMUSCULAR | Status: AC
  Filled 2024-04-15: qty 2

## 2024-04-15 MED ORDER — ORAL CARE MOUTH RINSE: 15.0000 mL | Freq: Once | OROMUCOSAL | Status: AC

## 2024-04-15 MED ORDER — EPHEDRINE SULFATE-NACL 50-0.9 MG/10ML-% IV SOSY
PREFILLED_SYRINGE | INTRAVENOUS | Status: DC | PRN
  Administered 2024-04-15: 5 mg via INTRAVENOUS
  Administered 2024-04-15: 10 mg via INTRAVENOUS
  Administered 2024-04-15: 5 mg via INTRAVENOUS

## 2024-04-15 MED ORDER — ROCURONIUM BROMIDE 10 MG/ML (PF) SYRINGE
PREFILLED_SYRINGE | INTRAVENOUS | Status: AC
  Filled 2024-04-15: qty 10

## 2024-04-15 MED ORDER — BUPIVACAINE HCL (PF) 0.5 % IJ SOLN
INTRAMUSCULAR | Status: DC | PRN
  Administered 2024-04-15: 10 mL

## 2024-04-15 MED ORDER — CHLORHEXIDINE GLUCONATE 0.12 % MT SOLN
OROMUCOSAL | Status: AC
  Filled 2024-04-15: qty 15

## 2024-04-15 MED ORDER — ACETAMINOPHEN 10 MG/ML IV SOLN
INTRAVENOUS | Status: DC | PRN
  Administered 2024-04-15: 1000 mg via INTRAVENOUS

## 2024-04-15 MED ORDER — METHYLPREDNISOLONE ACETATE 40 MG/ML IJ SUSP
INTRAMUSCULAR | Status: AC
  Filled 2024-04-15: qty 1

## 2024-04-15 MED ORDER — ONDANSETRON HCL 4 MG/2ML IJ SOLN
INTRAMUSCULAR | Status: DC | PRN
  Administered 2024-04-15: 4 mg via INTRAVENOUS

## 2024-04-15 MED ORDER — SODIUM CHLORIDE 0.9 % IV SOLN
INTRAVENOUS | Status: DC | PRN
  Administered 2024-04-15: 20 mL

## 2024-04-15 MED ORDER — MIDAZOLAM HCL 2 MG/2ML IJ SOLN
INTRAMUSCULAR | Status: DC | PRN
  Administered 2024-04-15: 2 mg via INTRAVENOUS

## 2024-04-15 MED ORDER — BUPIVACAINE HCL (PF) 0.5 % IJ SOLN
INTRAMUSCULAR | Status: AC
  Filled 2024-04-15: qty 30

## 2024-04-15 MED ORDER — SENNA 8.6 MG PO TABS
1.0000 | ORAL_TABLET | Freq: Every day | ORAL | 0 refills | Status: DC
  Filled 2024-04-15: qty 120, 120d supply, fill #0

## 2024-04-15 MED ORDER — ACETAMINOPHEN 10 MG/ML IV SOLN
INTRAVENOUS | Status: AC
  Filled 2024-04-15: qty 100

## 2024-04-15 SURGICAL SUPPLY — 30 items
BASIN KIT SINGLE STR (MISCELLANEOUS) ×1 IMPLANT
BRUSH SCRUB EZ 4% CHG (MISCELLANEOUS) ×1 IMPLANT
BUR NEURO DRILL SOFT 3.0X3.8M (BURR) ×1 IMPLANT
DERMABOND ADVANCED .7 DNX12 (GAUZE/BANDAGES/DRESSINGS) ×1 IMPLANT
DRAPE C-ARM XRAY 36X54 (DRAPES) ×2 IMPLANT
DRAPE LAPAROTOMY 100X77 ABD (DRAPES) ×1 IMPLANT
DRAPE MICROSCOPE SPINE 48X150 (DRAPES) ×1 IMPLANT
DRSG OPSITE POSTOP 3X4 (GAUZE/BANDAGES/DRESSINGS) ×1 IMPLANT
DRSG TEGADERM 4X4.75 (GAUZE/BANDAGES/DRESSINGS) IMPLANT
ELECTRODE EZSTD 165MM 6.5IN (MISCELLANEOUS) ×1 IMPLANT
ELECTRODE REM PT RTRN 9FT ADLT (ELECTROSURGICAL) ×1 IMPLANT
GLOVE BIOGEL PI IND STRL 7.0 (GLOVE) ×1 IMPLANT
GLOVE BIOGEL PI IND STRL 8 (GLOVE) ×2 IMPLANT
GLOVE SURG SYN 7.0 PF PI (GLOVE) ×1 IMPLANT
GLOVE SURG SYN 7.5 PF PI (GLOVE) ×1 IMPLANT
GOWN SRG XL LVL 3 NONREINFORCE (GOWNS) ×1 IMPLANT
GOWN STRL REUS W/ TWL LRG LVL3 (GOWN DISPOSABLE) ×1 IMPLANT
KIT WILSON FRAME (KITS) ×1 IMPLANT
KNIFE BAYONET SHORT DISCETOMY (MISCELLANEOUS) IMPLANT
NDL SAFETY ECLIPSE 18X1.5 (NEEDLE) ×1 IMPLANT
NS IRRIG 500ML POUR BTL (IV SOLUTION) ×1 IMPLANT
PACK LAMINECTOMY ARMC (PACKS) ×1 IMPLANT
PAD ARMBOARD POSITIONER FOAM (MISCELLANEOUS) ×2 IMPLANT
SURGIFLO W/THROMBIN 8M KIT (HEMOSTASIS) ×1 IMPLANT
SUT STRATA 3-0 15 PS-2 (SUTURE) ×1 IMPLANT
SUT VIC AB 0 CT1 27XCR 8 STRN (SUTURE) ×1 IMPLANT
SUT VIC AB 2-0 CT1 18 (SUTURE) ×1 IMPLANT
SYR 30ML LL (SYRINGE) ×2 IMPLANT
SYR 3ML LL SCALE MARK (SYRINGE) ×1 IMPLANT
TRAP FLUID SMOKE EVACUATOR (MISCELLANEOUS) ×1 IMPLANT

## 2024-04-15 NOTE — H&P (Signed)
 Referring Physician:  No referring provider defined for this encounter.  Primary Physician:  Lydia Alm SQUIBB, MD  History of Present Illness: 04/15/2024 [name] is here today with a chief complaint of back pain and claudication she has a history of of low back pain and radiculopathy.  She has been getting injections.  This is mostly been left-sided.  We discussed surgical intervention at her last visit but she wanted to try conservative care to evaluate whether or not she would be able to get significant recovery.  She just recently had her second injection.  She has not yet noticed any changes, however it is still quite early in her recovery.  She does feel like this continues to nag and she does intermittently notice the weakness including some foot drag.  Conservative measures:  Physical therapy:  has participated in [facility] at [facility] Multimodal medical therapy including regular antiinflammatories:  gabapentin , Hydrocodone , Prednisone , tramadol , Tylenol  Injections: 10/17/2023, 08/20/2023 Left S1 epidural steroid injections, repeat injections last week  Past Surgery: none  The symptoms are causing a significant impact on the patient's life.   I have utilized the care everywhere function in epic to review the outside records available from external health systems.  Review of Systems:  A 10 point review of systems is negative, except for the pertinent positives and negatives detailed in the HPI.  Past Medical History: Past Medical History:  Diagnosis Date   Anemia    Anxiety    Aortic atherosclerosis (HCC)    Arthritis    Bone island 10/15/2016   a.) RIGHT humeral head   CAD (coronary artery disease)    a.) 2010 --> PCI with stent placement x 1 (type and location unknown)   Cataract    Depression    GERD (gastroesophageal reflux disease)    Hyperlipidemia    Hypertension    IBS (irritable bowel syndrome)    Insomnia    a.) on hypnotic PRN (zolpidem )   Long-term  current use of immunosuppressive biologic agent    a.) on TNF blocker (adalimumab ) for psoriatic arthritis   Nodule of right lung 10/18/2016   Osteoporosis 11/15/2022   Psoriatic arthritis (HCC)    a. Tx'd with adalimumab    Septic joint of right knee joint (HCC) 12/16/2021   Spinal stenosis of lumbar region with neurogenic claudication    Takotsubo cardiomyopathy    Thalassemia    Vertigo    Vitamin D  deficiency 11/15/2022   Wears dentures    partial lower    Past Surgical History: Past Surgical History:  Procedure Laterality Date   APPENDECTOMY  08/12/1965   CARPAL TUNNEL RELEASE  08/12/2005   CATARACT EXTRACTION  08/13/2011   CHOLECYSTECTOMY  08/12/1998   COLONOSCOPY  03/11/2014   Dr.Wohl   COLONOSCOPY WITH PROPOFOL  N/A 12/11/2015   Procedure: COLONOSCOPY WITH PROPOFOL ;  Surgeon: Rogelia Copping, MD;  Location: [facility];  Service: Endoscopy;  Laterality: N/A;  DESCENDING COLON POLYP    CORONARY ANGIOPLASTY WITH STENT PLACEMENT  08/12/2008   ESOPHAGOGASTRODUODENOSCOPY  02/21/2014   Dr.Wohl   ESOPHAGOGASTRODUODENOSCOPY (EGD) WITH PROPOFOL  N/A 12/11/2015   Procedure: ESOPHAGOGASTRODUODENOSCOPY (EGD) WITH PROPOFOL ;  Surgeon: Rogelia Copping, MD;  Location: [facility];  Service: Endoscopy;  Laterality: N/A;   INCISION AND DRAINAGE Right 12/17/2021   Procedure: INCISION AND DRAINAGE;  Surgeon: Marchia Drivers, MD;  Location: [facility] OR[facility];  Service: Orthopedics;  Laterality: Right;   KNEE ARTHROSCOPY Right 10/04/2021   Procedure: ARTHROSCOPY KNEE;  Surgeon: Marchia Drivers,  MD;  Location: [facility] OR[facility];  Service: Orthopedics;  Laterality: Right;   KNEE ARTHROSCOPY Right 10/30/2021   Procedure: ARTHROSCOPY KNEE, I&D OF SUPERFICIAL ABSCESS;  Surgeon: Marchia Drivers, MD;  Location: [facility] OR[facility];  Service: Orthopedics;  Laterality: Right;   ORIF HUMERUS FRACTURE Left 11/14/2022   Procedure: OPEN REDUCTION INTERNAL FIXATION (ORIF) PROXIMAL HUMERUS FRACTURE;  Surgeon: Celena Sharper, MD;   Location: [facility] OR;  Service: Orthopedics;  Laterality: Left;   POLYPECTOMY N/A 12/11/2015   Procedure: POLYPECTOMY;  Surgeon: Rogelia Copping, MD;  Location: [facility];  Service: Endoscopy;  Laterality: N/A;   TONSILLECTOMY     TONSILLECTOMY AND ADENOIDECTOMY  08/13/1967    Allergies: Allergies as of 03/16/2024 - Review Complete 02/09/2024  Allergen Reaction Noted   Rosuvastatin  10/12/2019    Medications:  Current Facility-Administered Medications:    ceFAZolin  (ANCEF ) IVPB 2g/100 mL premix, 2 g, Intravenous, Once, Claudene Penne ORN, MD   lactated ringers  infusion, , Intravenous, Continuous, Vicci Camellia Glatter, MD, Last Rate: 10 mL/hr at 04/15/24 1103, New Bag at 04/15/24 1103  Social History: Social History   Tobacco Use   Smoking status: Former    Current packs/day: 0.00    Average packs/day: 0.5 packs/day for 1 year (0.5 ttl pk-yrs)    Types: Cigarettes    Start date: 61    Quit date: 1972    Years since quitting: 53.7   Smokeless tobacco: Never   Tobacco comments:    smoked occasionally  Vaping Use   Vaping status: Never Used  Substance Use Topics   Alcohol use: Yes    Alcohol/week: 1.0 standard drink of alcohol    Types: 1 Shots of liquor per week    Comment: socially   Drug use: No    Family Medical History: Family History  Problem Relation Age of Onset   Cirrhosis Mother    Hepatitis Mother    Thalassemia Mother    Diabetes Mother    Heart disease Father    Diabetes Father    Hyperlipidemia Father    Hypertension Father    Stroke Father    Cancer Sister        lung   Diabetes Sister    Stroke Sister        during a surgery   Diabetes Brother    Heart disease Brother    Hypertension Brother    Stroke Brother    Thalassemia Maternal Grandmother    COPD Neg Hx     Physical Examination: Vitals:   04/15/24 1047  BP: (!) 147/68  Pulse: 97  Resp: 18  Temp: 98.4 F (36.9 C)  SpO2: 99%    General: Patient is in no apparent  distress. Attention to examination is appropriate.  Neck:   Supple.  Full range of motion.  Respiratory: Patient is breathing without any difficulty.   NEUROLOGICAL:     Awake, alert, oriented to person, place, and time.  Speech is clear and fluent.   Cranial Nerves: Pupils equal round and reactive to light.  Facial tone is symmetric.  Facial sensation is symmetric. Shoulder shrug is symmetric. Tongue protrusion is midline.    Strength:  Side Iliopsoas Quads Hamstring PF DF EHL  R 5 5 5 5 5 5   L 5 5 5 5  4+ 3   Reflexes are 2+ and symmetric at the biceps, triceps, brachioradialis, patella and achilles.   Hoffman's is absent. Clonus is absent  Bilateral upper and lower extremity sensation is intact to light touch,  SLR postivie.   Very slight hip drop and steppage gait noted  Imaging: Narrative & Impression  CLINICAL DATA:  4-6 week history of back pain   EXAM: MRI LUMBAR SPINE WITHOUT CONTRAST   TECHNIQUE: Multiplanar, multisequence MR imaging of the lumbar spine was performed. No intravenous contrast was administered.   COMPARISON:  None Available.   FINDINGS: Segmentation:  Standard.   Alignment:  Grade 1 anterolisthesis of L4 on L5   Vertebrae: No fracture, evidence of discitis, or bone lesion. Degenerative endplate changes of the inferior and superior endplates of L2 on L3   Conus medullaris and cauda equina: Conus extends to the L1-L2 disc space level. Conus and cauda equina appear normal.   Paraspinal and other soft tissues: Negative.   Disc levels:   T12-L1: Small central disc protrusion. Mild bilateral facet degenerative change. No spinal canal narrowing. No neural foraminal narrowing.   L1-L2: Left paracentral disc extrusion with cranial migration of the extruded disc material. Mild-to-moderate spinal canal narrowing. Mild bilateral neural foraminal narrowing. Mild bilateral facet degenerative change. There is narrowing of the left lateral recess    L2-L3: Moderate disc space loss. Eccentric left disc bulge. Mild bilateral facet degenerative change. Mild-to-moderate spinal canal narrowing. Moderate left and moderate to severe right neural foraminal narrowing.   L3-L4: Mild bilateral facet degenerative change. Circumferential disc bulge. Mild spinal canal narrowing. Moderate bilateral neural foraminal narrowing.   L4-L5: Moderate bilateral facet degenerative change, left-greater-than-right. Circumferential disc bulge. The extraforaminal component of the circumferential disc bulge may contact the exited right L4 nerve root (series 113, image 30). Ligamentum flavum hypertrophy. Mild overall spinal canal narrowing. Moderate bilateral neural foraminal narrowing. There is narrowing of the left lateral recess.   L5-S1: Severe left and mild right facet degenerative change. Circumferential disc bulge. The foraminal to extraforaminal component of the disc bulge may contact the exited left L5 nerve root (series 113, image 36). Mild overall spinal canal narrowing. Severe narrowing of the left lateral recess. Moderate to severe left neural foraminal narrowing.   IMPRESSION: 1. Multilevel degenerative changes, most notably at L5-S1 where there is an eccentric left disc bulge that severely narrows the left lateral recess with mass effect on the descending left S1 nerve root. This disc bulge likely also contacts the exited left L5 nerve root in the foraminal/extraforaminal zone. 2. Eccentric left disc bulge at L4-L5 likely contacts the exited left L4 nerve root in the extraforaminal zone. 3. Left parcentral disc extrusion at L1-L2 with narrowing of the left lateral recess. 4. Additional degenerative changes, as above.     Electronically Signed   By: Lyndall Gore M.D.   On: 07/30/2023 08:28   I have personally reviewed the images and agree with the above interpretation.  Outside record review Reviewed notes from Dr. Avanell has  notes as well as Ms. Meeler.  They been treating her for predominantly left side lumbosacral radiculitis, she has tried injections as well as had a extensive physical therapy regimen.  Medical Decision Making/Assessment and Plan: Ms. Standley is a pleasant 73 y.o. female with lumbar claudication/radiculopathy.  She has a history of back pain but mostly leg pain that radiates from her back down the posterior aspect of her leg to the lateral and anterior part of her lower leg and foot.  This been going on since November.  At the initial onset this was quite severe she had difficulty with walking and weakness but did get somewhat better.  She was working with physical therapy  and injections.  She does get often claudicatory symptoms when she is standing.  Her pain continues to go down her left lower extremity most significant levels are again at the L4-S1 levels as she has significant stenosis at the L4-5 lateral recess and the foramen of L5-S1.  She did not have robust response to injections, we will plan for a decompression procedure.  This will be for her foraminal stenosis as well as her lateral recess stenosis at L5-S1 and L4-5 on the left respectively.  Thank you for involving me in the care of this patient.    Penne MICAEL Sharps MD/MSCR Neurosurgery

## 2024-04-15 NOTE — Anesthesia Procedure Notes (Signed)
 Procedure Name: Intubation Date/Time: 04/15/2024 11:57 AM  Performed by: Veronica Alm BROCKS, CRNAPre-anesthesia Checklist: Patient identified, Patient being monitored, Timeout performed, Emergency Drugs available and Suction available Patient Re-evaluated:Patient Re-evaluated prior to induction Oxygen Delivery Method: Circle system utilized Preoxygenation: Pre-oxygenation with 100% oxygen Induction Type: IV induction Ventilation: Mask ventilation without difficulty Laryngoscope Size: 3 and McGrath Grade View: Grade I Tube type: Oral Tube size: 7.5 mm Number of attempts: 1 Airway Equipment and Method: Stylet Placement Confirmation: ETT inserted through vocal cords under direct vision, positive ETCO2 and breath sounds checked- equal and bilateral Secured at: 21 cm Tube secured with: Tape Dental Injury: Teeth and Oropharynx as per pre-operative assessment

## 2024-04-15 NOTE — Op Note (Signed)
 Indications: [name] is suffering from lumbar radiculopathy. The patient tried and failed conservative management, prompting surgical intervention.  Findings: Severe lateral recess stenosis secondary to connective tissue overgrowth ligamentum flavum overgrowth, and facet arthropathy.  Well decompressed at the end of the procedure.  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  Postoperative Diagnosis: Lumbar lateral recess stenosis with neurogenic claudication and radiculopathy, left-sided, L4-S1.   EBL: Minimal IVF: see anesthesia record Drains: none Disposition: Extubated and Stable to PACU Complications: none  No foley catheter was placed.   Preoperative Note:   Risks of surgery discussed include: infection, bleeding, stroke, coma, death, paralysis, CSF leak, nerve/spinal cord injury, numbness, tingling, weakness, complex regional pain syndrome, recurrent stenosis and/or disc herniation, vascular injury, development of instability, neck/back pain, need for further surgery, persistent symptoms, development of deformity, and the risks of anesthesia. The patient understood these risks and agreed to proceed.  Operative Note:   1) Left L4/S1 hemilaminectomy, medial facetectomy, lateral recess decompression.  The patient was then brought from the preoperative center with intravenous access established.  The patient underwent general anesthesia and endotracheal tube intubation, and was then rotated on the [place] rail top where all pressure points were appropriately padded.  The skin was then thoroughly cleansed.  Perioperative antibiotic prophylaxis was administered.  Sterile prep and drapes were then applied and a timeout was then observed.  C-arm was brought into the field under sterile conditions, and the L 4-5 disc space identified and marked with an incision on the left 1cm lateral to midline.  Once  this was complete a 2 cm incision was opened with the use of a #10 blade knife.  The Metrx tubes were sequentially advanced under lateral fluoroscopy until a 18 x 50 mm Metrx tube was placed over the facet and lamina and secured to the bed.    The microscope was then sterilely brought into the field and muscle creep was hemostased with a bipolar and resected with a pituitary rongeur.  A Bovie extender was then used to expose the spinous process and lamina.  Careful attention was placed to not violate the facet capsule. A 3 mm matchstick drill bit was then used to make a hemi-laminotomy trough until the ligamentum flavum was exposed.  This was extended to the base of the spinous process.  Once this was complete and the underlying ligamentum flavum was visualized, the ligamentum was dissected with an up angle curette and resected with a #2 and #3 mm biting Kerrison.  The laminotomy opening was also expanded in similar fashion and hemostasis was obtained with Surgifoam and a patty as well as bone wax.  The rostral aspect of the caudal level of the lamina was also resected with a #2 biting Kerrison effort to further enhance exposure.  We were able to see the traversing nerve root, there is no evidence of ongoing stenosis.  We felt both cranially and caudally and did not see any active compression anymore.  We irrigated copiously.  We obtained meticulous hemostasis.  Depo-Medrol  was placed on the nerve root.  We then turned our attention to the L5-S1 level.  In the same fashion graduated tubular retractors were placed.  They are placed on the inferior aspect of the lamina of L 5, we could palpate the superior aspect of S1.  Utilizing high-speed drill hemilaminectomy was performed.  This was brought up to the cranial insertion of the ligamentum on the underside of the lamina, as well as  to the insertion of the ligamentum on the superior aspect of the S1 lamina.  We utilized an upgoing curette to dissect this from the  underside of the lamina and lateral recess, we then continued to remove the ligamentum flavum and compressive connective tissue from the medial aspect of the joint.  We followed this both cranially and caudally and saw no areas of ongoing compression.  We verified with x-rays.  The thecal sac was well decompressed.  The traversing S1 nerve root was well decompressed.  There is no areas of ongoing compression.  We then irrigated copiously.  We placed Depo-Medrol  in the nerve root. .    The fascial layer was reapproximated with the use of a 0- Vicryl suture.  Subcutaneous tissue layer was reapproximated using 2-0 Vicryl suture.  3-0 monocryl was used on the skin. The skin was then cleansed and Dermabond was used to close the skin opening.  Patient was then rotated back to the preoperative bed awakened from anesthesia and taken to recovery all counts are correct in this case.  I performed the entire procedure with the assistance of  Lyle Decamp, [place] as an Designer, television/film set. An assistant was required for this procedure due to the complexity.  The assistant provided assistance in tissue manipulation and suction, closure and was required for the successful and safe performance of the procedure. I performed the critical portions of the procedure.   Penne LELON Sharps, MD [facility] Neurosurgery

## 2024-04-15 NOTE — Anesthesia Preprocedure Evaluation (Signed)
 Anesthesia Evaluation  Patient identified by MRN, date of birth, ID band Patient awake    Reviewed: Allergy & Precautions, NPO status , Patient's Chart, lab work & pertinent test results  History of Anesthesia Complications Negative for: history of anesthetic complications  Airway Mallampati: III  TM Distance: >3 FB Neck ROM: full    Dental  (+) Partial Lower   Pulmonary neg pulmonary ROS, former smoker   Pulmonary exam normal        Cardiovascular hypertension, On Medications + CAD and + Cardiac Stents  Normal cardiovascular exam  Stress echocardiogram was performed on 04/17/2023 revealing normal left ventricular systolic function with an EF of >55%.  There were no regional wall motion abnormalities.  Right ventricular size and function normal.  No significant valvular regurgitation or stenosis was noted.  Patient was stressed for a total of 5 minutes and 48 seconds, during which she achieved a maximal heart rate of 122 bpm and 4.2 METS.   Neuro/Psych  PSYCHIATRIC DISORDERS Anxiety Depression     Neuromuscular disease    GI/Hepatic Neg liver ROS,GERD  ,,  Endo/Other  negative endocrine ROS    Renal/GU      Musculoskeletal   Abdominal   Peds  Hematology  (+) Blood dyscrasia, anemia   Anesthesia Other Findings Past Medical History: No date: Anemia No date: Anxiety No date: Aortic atherosclerosis (HCC) No date: Arthritis 10/15/2016: Bone island     Comment:  a.) RIGHT humeral head No date: CAD (coronary artery disease)     Comment:  a.) 2010 --> PCI with stent placement x 1 (type and               location unknown) No date: Cataract No date: Depression No date: GERD (gastroesophageal reflux disease) No date: Hyperlipidemia No date: Hypertension No date: IBS (irritable bowel syndrome) No date: Insomnia     Comment:  a.) on hypnotic PRN (zolpidem ) No date: Long-term current use of immunosuppressive biologic  agent     Comment:  a.) on TNF blocker (adalimumab ) for psoriatic arthritis 10/18/2016: Nodule of right lung 11/15/2022: Osteoporosis No date: Psoriatic arthritis (HCC)     Comment:  a. Tx'd with adalimumab  12/16/2021: Septic joint of right knee joint (HCC) No date: Spinal stenosis of lumbar region with neurogenic claudication No date: Takotsubo cardiomyopathy No date: Thalassemia No date: Vertigo 11/15/2022: Vitamin D  deficiency No date: Wears dentures     Comment:  partial lower  Past Surgical History: 08/12/1965: APPENDECTOMY 08/12/2005: CARPAL TUNNEL RELEASE 08/13/2011: CATARACT EXTRACTION 08/12/1998: CHOLECYSTECTOMY 03/11/2014: COLONOSCOPY     Comment:  Dr.Wohl 12/11/2015: COLONOSCOPY WITH PROPOFOL ; N/A     Comment:  Procedure: COLONOSCOPY WITH PROPOFOL ;  Surgeon: Rogelia Copping, MD;  Location: [facility];  Service:               Endoscopy;  Laterality: N/A;  DESCENDING COLON POLYP  08/12/2008: CORONARY ANGIOPLASTY WITH STENT PLACEMENT 02/21/2014: ESOPHAGOGASTRODUODENOSCOPY     Comment:  Dr.Wohl 12/11/2015: ESOPHAGOGASTRODUODENOSCOPY (EGD) WITH PROPOFOL ; N/A     Comment:  Procedure: ESOPHAGOGASTRODUODENOSCOPY (EGD) WITH               PROPOFOL ;  Surgeon: Rogelia Copping, MD;  Location: [facility];  Service: Endoscopy;  Laterality: N/A; 12/17/2021: INCISION AND DRAINAGE; Right     Comment:  Procedure: INCISION AND DRAINAGE;  Surgeon: Marchia Drivers, MD;  Location: [facility] OR[facility];  Service: Orthopedics;                Laterality: Right; 10/04/2021: KNEE ARTHROSCOPY; Right     Comment:  Procedure: ARTHROSCOPY KNEE;  Surgeon: Marchia Drivers,              MD;  Location: [facility];  Service: Orthopedics;                Laterality: Right; 10/30/2021: KNEE ARTHROSCOPY; Right     Comment:  Procedure: ARTHROSCOPY KNEE, I&D OF SUPERFICIAL ABSCESS;              Surgeon: Marchia Drivers, MD;  Location: [facility] OR[facility];                 Service: Orthopedics;  Laterality: Right; 11/14/2022: ORIF HUMERUS FRACTURE; Left     Comment:  Procedure: OPEN REDUCTION INTERNAL FIXATION (ORIF)               PROXIMAL HUMERUS FRACTURE;  Surgeon: Celena Sharper, MD;               Location: [facility];  Service: Orthopedics;  Laterality:               Left; 12/11/2015: POLYPECTOMY; N/A     Comment:  Procedure: POLYPECTOMY;  Surgeon: Rogelia Copping, MD;                Location: [facility];  Service: Endoscopy;                Laterality: N/A; No date: TONSILLECTOMY 08/13/1967: TONSILLECTOMY AND ADENOIDECTOMY     Reproductive/Obstetrics negative OB ROS                              Anesthesia Physical Anesthesia Plan  ASA: 3  Anesthesia Plan: General ETT   Post-op Pain Management: Toradol  IV (intra-op)*, Ofirmev  IV (intra-op)*, Dilaudid  IV and Ketamine IV*   Induction: Intravenous  PONV Risk Score and Plan: 3 and Ondansetron , Dexamethasone  and Treatment may vary due to age or medical condition  Airway Management Planned: Oral ETT  Additional Equipment:   Intra-op Plan:   Post-operative Plan: Extubation in OR  Informed Consent: I have reviewed the patients History and Physical, chart, labs and discussed the procedure including the risks, benefits and alternatives for the proposed anesthesia with the patient or authorized representative who has indicated his/her understanding and acceptance.     Dental Advisory Given  Plan Discussed with: Anesthesiologist, CRNA and Surgeon  Anesthesia Plan Comments: (Patient consented for risks of anesthesia including but not limited to:  - adverse reactions to medications - damage to eyes, teeth, lips or other oral mucosa - nerve damage due to positioning  - sore throat or hoarseness - Damage to heart, brain, nerves, lungs, other parts of body or loss of life  Patient voiced understanding and assent.)         Anesthesia Quick Evaluation

## 2024-04-15 NOTE — Discharge Instructions (Signed)
 Your surgeon has performed an operation on your lumbar spine (low back) to relieve pressure on one or more nerves. Many times, patients feel better immediately after surgery and can "overdo it." Even if you feel well, it is important that you follow these activity guidelines. If you do not let your back heal properly from the surgery, you can increase the chance of  complications and/or return of your symptoms. The following are instructions to help in your recovery once you have been discharged from the hospital.   Activity    No bending, lifting, or twisting ("BLT"). Avoid lifting objects heavier than 10 pounds (gallon milk jug).  Where possible, avoid household activities that involve lifting, bending, pushing, or pulling such as laundry, vacuuming, grocery shopping, and childcare. Try to arrange for help from friends and family for these activities while your back heals.  Increase physical activity slowly as tolerated.  Taking short walks is encouraged, but avoid strenuous exercise. Do not jog, run, bicycle, lift weights, or participate in any other exercises unless specifically allowed by your doctor. Avoid prolonged sitting, including car rides.  Talk to your doctor before resuming sexual activity.  You should not drive until cleared by your doctor.  Until released by your doctor, you should not return to work or school.  You should rest at home and let your body heal.   You may shower three days after your surgery.  After showering, lightly dab your incision dry. Do not take a tub bath or go swimming for 3 weeks, or until approved by your doctor at your follow-up appointment.  If you smoke, we strongly recommend that you quit.  Smoking has been proven to interfere with normal healing in your back and will dramatically reduce the success rate of your surgery. Please contact [facility] (800-QUIT-NOW) and use the resources at www.[facility].com for assistance in stopping smoking.  Surgical  Incision   If you have a dressing on your incision, you may remove it three days after your surgery. Keep your incision area clean and dry.  Your incision was closed with Dermabond glue. The glue should begin to peel away within about a week.  Diet            You may return to your usual diet. Be sure to stay hydrated.  You have been prescribed narcotic pain medications.  This often will cause constipation along with the anesthesia that you underwent.  Please obtain Colace and senna. This has been sent to your local pharmacy, but at times it is not covered by insurance and you may obtain it over the counter..  You should take a stool softener and laxative for the duration of you taking the narcotic pain medications.  When to Contact Us   Although your surgery and recovery will likely be uneventful, you may have some residual numbness, aches, and pains in your back and/or legs. This is normal and should improve in the next few weeks.  However, should you experience any of the following, contact us  immediately: New numbness or weakness Pain that is progressively getting worse, and is not relieved by your pain medications or rest Bleeding, redness, swelling, pain, or drainage from surgical incision Chills or flu-like symptoms Fever greater than 101.0 F (38.3 C) Problems with bowel or bladder functions Difficulty breathing or shortness of breath Warmth, tenderness, or swelling in your calf  Contact Information How to contact us :  If you have any questions/concerns before or after surgery, you can reach us  at [phone],  [phone], or you can send a mychart message. We can be reached by phone or mychart 8am-4pm, Monday-Friday.  *Please note: Calls after 4pm are forwarded to a third party answering service. Mychart messages are not routinely monitored during evenings, weekends, and holidays. Please call our office to contact the answering service for urgent concerns during non-business hours.

## 2024-04-15 NOTE — Transfer of Care (Signed)
 Immediate Anesthesia Transfer of Care Note  Patient: Lydia Elliott  Procedure(s) Performed: Left side, lumbar laminectomy, medial facetectomy, foraminotomies.  L4-S1. (Left)  Patient Location: PACU  Anesthesia Type:General  Level of Consciousness: awake  Airway & Oxygen Therapy: Patient Spontanous Breathing and Patient connected to face mask oxygen  Post-op Assessment: Report given to RN  Post vital signs: Reviewed and stable  Last Vitals:  Vitals Value Taken Time  BP 104/91 04/15/24 13:45  Temp 36.2 C 04/15/24 13:45  Pulse 83 04/15/24 13:49  Resp 18 04/15/24 13:49  SpO2 98 % 04/15/24 13:49  Vitals shown include unfiled device data.  Last Pain:  Vitals:   04/15/24 1047  TempSrc: Oral  PainSc: 0-No pain         Complications: No notable events documented.

## 2024-04-16 ENCOUNTER — Encounter: Payer: Self-pay | Admitting: Neurosurgery

## 2024-04-16 NOTE — Anesthesia Postprocedure Evaluation (Signed)
 Anesthesia Post Note  Patient: Lydia Elliott  Procedure(s) Performed: Left side, lumbar laminectomy, medial facetectomy, foraminotomies.  L4-S1. (Left)  Patient location during evaluation: PACU Anesthesia Type: General Level of consciousness: awake and alert Pain management: pain level controlled Vital Signs Assessment: post-procedure vital signs reviewed and stable Respiratory status: spontaneous breathing, nonlabored ventilation, respiratory function stable and patient connected to nasal cannula oxygen Cardiovascular status: blood pressure returned to baseline and stable Postop Assessment: no apparent nausea or vomiting Anesthetic complications: no   No notable events documented.   Last Vitals:  Vitals:   04/15/24 1530 04/15/24 1639  BP: (!) 146/54 (!) 147/73  Pulse: 69 97  Resp: 16   Temp: (!) 36.3 C (!) 36.2 C  SpO2: 97% 97%    Last Pain:  Vitals:   04/15/24 1639  TempSrc: Temporal  PainSc: 3                  [name]

## 2024-04-23 ENCOUNTER — Other Ambulatory Visit: Payer: Self-pay | Admitting: Physician Assistant

## 2024-04-23 MED ORDER — METHYLPREDNISOLONE 4 MG PO TBPK: ORAL_TABLET | ORAL | 0 refills | Status: DC

## 2024-04-28 ENCOUNTER — Ambulatory Visit (INDEPENDENT_AMBULATORY_CARE_PROVIDER_SITE_OTHER): Admitting: Physician Assistant

## 2024-04-28 VITALS — BP 108/62 | Ht 62.0 in | Wt 149.0 lb

## 2024-04-28 DIAGNOSIS — Z09 Encounter for follow-up examination after completed treatment for conditions other than malignant neoplasm: Secondary | ICD-10-CM

## 2024-04-28 DIAGNOSIS — E782 Mixed hyperlipidemia: Secondary | ICD-10-CM | POA: Diagnosis not present

## 2024-04-28 DIAGNOSIS — R7303 Prediabetes: Secondary | ICD-10-CM | POA: Diagnosis not present

## 2024-04-28 DIAGNOSIS — L4050 Arthropathic psoriasis, unspecified: Secondary | ICD-10-CM | POA: Diagnosis not present

## 2024-04-28 DIAGNOSIS — M5416 Radiculopathy, lumbar region: Secondary | ICD-10-CM

## 2024-04-28 DIAGNOSIS — I10 Essential (primary) hypertension: Secondary | ICD-10-CM | POA: Diagnosis not present

## 2024-04-28 DIAGNOSIS — F32A Depression, unspecified: Secondary | ICD-10-CM | POA: Diagnosis not present

## 2024-04-28 NOTE — Progress Notes (Signed)
   REFERRING PHYSICIAN:  Epifanio Alm SQUIBB, Md [address] [place],  [place] [address]  DOS: 04/15/24, [name]  HISTORY OF PRESENT ILLNESS: Lydia Elliott is approximately 2 weeks status post [name] lumbar lami and foraminotomies. she is doing well.  She does have some pain in her buttock, but this is after stopping gabapentin .  She has now restarted gabapentin  and has also taken a Medrol  Dosepak which has helped some.    PHYSICAL EXAMINATION:  General: Patient is well developed, well nourished, calm, collected, and in no apparent distress.   NEUROLOGICAL:  General: In no acute distress.   Awake, alert, oriented to person, place, and time.  Pupils equal round and reactive to light.  Facial tone is symmetric.     Strength:            Side Iliopsoas Quads Hamstring PF DF EHL  R 5 5 5 5 5 5   L 4+ 5 5 5 5 5    Incision c/d/i   ROS (Neurologic):  Negative except as noted above  IMAGING: No new imaging prior to this appointment  ASSESSMENT/PLAN:  Taler Kushner is doing well approximately 2 weeks after left L4 S1 lumbar Lami and foraminotomies. she will follow up in approximately 1 month for 6-week postop visit.  We discussed continuing gabapentin , taking the muscle relaxer more often for her pain.   I have advised the patient to lift up to 10 pounds until 6 weeks after surgery, then increase up to 25 pounds until 12 weeks after surgery.  After 12 weeks post-op, the patient advised to increase activity as tolerated.  Advised to contact the office if any questions or concerns arise.  Lyle Decamp PA-C Department of neurosurgery

## 2024-05-05 DIAGNOSIS — I10 Essential (primary) hypertension: Secondary | ICD-10-CM | POA: Diagnosis not present

## 2024-05-05 DIAGNOSIS — Z23 Encounter for immunization: Secondary | ICD-10-CM | POA: Diagnosis not present

## 2024-05-05 DIAGNOSIS — L4050 Arthropathic psoriasis, unspecified: Secondary | ICD-10-CM | POA: Diagnosis not present

## 2024-05-05 DIAGNOSIS — F32A Depression, unspecified: Secondary | ICD-10-CM | POA: Diagnosis not present

## 2024-05-05 DIAGNOSIS — E782 Mixed hyperlipidemia: Secondary | ICD-10-CM | POA: Diagnosis not present

## 2024-05-05 DIAGNOSIS — R7303 Prediabetes: Secondary | ICD-10-CM | POA: Diagnosis not present

## 2024-05-26 ENCOUNTER — Ambulatory Visit (INDEPENDENT_AMBULATORY_CARE_PROVIDER_SITE_OTHER): Admitting: Neurosurgery

## 2024-05-26 ENCOUNTER — Encounter: Payer: Self-pay | Admitting: Neurosurgery

## 2024-05-26 VITALS — BP 124/66 | Temp 98.1°F | Ht 62.0 in | Wt 153.2 lb

## 2024-05-26 DIAGNOSIS — M48062 Spinal stenosis, lumbar region with neurogenic claudication: Secondary | ICD-10-CM

## 2024-05-26 DIAGNOSIS — Z09 Encounter for follow-up examination after completed treatment for conditions other than malignant neoplasm: Secondary | ICD-10-CM

## 2024-05-26 DIAGNOSIS — M5416 Radiculopathy, lumbar region: Secondary | ICD-10-CM

## 2024-05-26 MED ORDER — TRAMADOL HCL 50 MG PO TABS: 50.0000 mg | ORAL_TABLET | Freq: Four times a day (QID) | ORAL | 0 refills | Status: DC | PRN

## 2024-05-27 DIAGNOSIS — M65311 Trigger thumb, right thumb: Secondary | ICD-10-CM | POA: Diagnosis not present

## 2024-05-27 DIAGNOSIS — M65331 Trigger finger, right middle finger: Secondary | ICD-10-CM | POA: Diagnosis not present

## 2024-05-28 ENCOUNTER — Other Ambulatory Visit: Payer: Self-pay | Admitting: Neurosurgery

## 2024-05-28 NOTE — Telephone Encounter (Signed)
 Yes, for historical documentation it was me updating patient's directions on that day as how she takes it now. And I called patient today and confirmed this information. Below are her directions now and I updated medication request with these directions also for review.   Take 100 mg-1 in the morning, 1 in the afternoon, and 3 at bedtime

## 2024-06-09 DIAGNOSIS — H02831 Dermatochalasis of right upper eyelid: Secondary | ICD-10-CM | POA: Diagnosis not present

## 2024-06-09 DIAGNOSIS — H26493 Other secondary cataract, bilateral: Secondary | ICD-10-CM | POA: Diagnosis not present

## 2024-06-09 DIAGNOSIS — H02834 Dermatochalasis of left upper eyelid: Secondary | ICD-10-CM | POA: Diagnosis not present

## 2024-06-09 DIAGNOSIS — H524 Presbyopia: Secondary | ICD-10-CM | POA: Diagnosis not present

## 2024-06-11 NOTE — Progress Notes (Signed)
   REFERRING PHYSICIAN:  Epifanio Alm SQUIBB, Md [address] [place],  [place] [address]  DOS: 04/15/24, [name]  HISTORY OF PRESENT ILLNESS: Lydia Elliott is approximately 6 weeks status post L4-S1 laminectomy.  Overall she is doing well.  She is able to walk more than she has.  She did have some self-guided changes in her medication but once she got back onto a stable regimen was doing better.  She states that she is able to walk enough that she will often overdo it.   PHYSICAL EXAMINATION:  General: Patient is well developed, well nourished, calm, collected, and in no apparent distress.   NEUROLOGICAL:  General: In no acute distress.   Awake, alert, oriented to person, place, and time.  Pupils equal round and reactive to light.  Facial tone is symmetric.     Strength:            Side Iliopsoas Quads Hamstring PF DF EHL  R 5 5 5 5 5 5   L 4+ 5 5 5 5 5    Incision c/d/i   ROS (Neurologic):  Negative except as noted above  IMAGING: No new imaging prior to this appointment  ASSESSMENT/PLAN:  [name] is doing well approximately 6 weeks out from lumbar laminectomy.  We discussed that she needs to make sure to regulate herself and her exertion.  She has had an improvement in her walking capacity and in such has had moments where she has walked so far or so much done so much that day that she has had significant pain on the following day.  I let her know that she is still very early in her recovery and that because she is feeling better neurologically that she has the ability to push herself into a point of pain accidentally.  I let her know that she still needs to continue to be somewhat careful during her recovery as she can overdo it and set herself back.  I also let her know that she still has a significant amount of time in recovery for seeing the benefit of surgery.  She has had a significant functional improvement but is still having pain on days especially when she  overdoes it.  Penne MICAEL Sharps, MD Department of neurosurgery

## 2024-06-16 MED ORDER — METHYLPREDNISOLONE 4 MG PO TBPK: ORAL_TABLET | ORAL | 0 refills | Status: DC

## 2024-06-23 ENCOUNTER — Other Ambulatory Visit: Payer: Self-pay | Admitting: Physician Assistant

## 2024-06-23 DIAGNOSIS — G8918 Other acute postprocedural pain: Secondary | ICD-10-CM

## 2024-06-23 DIAGNOSIS — M48062 Spinal stenosis, lumbar region with neurogenic claudication: Secondary | ICD-10-CM

## 2024-06-23 DIAGNOSIS — M5416 Radiculopathy, lumbar region: Secondary | ICD-10-CM

## 2024-06-29 ENCOUNTER — Ambulatory Visit
Admission: RE | Admit: 2024-06-29 | Discharge: 2024-06-29 | Disposition: A | Discharge status: Home / Self Care | Source: Ambulatory Visit | Attending: Physician Assistant | Admitting: Physician Assistant

## 2024-06-29 DIAGNOSIS — M5117 Intervertebral disc disorders with radiculopathy, lumbosacral region: Secondary | ICD-10-CM | POA: Diagnosis not present

## 2024-06-29 DIAGNOSIS — G8918 Other acute postprocedural pain: Secondary | ICD-10-CM

## 2024-06-29 DIAGNOSIS — M4726 Other spondylosis with radiculopathy, lumbar region: Secondary | ICD-10-CM | POA: Diagnosis not present

## 2024-06-29 DIAGNOSIS — M5416 Radiculopathy, lumbar region: Secondary | ICD-10-CM

## 2024-06-29 DIAGNOSIS — M48062 Spinal stenosis, lumbar region with neurogenic claudication: Secondary | ICD-10-CM

## 2024-06-29 MED ORDER — GADOPICLENOL 0.5 MMOL/ML IV SOLN
7.5000 mL | Freq: Once | INTRAVENOUS | Status: AC | PRN
  Administered 2024-06-29: 7.5 mL via INTRAVENOUS

## 2024-07-01 ENCOUNTER — Other Ambulatory Visit: Payer: Self-pay | Admitting: Neurosurgery

## 2024-07-01 DIAGNOSIS — M5416 Radiculopathy, lumbar region: Secondary | ICD-10-CM

## 2024-07-05 ENCOUNTER — Ambulatory Visit (INDEPENDENT_AMBULATORY_CARE_PROVIDER_SITE_OTHER): Admitting: Physician Assistant

## 2024-07-05 ENCOUNTER — Encounter: Payer: Self-pay | Admitting: Physician Assistant

## 2024-07-05 VITALS — BP 134/72 | Temp 98.3°F | Ht 62.0 in | Wt 153.1 lb

## 2024-07-05 DIAGNOSIS — G8918 Other acute postprocedural pain: Secondary | ICD-10-CM

## 2024-07-05 DIAGNOSIS — Z09 Encounter for follow-up examination after completed treatment for conditions other than malignant neoplasm: Secondary | ICD-10-CM

## 2024-07-05 DIAGNOSIS — M48062 Spinal stenosis, lumbar region with neurogenic claudication: Secondary | ICD-10-CM

## 2024-07-05 MED ORDER — CELECOXIB 200 MG PO CAPS: 200.0000 mg | ORAL_CAPSULE | Freq: Every day | ORAL | 2 refills | Status: AC

## 2024-07-05 NOTE — Progress Notes (Unsigned)
 REFERRING PHYSICIAN:  Epifanio Alm SQUIBB, Md [address] [place],  [place] [address]  DOS: 04/15/24, [name]  HISTORY OF PRESENT ILLNESS: Lydia Elliott is approximately 12 weeks status post L4-S1 laminectomy.  Overall, she is very frustrated and in quite a bit of pain.  She feels as though it has become worse compared to prior to surgery.  Initially there was some medications that were changed by the patient.  We have tried to get those back on track.  The pain currently extends from her left buttock down the back of her left leg wraps around her knee to the outside of her calf and top of her foot.  She was given 2 rounds of steroids, because she continues to take tramadol ,gabapentin  and Robaxin  with out any relief.  No new saddle anesthesia or incontinence.    PHYSICAL EXAMINATION:  General: Patient is well developed, well nourished, calm, collected, and in no apparent distress.   NEUROLOGICAL:  General: In no acute distress.   Awake, alert, oriented to person, place, and time.  Pupils equal round and reactive to light.  Facial tone is symmetric.     Strength:            Side Iliopsoas Quads Hamstring PF DF EHL  R 5 5 5 5 5 5   L 4 5 4+ (pain) 5 4- 4-   Incision c/d/i   ROS (Neurologic):  Negative except as noted above  IMAGING: EXAM: MRI LUMBAR SPINE 06/29/2024 01:46:00 PM With and Without Intravenous Contrast   TECHNIQUE: Multiplanar multisequence MRI of the lumbar spine was performed with and without the administration of intravenous contrast.   CONTRAST: 7.5 mL of Vueway .   COMPARISON: MR Lumbar Spine without IV contrast 07/18/2023.   CLINICAL HISTORY: Low back pain radiating down left hip, lower extremities and into toes with numbness, status post L4-S1 laminectomy.   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 Type 2 Modic changes are present on the right at L2-L3. Grade 1 degenerative  anterolisthesis measures 4 mm, stable. Normal vertebral body heights. Bone marrow signal is unremarkable.   SPINAL CORD: The conus medullaris terminates at L1.   SOFT TISSUES: No paraspinal mass.   L1-L2: A left paramedian disc extrusion is again seen at L1-L2. Disc material extends 2 cm cephalocaudad about the disc space and the left lateral recess. Mild left lateral recess and subarticular narrowing is stable.   L2-L3: A rightward disc protrusion and asymmetric right-sided facet hypertrophy has progressed at L2-L3 with moderate right subarticular and severe right foraminal stenosis. Moderate left foraminal narrowing is present. Increased T2 signal of central nerve roots is noted.   L3-L4: A broad-based disc protrusion and moderate facet hypertrophy at L3-L4 similar to the prior exam. Moderate foraminal stenosis is stable, right greater than left.   L4-L5: Moderate subarticular and foraminal stenosis demonstrate slight progression bilaterally.   L5-S1: A disc protrusion and asymmetric advanced left-sided facet hypertrophy is present. Moderate left subarticular and foraminal stenosis is similar to the prior study.   IMPRESSION: 1. 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 2. Moderate subarticular and foraminal stenosis at L4-5 with slight progression bilaterally. 3. Left paramedian disc extrusion at L1-2 with mild left lateral recess and subarticular narrowing, stable. 4. Broad-based disc protrusion and moderate  facet hypertrophy at L3-4 with moderate foraminal stenosis, stable, right greater than left. 5. Status post L4-S1 laminectomy.  ASSESSMENT/PLAN:  Lydia Elliott is is approximately 12 weeks status post L4-S1 laminectomy.   Unfortunately she is in quite a bit of pain largely in the left L4 distribution.  We discussed making sure she is careful in activities that she participates in and how she regulates her pain medication.  We did discuss her most recent MRI that was completed as detailed above.  Plan includes the following moving forward:  -Physical therapy referral placed - Reached out to pain team about a lumbar ESI to try to help get her relief in the interim. - Plan to increase gabapentin  to 3 times a day to try to help her pain. - Will coordinate a visit with Dr. Claudene so she can discuss this with him further.    Lyle Decamp, PA-C Department of Neurosurgery

## 2024-07-12 DIAGNOSIS — M5417 Radiculopathy, lumbosacral region: Secondary | ICD-10-CM | POA: Diagnosis not present

## 2024-07-12 DIAGNOSIS — M9905 Segmental and somatic dysfunction of pelvic region: Secondary | ICD-10-CM | POA: Diagnosis not present

## 2024-07-12 DIAGNOSIS — M4306 Spondylolysis, lumbar region: Secondary | ICD-10-CM | POA: Diagnosis not present

## 2024-07-12 DIAGNOSIS — M9903 Segmental and somatic dysfunction of lumbar region: Secondary | ICD-10-CM | POA: Diagnosis not present

## 2024-07-13 ENCOUNTER — Other Ambulatory Visit

## 2024-07-14 DIAGNOSIS — M9903 Segmental and somatic dysfunction of lumbar region: Secondary | ICD-10-CM | POA: Diagnosis not present

## 2024-07-14 DIAGNOSIS — M9905 Segmental and somatic dysfunction of pelvic region: Secondary | ICD-10-CM | POA: Diagnosis not present

## 2024-07-14 DIAGNOSIS — M5417 Radiculopathy, lumbosacral region: Secondary | ICD-10-CM | POA: Diagnosis not present

## 2024-07-14 DIAGNOSIS — M4306 Spondylolysis, lumbar region: Secondary | ICD-10-CM | POA: Diagnosis not present

## 2024-07-16 DIAGNOSIS — M48062 Spinal stenosis, lumbar region with neurogenic claudication: Secondary | ICD-10-CM | POA: Diagnosis not present

## 2024-07-16 DIAGNOSIS — M5416 Radiculopathy, lumbar region: Secondary | ICD-10-CM | POA: Diagnosis not present

## 2024-07-20 NOTE — Telephone Encounter (Signed)
 Pt daughter called in to inform that her mothers condition has not gotten better after the injection that was done on Friday. She is to the point of where she cannot move & is needing to know what she can do in the meantime. I did place her on the [facility] in case of any cancellations.

## 2024-07-20 NOTE — Telephone Encounter (Signed)
 Contacted patient to inform her that Dr Claudene had a cancellation and has offered to see her tomorrow at 11:30am. She agreed to this appointment.

## 2024-07-21 ENCOUNTER — Ambulatory Visit

## 2024-07-21 ENCOUNTER — Telehealth: Payer: Self-pay | Admitting: Physician Assistant

## 2024-07-21 ENCOUNTER — Ambulatory Visit: Admitting: Neurosurgery

## 2024-07-21 VITALS — BP 130/80

## 2024-07-21 DIAGNOSIS — M25552 Pain in left hip: Secondary | ICD-10-CM

## 2024-07-21 DIAGNOSIS — M5416 Radiculopathy, lumbar region: Secondary | ICD-10-CM | POA: Diagnosis not present

## 2024-07-21 DIAGNOSIS — M1612 Unilateral primary osteoarthritis, left hip: Secondary | ICD-10-CM | POA: Diagnosis not present

## 2024-07-21 MED ORDER — TRAMADOL HCL 50 MG PO TABS: 50.0000 mg | ORAL_TABLET | Freq: Four times a day (QID) | ORAL | 0 refills | Status: AC | PRN

## 2024-07-21 NOTE — Telephone Encounter (Signed)
 Pt daughter called in to inform us : Following the injection procedure, the patient has experienced a loss of bowel control and has required frequent restroom use.

## 2024-07-21 NOTE — Patient Instructions (Signed)

## 2024-07-21 NOTE — Progress Notes (Signed)
 REFERRING PHYSICIAN:  Epifanio Alm SQUIBB, Md [address] [place],  [place] [address]  DOS: 04/15/24, [name]  HISTORY OF PRESENT ILLNESS: Lydia Elliott is approximately approximately 3 3 months status post L4-S1 laminectomy.  She had an improvement in her motor function overall and was improving in her ambulation but does continue to have significant lower extremity distal neuropathic issues.  She had a recent injection which helped with her distal pain she states that this is now tolerable but still present, but after being in the prone position had severe pain in her left hip which has been making it difficult for her to bear weight.  PHYSICAL EXAMINATION:  General: Patient is well developed, well nourished, calm, collected, and in no apparent distress.   NEUROLOGICAL:  General: In no acute distress.   Awake, alert, oriented to person, place, and time.  Pupils equal round and reactive to light.  Facial tone is symmetric.     Strength:            Side Iliopsoas Quads Hamstring PF DF EHL  R 5 5 5 5 5 5   L 4 5 4+ (pain) 5 4 4    Severe pain to palpation in the lateral trochanteric area, external rotation is without difficulty, however internal rotation causes severe pain in her left hip.   ROS (Neurologic):  Negative except as noted above  IMAGING: EXAM: MRI LUMBAR SPINE 06/29/2024 01:46:00 PM With and Without Intravenous Contrast   TECHNIQUE: Multiplanar multisequence MRI of the lumbar spine was performed with and without the administration of intravenous contrast.   CONTRAST: 7.5 mL of Vueway .   COMPARISON: MR Lumbar Spine without IV contrast 07/18/2023.   CLINICAL HISTORY: Low back pain radiating down left hip, lower extremities and into toes with numbness, status post L4-S1 laminectomy.   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 Type 2 Modic changes are present on the right at L2-L3. Grade 1  degenerative anterolisthesis measures 4 mm, stable. Normal vertebral body heights. Bone marrow signal is unremarkable.   SPINAL CORD: The conus medullaris terminates at L1.   SOFT TISSUES: No paraspinal mass.   L1-L2: A left paramedian disc extrusion is again seen at L1-L2. Disc material extends 2 cm cephalocaudad about the disc space and the left lateral recess. Mild left lateral recess and subarticular narrowing is stable.   L2-L3: A rightward disc protrusion and asymmetric right-sided facet hypertrophy has progressed at L2-L3 with moderate right subarticular and severe right foraminal stenosis. Moderate left foraminal narrowing is present. Increased T2 signal of central nerve roots is noted.   L3-L4: A broad-based disc protrusion and moderate facet hypertrophy at L3-L4 similar to the prior exam. Moderate foraminal stenosis is stable, right greater than left.   L4-L5: Moderate subarticular and foraminal stenosis demonstrate slight progression bilaterally.   L5-S1: A disc protrusion and asymmetric advanced left-sided facet hypertrophy is present. Moderate left subarticular and foraminal stenosis is similar to the prior study.   IMPRESSION: 1. 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 2. Moderate subarticular and foraminal stenosis at L4-5 with slight progression bilaterally. 3. Left paramedian disc extrusion at L1-2 with mild left lateral recess and subarticular narrowing, stable. 4. Broad-based disc protrusion and moderate facet hypertrophy at L3-4 with moderate foraminal stenosis, stable, right greater  than left. 5. Status post L4-S1 laminectomy.  ASSESSMENT/PLAN:  Lydia Elliott is is approximately 12 weeks status post L4-S1  laminectomy.  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  She did have a recent injection which helped make these more tolerable.  However she is having an acute exacerbation of left-sided pain that is new.  This is located in her groin and sometimes radiates down the anterior thigh.  She has difficulty bearing weight as it causes severe pain.  Certain sitting positions cause severe pain.  On physical examination he has severe pain to internal rotation and palpation of the lateral trochanteric area.  Referral was made to orthopedic surgery.  From a neuropathic standpoint I did let her know that she may eventually necessitate a further decompression of the lumbar spine as she does have some residual stenosis noted that L4-5 predominantly on the left.  This is likely the cause of her neuropathic issues but she is responding to injections.  She does have an acute exacerbation of this left hip pain, will look forward to following up with our orthopedic surgery team   Penne MICAEL Sharps, MD Department of Neurosurgery

## 2024-07-21 NOTE — Progress Notes (Signed)
 Orthopaedic Surgery New Patient Visit   History of Present Illness: The patient is a 73 y.o. female seen in clinic for 5-day history of left groin pain and anterior thigh pain. Denies any inciting trauma or known injury. Patient also with left buttocks and lateral thigh pain that will radiate down the posterior aspect of her leg and into her left foot and toes.  Patient has prescription tramadol , methocarbamol , gabapentin , and Celebrex  with minimal symptom improvement (patient with concomitant psoriatic arthritis and fibromyalgia). Symptoms began 2 days after lumbar epidural steroid injection.  Describes as an insurance account manager.  Patient treated by Dr. Morene Falcon as with [facility].  Patient referred by Lyle Butters PA-C with [facility] health neurosurgery at [facility].  Underwent a left L4-L5 and L5-S1 transforaminal epidural injection under fluoroscopic guidance.  Being treated for lumbar radiculitis and lumbar stenosis with neurogenic claudication.  Patient had undergone a left L4-S1 laminectomy on 04/15/2024 by Dr. Penne Sharps.  Referred to PM&R due to persistent left L5 radicular symptoms.  Patient states she received 3 weeks of symptom improvement status post lumbar surgery, but then pain began to return.  Patient states current pain is more severe than prior to surgery.  Patient with no previous history of evaluation of left hip pain.    Patient accompanied to today's appointment by daughter, Sueanne.   Past Medical, Social and Family History: Past Medical History:  Diagnosis Date   Anemia    Anxiety    Aortic atherosclerosis    Arthritis    Bone island 10/15/2016   a.) RIGHT humeral head   CAD (coronary artery disease)    a.) 2010 --> PCI with stent placement x 1 (type and location unknown)   Cataract    Depression    GERD (gastroesophageal reflux disease)    Hyperlipidemia    Hypertension    IBS (irritable bowel syndrome)    Insomnia     a.) on hypnotic PRN (zolpidem )   Long-term current use of immunosuppressive biologic agent    a.) on TNF blocker (adalimumab ) for psoriatic arthritis   Nodule of right lung 10/18/2016   Osteoporosis 11/15/2022   Psoriatic arthritis (HCC)    a. Tx'd with adalimumab    Septic joint of right knee joint (HCC) 12/16/2021   Spinal stenosis of lumbar region with neurogenic claudication    Takotsubo cardiomyopathy    Thalassemia    Vertigo    Vitamin D  deficiency 11/15/2022   Wears dentures    partial lower   Past Surgical History:  Procedure Laterality Date   APPENDECTOMY  08/12/1965   CARPAL TUNNEL RELEASE  08/12/2005   CATARACT EXTRACTION  08/13/2011   CHOLECYSTECTOMY  08/12/1998   COLONOSCOPY  03/11/2014   Dr.Wohl   COLONOSCOPY WITH PROPOFOL  N/A 12/11/2015   Procedure: COLONOSCOPY WITH PROPOFOL ;  Surgeon: Rogelia Copping, MD;  Location: [facility];  Service: Endoscopy;  Laterality: N/A;  DESCENDING COLON POLYP    CORONARY ANGIOPLASTY WITH STENT PLACEMENT  08/12/2008   DECOMPRESSIVE LUMBAR LAMINECTOMY LEVEL 2 Left 04/15/2024   Procedure: Left side, lumbar laminectomy, medial facetectomy, foraminotomies.  L4-S1.;  Surgeon: Sharps Penne ORN, MD;  Location: [facility];  Service: Neurosurgery;  Laterality: Left;   ESOPHAGOGASTRODUODENOSCOPY  02/21/2014   Dr.Wohl   ESOPHAGOGASTRODUODENOSCOPY (EGD) WITH PROPOFOL  N/A 12/11/2015   Procedure: ESOPHAGOGASTRODUODENOSCOPY (EGD) WITH PROPOFOL ;  Surgeon: Rogelia Copping, MD;  Location: [facility];  Service: Endoscopy;  Laterality: N/A;   INCISION AND DRAINAGE Right 12/17/2021   Procedure:  INCISION AND DRAINAGE;  Surgeon: Marchia Drivers, MD;  Location: [facility];  Service: Orthopedics;  Laterality: Right;   KNEE ARTHROSCOPY Right 10/04/2021   Procedure: ARTHROSCOPY KNEE;  Surgeon: Marchia Drivers, MD;  Location: [facility];  Service: Orthopedics;  Laterality: Right;   KNEE ARTHROSCOPY Right 10/30/2021   Procedure: ARTHROSCOPY KNEE, I&D OF  SUPERFICIAL ABSCESS;  Surgeon: Marchia Drivers, MD;  Location: [facility];  Service: Orthopedics;  Laterality: Right;   ORIF HUMERUS FRACTURE Left 11/14/2022   Procedure: OPEN REDUCTION INTERNAL FIXATION (ORIF) PROXIMAL HUMERUS FRACTURE;  Surgeon: Celena Sharper, MD;  Location: [facility];  Service: Orthopedics;  Laterality: Left;   POLYPECTOMY N/A 12/11/2015   Procedure: POLYPECTOMY;  Surgeon: Rogelia Copping, MD;  Location: [facility];  Service: Endoscopy;  Laterality: N/A;   TONSILLECTOMY     TONSILLECTOMY AND ADENOIDECTOMY  08/13/1967   Allergies  Allergen Reactions   Rosuvastatin     Muscle Pain   Current Outpatient Medications on File Prior to Visit  Medication Sig Dispense Refill   adalimumab  (HUMIRA ) 40 MG/0.4ML pen Inject 40 mg into the skin every 14 (fourteen) days.     alendronate (FOSAMAX) 70 MG tablet Take 70 mg by mouth every Sunday.     atorvastatin  (LIPITOR) 20 MG tablet Take 20 mg by mouth at bedtime.     carvedilol  (COREG ) 3.125 MG tablet Take 3.125 mg by mouth 2 (two) times daily with a meal.     celecoxib  (CELEBREX ) 200 MG capsule Take 1 capsule (200 mg total) by mouth daily. 30 capsule 2   diclofenac Sodium (VOLTAREN) 1 % GEL Apply 1 application topically 2 (two) times daily as needed (pain).     escitalopram  (LEXAPRO ) 20 MG tablet TAKE 1 TABLET BY MOUTH EVERY DAY (Patient taking differently: Take 20 mg by mouth at bedtime.) 90 tablet 3   gabapentin  (NEURONTIN ) 100 MG capsule Take 1 capsule in the morning, 1 capsule in the afternoon, and 3 capsules at bedtime 150 capsule 0   losartan  (COZAAR ) 25 MG tablet Take 25 mg by mouth at bedtime.     methocarbamol  (ROBAXIN ) 500 MG tablet Take 1 tablet (500 mg total) by mouth every 8 (eight) hours as needed for muscle spasms. 120 tablet 1   omeprazole (PRILOSEC) 20 MG capsule Take 20 mg by mouth daily.     traMADol  (ULTRAM ) 50 MG tablet Take 1 tablet (50 mg total) by mouth every 6 (six) hours as needed for severe pain (pain score  7-10). 28 tablet 0   zolpidem  (AMBIEN ) 10 MG tablet Take 5 mg by mouth at bedtime as needed for sleep.     No current facility-administered medications on file prior to visit.   Social History   Tobacco Use   Smoking status: Former    Current packs/day: 0.00    Average packs/day: 0.5 packs/day for 1 year (0.5 ttl pk-yrs)    Types: Cigarettes    Start date: 74    Quit date: 26    Years since quitting: 53.9   Smokeless tobacco: Never   Tobacco comments:    smoked occasionally  Vaping Use   Vaping status: Never Used  Substance Use Topics   Alcohol use: Yes    Alcohol/week: 1.0 standard drink of alcohol    Types: 1 Shots of liquor per week    Comment: socially   Drug use: No      I have reviewed past medical, surgical, social and family history, medications and allergies as documented in the EMR.  Review of Systems - A ROS was performed including pertinent positives and negatives as documented in the HPI.     Physical Exam:  General/Constitutional: NAD Vascular: No edema, swelling or tenderness, except as noted in detailed exam Integumentary: No impressive skin lesions present, except as noted in detailed exam Neuro/Psych: Normal mood and affect, oriented to person, place and time Musculoskeletal: Normal, except as noted in detailed exam and in HPI  Patient moving frequently about the examination table on encounter. Voluntarily moving seated with left leg crossed and flexed onto the right leg as well as laying down on the lateral right thigh.  Mild midline lumbar spine tenderness. No paraspinal tenderness. No SI joint tenderness. Significant point tenderness with palpation over left buttocks/piriformis region. Positive left straight leg raise.   Positive Stinchfield. Patient unable to fully straighten left leg at knee when lying flat without significant pain.   Focused Orthopaedic Examination:  Left Hip Examination (focused):  No obvious deformity noted about  the left hip. While seated, patient tolerates internal and external rotation about the leg with the hip flexed without discomfort in the groin and anterior thigh. She localizes pain to buttock region and posterior thigh. Tenderness to palpation about the greater trochanter and IT band.    LEFT  ROM (degrees): Extension - Flexion 15-120    Abduction 40   Adduction 20    IR 30    ER 50   Palpation (pain): Anterior negative   Iliopsoas negative   Greater troch positive   ASIS positive   AIIS negative   Posterior positive   Adductors negative  Special Tests: FADIR positive   FABER negative   Log Roll negative   Ober's positive  Other: Hip flexion strength 5/5   Hip adductor strength  5/5   Hip abductor strength 5/5    Vascular/Lymphatic: 2+ dorsalis pedis/posterior tibialis pulses, foot warm and well perfused Neurologic: Sensation intact to light touch to Superficial peroneal/Deep peroneal/Tibial/Sural/Saphenous nerves    XR Left Hip Imaging: X-rays of the Left Hip including 2 views (pelvis AP and hip AP) obtained today 07/21/2024 at [facility] were reviewed personally by me and with patient.  Per my independent interpretation these images show mild to moderate left hip degenerative changes, most prominent along the inferior aspect of the joint.  No acute fracture.  No dislocation.    Radiology Read:  Lumbar Spine MRI 06/29/2024 IMPRESSION: 1. 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 2. Moderate subarticular and foraminal stenosis at L4-5 with slight progression bilaterally. 3. Left paramedian disc extrusion at L1-2 with mild left lateral recess and subarticular narrowing, stable. 4. Broad-based disc protrusion and  moderate facet hypertrophy at L3-4 with moderate foraminal stenosis, stable, right greater than left. 5. Status post L4-S1 laminectomy.  Assessment:  Left hip osteoarthritis Left-sided lumbar radiculopathy  Plan:  Patient was seen and examined in office today. We reviewed patient's history, examination, and imaging in detail. Based on information available for this encounter, patient with new onset left hip pain.  Pain is diffuse; located in the groin, lateral and anterior thigh; however, patient admits primarily source of pain located in the left buttocks with radiating pain down the posterior thigh propagating into the distal extremity.  On physical exam, patient tolerates movement that stresses the left hip joint capsule, very minimal pain with  FADIR and no pain with FABER or log roll. Positive straight leg raise.  Left hip radiographs performed in office today reveals mild to moderate degenerative changes about the left hip joint.  Discussed symptomatology and suspected diagnosis as well as next steps with patient and patient's daughter.  Her complaints of left groin and anterior thigh pain may correlate with hip joint osteoarthritis. However, lateral and posterior based pain, which seems to be more symptomatic for the patient is less likely to be derived from the hip joint. From orthopedic standpoint, consideration of referral to pain management for intra-articular hip injection may provide diagnostic and therapeutic relief.  Patient appears too uncomfortable to want to consider this option.  She also seems convinced that her back is the primary source of her issues.  Explained that we would be happy to help facilitate the referral for hip joint injection if she decides to change her mind.  In the interim, discussed with patient and her daughter that if she develops worsening and persistent groin pain that results in the inability to ambulate, she is to notify the office or present to the emergency  department for further evaluation.  Otherwise, patient may follow-up as needed.   Patient education material was provided.  All questions, concerns and comments were addressed to the best of my ability.  Follow-up: 8 weeks, sooner with any new/worsening symptoms or concerns  I discussed with the patient today that I will be transitioning out of my role within the near future. In order to provide appropriate continuity of care, we offered the patient options for follow up regarding their orthopedic concerns. Patient has chosen to follow up with [facility].  Patient may reach out to our office if there are any difficulties in scheduling follow up care. The patient understands who to contact for future orthopedic concerns and has contact information for the receiving practice.   Arlyss GEANNIE Schneider, DO Orthopedic Surgery & Sports Medicine [place]   This document was dictated using Dragon voice recognition software. A reasonable attempt at proof reading has been made to minimize errors.

## 2024-07-21 NOTE — Addendum Note (Signed)
 Addended by: CLAUDENE PENNE ORN on: 07/21/2024 05:40 PM   Modules accepted: Orders

## 2024-07-24 ENCOUNTER — Encounter: Payer: Self-pay | Admitting: Neurosurgery

## 2024-07-28 ENCOUNTER — Ambulatory Visit: Admitting: Neurosurgery

## 2024-07-28 ENCOUNTER — Other Ambulatory Visit: Payer: Self-pay | Admitting: Family Medicine

## 2024-07-28 DIAGNOSIS — M5416 Radiculopathy, lumbar region: Secondary | ICD-10-CM

## 2024-07-28 DIAGNOSIS — M1612 Unilateral primary osteoarthritis, left hip: Secondary | ICD-10-CM

## 2024-07-29 ENCOUNTER — Ambulatory Visit
Admission: RE | Admit: 2024-07-29 | Discharge: 2024-07-29 | Disposition: A | Source: Ambulatory Visit | Attending: Family Medicine | Admitting: Family Medicine

## 2024-07-29 ENCOUNTER — Ambulatory Visit: Admission: RE | Admit: 2024-07-29 | Source: Ambulatory Visit

## 2024-07-29 ENCOUNTER — Other Ambulatory Visit

## 2024-07-29 DIAGNOSIS — M1612 Unilateral primary osteoarthritis, left hip: Secondary | ICD-10-CM

## 2024-07-29 DIAGNOSIS — M5416 Radiculopathy, lumbar region: Secondary | ICD-10-CM

## 2024-08-03 ENCOUNTER — Ambulatory Visit
Admission: RE | Admit: 2024-08-03 | Discharge: 2024-08-03 | Disposition: A | Discharge status: Home / Self Care | Source: Ambulatory Visit | Attending: Family Medicine | Admitting: Family Medicine

## 2024-08-07 ENCOUNTER — Other Ambulatory Visit

## 2024-08-10 ENCOUNTER — Encounter: Payer: Self-pay | Admitting: Neurosurgery

## 2024-08-11 NOTE — Telephone Encounter (Signed)
 Looks like she is scheduled with Dr Mardee on 08/20/24

## 2024-08-14 ENCOUNTER — Encounter: Payer: Self-pay | Admitting: Neurosurgery

## 2024-08-17 NOTE — Telephone Encounter (Signed)
 Patient scheduled for 08/30/2024 and placed on the wait list.

## 2024-08-30 ENCOUNTER — Ambulatory Visit: Admitting: Neurosurgery

## 2024-08-30 ENCOUNTER — Encounter: Payer: Self-pay | Admitting: Neurosurgery

## 2024-08-30 VITALS — BP 132/64 | Ht 62.0 in | Wt 153.0 lb

## 2024-08-30 DIAGNOSIS — M5416 Radiculopathy, lumbar region: Secondary | ICD-10-CM

## 2024-08-30 DIAGNOSIS — G8918 Other acute postprocedural pain: Secondary | ICD-10-CM

## 2024-08-30 DIAGNOSIS — M48062 Spinal stenosis, lumbar region with neurogenic claudication: Secondary | ICD-10-CM

## 2024-08-30 MED ORDER — GABAPENTIN 100 MG PO CAPS: ORAL_CAPSULE | ORAL | 0 refills | Status: AC

## 2024-08-30 NOTE — Progress Notes (Signed)
 "  REFERRING PHYSICIAN:  Epifanio Alm SQUIBB, Md [address] [place],  [place] [address]  DOS: 04/15/24, [name]  Discussed the use of AI scribe software for clinical note transcription with the patient, who gave verbal consent to proceed.  History of Present Illness BAYLIE DRAKES is a 74 year old female with lumbar spinal stenosis status post laminectomy who presents with persistent right lower extremity neuropathic pain and functional limitations. She has severe neuropathic pain with tingling and paresthesias in the plantar left foot, occasionally radiating to the ankle and proximal leg. Pain worsens with standing and is most pronounced at night and on awakening.  She uses a cane and walker because of frequent falls, impaired proprioception, and limited mobility, which restrict her daily and social activities. She has constant left buttock pain since her initial pain syndrome began, described as an ache or pulling, less severe than before her most recent intervention but still present. She also has lower back aching after prolonged standing. These symptoms have persisted for about one year. She had prior lumbar decompression and nerve injections that improved buttock pain but not right foot paresthesias. MRI of the hip showed arthritis and ligamentous injury. She uses topical diclofenac and is taking gabapentin  100 mg in the morning, at noon, and three capsules at bedtime without adverse effects. She has not tried higher doses of gabapentin . She is concerned about ambulation and maintaining independence, especially with an upcoming trip. She plans to resume swimming and is motivated to start physical therapy, including gait training and aquatic therapy, to improve function.  Her most recent set of injections did help significantly with her pain overall but her paresthesias/anesthesias in her foot continue to be bothersome and interfering with her quality of life.  PHYSICAL EXAMINATION:   General: Patient is well developed, well nourished, calm, collected, and in no apparent distress.   NEUROLOGICAL:  General: In no acute distress.   Awake, alert, oriented to person, place, and time.  Pupils equal round and reactive to light.  Facial tone is symmetric.     Strength:            Side Iliopsoas Quads Hamstring PF DF EHL  R 5 5 5 5 5 5   L 4 5 4+ (pain) 5 4 4     ROS (Neurologic):  Negative except as noted above  IMAGING: EXAM: MRI LUMBAR SPINE 06/29/2024 01:46:00 PM With and Without Intravenous Contrast   TECHNIQUE: Multiplanar multisequence MRI of the lumbar spine was performed with and without the administration of intravenous contrast.   CONTRAST: 7.5 mL of Vueway .   COMPARISON: MR Lumbar Spine without IV contrast 07/18/2023.   CLINICAL HISTORY: Low back pain radiating down left hip, lower extremities and into toes with numbness, status post L4-S1 laminectomy.   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 Type 2 Modic changes are present on the right at L2-L3. Grade 1 degenerative anterolisthesis measures 4 mm, stable. Normal vertebral body heights. Bone marrow signal is unremarkable.   SPINAL CORD: The conus medullaris terminates at L1.   SOFT TISSUES: No paraspinal mass.   L1-L2: A left paramedian disc extrusion is again seen at L1-L2. Disc material extends 2 cm cephalocaudad about the disc space and the left lateral recess. Mild left lateral recess and subarticular narrowing is stable.   L2-L3: A rightward disc protrusion and asymmetric right-sided facet hypertrophy has progressed at L2-L3 with moderate right subarticular and severe right  foraminal stenosis. Moderate left foraminal narrowing is present. Increased T2 signal of central nerve roots is noted.   L3-L4: A broad-based disc protrusion and moderate facet hypertrophy at L3-L4 similar to the prior exam. Moderate foraminal  stenosis is stable, right greater than left.   L4-L5: Moderate subarticular and foraminal stenosis demonstrate slight progression bilaterally.   L5-S1: A disc protrusion and asymmetric advanced left-sided facet hypertrophy is present. Moderate left subarticular and foraminal stenosis is similar to the prior study.   IMPRESSION: 1. 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 2. Moderate subarticular and foraminal stenosis at L4-5 with slight progression bilaterally. 3. Left paramedian disc extrusion at L1-2 with mild left lateral recess and subarticular narrowing, stable. 4. Broad-based disc protrusion and moderate facet hypertrophy at L3-4 with moderate foraminal stenosis, stable, right greater than left. 5. Status post L4-S1 laminectomy.  Assessment and Plan Assessment & Plan Lumbar spinal stenosis with neurogenic claudication Chronic lumbar spinal stenosis with neurogenic claudication, status post L4-S1 laminectomy. She continues to experience persistent buttock pain and difficulty ambulating, requiring assistive devices. Although pain has improved compared to preoperative status, activity-limiting symptoms persist. Her most recent injections did give her improvement, but she does still have some painful dysesthesias in the left foot. She is motivated to improve function in anticipation of major travel in June. Surgical intervention may be considered if symptoms recur, with April as the latest advisable date for surgery prior to travel. - Instructed her to contact physical therapy to initiate gait training and aquatic therapy. - Advised resumption of swimming and water -based therapy to maintain muscle strength. - Emphasized physical therapy focus on gait  retraining and functional mobility. - Scheduled follow-up in one month to reassess symptoms and functional status. - Provided anticipatory guidance regarding possible surgical intervention if symptoms persist, with April as the latest advisable date prior to June travel.  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 Symptoms are chronic and significantly impact quality of life and mobility. Current gabapentin  regimen is subtherapeutic; she reports no significant side effects. Hydrocodone  was previously trialed without efficacy or tolerability. Voltaren gel is used for local pain. Gabapentin  titration is indicated, as she tolerates it well. Risks of increased gabapentin  include sedation, which she has not experienced at current doses. - Increased gabapentin  to 100 mg in the morning, 300 mg in the afternoon, and 300 mg at bedtime for two weeks, then to 300 mg three times daily as tolerated. - Instructed her to monitor for gabapentin  side effects, primarily sedation, and titrate dose as tolerated. - Reinforced that gabapentin  may require up to one week for symptomatic benefit. - Advised continued use of Voltaren gel for local pain as needed. - Scheduled follow-up in one month to assess response to medication adjustment and overall symptom control.  Penne MICAEL Sharps, MD Department of Neurosurgery    "

## 2024-10-06 ENCOUNTER — Ambulatory Visit
